# Patient Record
Sex: Male | Born: 1950
Health system: Southern US, Community
[De-identification: ages and names within clinical notes are randomized; demographics above are authoritative.]

## PROBLEM LIST (undated history)

## (undated) DIAGNOSIS — Z8041 Family history of malignant neoplasm of ovary: Secondary | ICD-10-CM

## (undated) DIAGNOSIS — R17 Unspecified jaundice: Secondary | ICD-10-CM

## (undated) DIAGNOSIS — M25569 Pain in unspecified knee: Secondary | ICD-10-CM

## (undated) DIAGNOSIS — G4733 Obstructive sleep apnea (adult) (pediatric): Secondary | ICD-10-CM

## (undated) DIAGNOSIS — T7840XA Allergy, unspecified, initial encounter: Secondary | ICD-10-CM

## (undated) DIAGNOSIS — E119 Type 2 diabetes mellitus without complications: Secondary | ICD-10-CM

## (undated) DIAGNOSIS — I10 Essential (primary) hypertension: Secondary | ICD-10-CM

## (undated) DIAGNOSIS — M179 Osteoarthritis of knee, unspecified: Secondary | ICD-10-CM

## (undated) DIAGNOSIS — H269 Unspecified cataract: Secondary | ICD-10-CM

## (undated) DIAGNOSIS — I2510 Atherosclerotic heart disease of native coronary artery without angina pectoris: Secondary | ICD-10-CM

## (undated) DIAGNOSIS — R739 Hyperglycemia, unspecified: Secondary | ICD-10-CM

## (undated) DIAGNOSIS — I2584 Coronary atherosclerosis due to calcified coronary lesion: Principal | ICD-10-CM

## (undated) DIAGNOSIS — G4730 Sleep apnea, unspecified: Secondary | ICD-10-CM

## (undated) DIAGNOSIS — D120 Benign neoplasm of cecum: Secondary | ICD-10-CM

## (undated) DIAGNOSIS — B199 Unspecified viral hepatitis without hepatic coma: Secondary | ICD-10-CM

## (undated) DIAGNOSIS — M1710 Unilateral primary osteoarthritis, unspecified knee: Secondary | ICD-10-CM

## (undated) DIAGNOSIS — E785 Hyperlipidemia, unspecified: Secondary | ICD-10-CM

## (undated) DIAGNOSIS — Q438 Other specified congenital malformations of intestine: Secondary | ICD-10-CM

## (undated) HISTORY — DX: Allergy, unspecified, initial encounter: T78.40XA

## (undated) HISTORY — PX: EYE SURGERY: SHX253

## (undated) HISTORY — DX: Hyperlipidemia, unspecified: E78.5

## (undated) HISTORY — DX: Essential (primary) hypertension: I10

## (undated) HISTORY — DX: Pain in unspecified knee: M25.569

## (undated) HISTORY — DX: Unilateral primary osteoarthritis, unspecified knee: M17.10

## (undated) HISTORY — DX: Hyperglycemia, unspecified: R73.9

## (undated) HISTORY — DX: Unspecified cataract: H26.9

## (undated) HISTORY — DX: Atherosclerotic heart disease of native coronary artery without angina pectoris: I25.10

## (undated) HISTORY — DX: Osteoarthritis of knee, unspecified: M17.9

## (undated) HISTORY — DX: Family history of malignant neoplasm of ovary: Z80.41

## (undated) HISTORY — DX: Morbid (severe) obesity due to excess calories: E66.01

## (undated) HISTORY — DX: Benign neoplasm of cecum: D12.0

## (undated) HISTORY — DX: Type 2 diabetes mellitus without complications: E11.9

## (undated) HISTORY — DX: Coronary atherosclerosis due to calcified coronary lesion: I25.84

## (undated) HISTORY — DX: Obstructive sleep apnea (adult) (pediatric): G47.33

## (undated) HISTORY — PX: TONSILLECTOMY: SUR1361

## (undated) HISTORY — DX: Unspecified viral hepatitis without hepatic coma: B19.9

## (undated) HISTORY — DX: Other specified congenital malformations of intestine: Q43.8

## (undated) HISTORY — PX: JOINT REPLACEMENT: SHX530

---

## 1996-02-23 HISTORY — PX: CHOLECYSTECTOMY: SHX55

## 1999-11-20 ENCOUNTER — Encounter: Payer: Self-pay | Admitting: Family Medicine

## 2000-11-04 ENCOUNTER — Encounter: Payer: Self-pay | Admitting: Family Medicine

## 2002-03-16 ENCOUNTER — Encounter: Payer: Self-pay | Admitting: Family Medicine

## 2002-03-16 LAB — CONVERTED CEMR LAB
Blood Glucose, Fasting: 109 mg/dL
PSA: 0.7 ng/mL
RBC count: 5.4 10*6/uL
TSH: 1.48 microintl units/mL

## 2002-04-02 HISTORY — PX: OTHER SURGICAL HISTORY: SHX169

## 2005-01-13 ENCOUNTER — Ambulatory Visit: Payer: Self-pay | Admitting: Family Medicine

## 2005-02-12 ENCOUNTER — Ambulatory Visit: Payer: Self-pay | Admitting: Family Medicine

## 2005-02-12 LAB — CONVERTED CEMR LAB: PSA: 0.84 ng/mL

## 2005-02-17 ENCOUNTER — Ambulatory Visit: Payer: Self-pay | Admitting: Family Medicine

## 2006-01-12 ENCOUNTER — Ambulatory Visit: Payer: Self-pay | Admitting: Family Medicine

## 2006-04-08 ENCOUNTER — Ambulatory Visit: Payer: Self-pay | Admitting: Family Medicine

## 2007-05-23 ENCOUNTER — Ambulatory Visit: Payer: Self-pay | Admitting: Family Medicine

## 2007-05-23 DIAGNOSIS — D239 Other benign neoplasm of skin, unspecified: Secondary | ICD-10-CM | POA: Insufficient documentation

## 2007-05-25 ENCOUNTER — Encounter: Payer: Self-pay | Admitting: Family Medicine

## 2007-06-21 ENCOUNTER — Ambulatory Visit: Payer: Self-pay | Admitting: Family Medicine

## 2007-06-21 DIAGNOSIS — T148XXA Other injury of unspecified body region, initial encounter: Secondary | ICD-10-CM | POA: Insufficient documentation

## 2007-06-30 ENCOUNTER — Ambulatory Visit: Payer: Self-pay | Admitting: Family Medicine

## 2008-02-09 ENCOUNTER — Ambulatory Visit: Payer: Self-pay | Admitting: Family Medicine

## 2008-02-09 LAB — CONVERTED CEMR LAB
ALT: 29 units/L (ref 0–53)
Bilirubin, Direct: 0.1 mg/dL (ref 0.0–0.3)
CO2: 30 meq/L (ref 19–32)
Calcium: 9.2 mg/dL (ref 8.4–10.5)
Chloride: 103 meq/L (ref 96–112)
GFR calc non Af Amer: 92 mL/min
LDL Cholesterol: 85 mg/dL (ref 0–99)
Sodium: 139 meq/L (ref 135–145)
Total Bilirubin: 0.8 mg/dL (ref 0.3–1.2)
Total CHOL/HDL Ratio: 3.2

## 2008-02-15 ENCOUNTER — Ambulatory Visit: Payer: Self-pay | Admitting: Family Medicine

## 2008-02-15 HISTORY — DX: Morbid (severe) obesity due to excess calories: E66.01

## 2008-08-02 ENCOUNTER — Ambulatory Visit: Payer: Self-pay | Admitting: Family Medicine

## 2008-08-02 DIAGNOSIS — M7520 Bicipital tendinitis, unspecified shoulder: Secondary | ICD-10-CM | POA: Insufficient documentation

## 2008-08-02 DIAGNOSIS — M719 Bursopathy, unspecified: Secondary | ICD-10-CM

## 2008-08-02 DIAGNOSIS — M67919 Unspecified disorder of synovium and tendon, unspecified shoulder: Secondary | ICD-10-CM | POA: Insufficient documentation

## 2008-08-02 DIAGNOSIS — M25519 Pain in unspecified shoulder: Secondary | ICD-10-CM | POA: Insufficient documentation

## 2008-08-12 ENCOUNTER — Encounter: Payer: Self-pay | Admitting: Family Medicine

## 2008-09-06 ENCOUNTER — Ambulatory Visit: Payer: Self-pay | Admitting: Family Medicine

## 2009-09-30 ENCOUNTER — Encounter (INDEPENDENT_AMBULATORY_CARE_PROVIDER_SITE_OTHER): Payer: Self-pay | Admitting: *Deleted

## 2010-03-24 NOTE — Letter (Signed)
Summary: Dennis Ayers letter  [place] [facility]  [address] [place], [place] [address]   Phone: [phone]  Fax: [phone]       09/30/2009 MRN: [id number]  Dennis Ayers [address] [place], [place]  [address]  Dear [name] [facility] [place], and [place] announce the retirement of Dennis Ayers, M.D., from full-time practice at the [facility] office effective August 21, 2009 and his plans of returning part-time.  It is important to Dennis Ayers and to our practice that you understand that [facility] has seven physicians in our office for your health care needs.  We will continue to offer the same exceptional care that you have today.    Dennis Ayers has spoken to many of you about his plans for retirement and returning part-time in the fall.   We will continue to work with you through the transition to schedule appointments for you in the office and meet the high standards that [facility] is committed to.   Again, it is with great pleasure that we share the news that Dennis Ayers will return to [facility] in October of 2011 with a reduced schedule.    If you have any questions, or would like to request an appointment with one of our physicians, please call us at [phone] and press the option for Scheduling an appointment.  We take pleasure in providing you with excellent patient care and look forward to seeing you at your next office visit.  Our [facility] are:  Dennis Ayers, M.D. Dennis Ayers, M.D. Dennis Ayers, M.D. Dennis Ayers, M.D. Dennis Ayers, M.D. Dennis Ayers, M.D. We proudly welcomed Dennis Ayers, M.D. and Dennis Ayers, M.D. to the practice in July/August 2011.  Sincerely,  [place] [facility]

## 2011-07-23 ENCOUNTER — Ambulatory Visit (INDEPENDENT_AMBULATORY_CARE_PROVIDER_SITE_OTHER): Payer: 59 | Admitting: Family Medicine

## 2011-07-23 ENCOUNTER — Encounter: Payer: Self-pay | Admitting: Family Medicine

## 2011-07-23 VITALS — BP 130/74 | HR 72 | Temp 97.9°F | Ht 75.0 in | Wt 353.0 lb

## 2011-07-23 DIAGNOSIS — Z0000 Encounter for general adult medical examination without abnormal findings: Secondary | ICD-10-CM

## 2011-07-23 DIAGNOSIS — R2232 Localized swelling, mass and lump, left upper limb: Secondary | ICD-10-CM

## 2011-07-23 DIAGNOSIS — M25569 Pain in unspecified knee: Secondary | ICD-10-CM

## 2011-07-23 DIAGNOSIS — M179 Osteoarthritis of knee, unspecified: Secondary | ICD-10-CM

## 2011-07-23 DIAGNOSIS — Z125 Encounter for screening for malignant neoplasm of prostate: Secondary | ICD-10-CM

## 2011-07-23 DIAGNOSIS — Z1211 Encounter for screening for malignant neoplasm of colon: Secondary | ICD-10-CM

## 2011-07-23 DIAGNOSIS — M1710 Unilateral primary osteoarthritis, unspecified knee: Secondary | ICD-10-CM | POA: Insufficient documentation

## 2011-07-23 DIAGNOSIS — M25562 Pain in left knee: Secondary | ICD-10-CM

## 2011-07-23 DIAGNOSIS — Z2911 Encounter for prophylactic immunotherapy for respiratory syncytial virus (RSV): Secondary | ICD-10-CM

## 2011-07-23 DIAGNOSIS — M25561 Pain in right knee: Secondary | ICD-10-CM

## 2011-07-23 HISTORY — DX: Osteoarthritis of knee, unspecified: M17.9

## 2011-07-23 LAB — COMPREHENSIVE METABOLIC PANEL
Albumin: 4.2 g/dL (ref 3.5–5.2)
Alkaline Phosphatase: 56 U/L (ref 39–117)
BUN: 17 mg/dL (ref 6–23)
GFR: 87.58 mL/min (ref >60.00)
Glucose, Bld: 106 mg/dL — ABNORMAL HIGH (ref 70–99)
Potassium: 4.9 mEq/L (ref 3.5–5.1)

## 2011-07-23 LAB — POC HEMOCCULT BLD/STL (OFFICE/1-CARD/DIAGNOSTIC): Fecal Occult Blood, POC: NEGATIVE

## 2011-07-23 LAB — LIPID PANEL
Cholesterol: 164 mg/dL (ref 0–200)
HDL: 45.4 mg/dL (ref >39.00)
VLDL: 13.6 mg/dL (ref 0.0–40.0)

## 2011-07-23 LAB — PSA: PSA: 1.11 ng/mL (ref 0.10–4.00)

## 2011-07-23 MED ORDER — NAPROXEN 500 MG PO TABS: 500.0000 mg | ORAL_TABLET | Freq: Two times a day (BID) | ORAL | Status: DC | PRN

## 2011-07-23 NOTE — Assessment & Plan Note (Signed)
Anticipate arthritis. Limiting ability to exercise Treat with NSAID.  No h/o kidney disease, no h/o GI intolerance to NSAIDs. If not better return for xray and possible steroid injection.

## 2011-07-23 NOTE — Assessment & Plan Note (Signed)
Reviewed preventative protocols and updated unless pt declined. Shingles shot today. Schedule colonscopy. Prostate exam normal today. Discussed healthy diet and lifestyle.  Encouraged weight loss.

## 2011-07-23 NOTE — Progress Notes (Signed)
Addended by: Josph Macho A on: 07/23/2011 12:27 PM   Modules accepted: Orders

## 2011-07-23 NOTE — Assessment & Plan Note (Signed)
Not bothersome. Discussed likely trigger finger nodule but asxs. Pt would like to monitor for now, will update me if sxs worsening.

## 2011-07-23 NOTE — Progress Notes (Signed)
Subjective:    Patient ID: Dennis Ayers, male    DOB: [birth year], 61 y.o.   MRN: [id number]  HPI CC: CPE  Last seen 2010.  Has knot on left hand.  Not bothering him.  No locking of fingers.  Continued knee pain - takes osteo bi-flex.  Also uses knee salve.  Used to take ibuprofen but not recently.  No instability or locking.  + popping.  L>R pain.  L knee injury 20+ yrs ago water skiing, twisted knee.  Trouble exercising 2/2 knee pain.  No h/o knee surgeries.  Wt Readings from Last 3 Encounters:  07/23/11 353 lb (160.12 kg)  09/06/08 322 lb 9.6 oz (146.33 kg)  08/02/08 333 lb (151.048 kg)   Preventative: No recent CPE Colon screening - would like to schedule colonoscopy Prostate screening - would like today. Fasting today. Tetanus 2004.  Wants shingles shot today.  States has discussed with insurance re shot. Seatbelt and sunscreen use discussed.  Caffeine: 2-3 diet sodas/day Lives with wife 1 grown son. Occupation: Self-employed; Administrator Activity: some golf, limited by knee pain Diet: good water, daily vegetables  Medications and allergies reviewed and updated in chart.  Past histories reviewed and updated if relevant as below. Patient Active Problem List  Diagnoses  . NEVI, MULTIPLE  . OBESITY, UNSPECIFIED  . SHOULDER PAIN, RIGHT  . ROTATOR CUFF SYNDROME, RIGHT  . BICEPS TENDINITIS, RIGHT  . HYPERGLYCEMIA  . LACERATION   Past Medical History  Diagnosis Date  . Bicipital tenosynovitis   . Other abnormal glucose   . Benign neoplasm of skin, site unspecified   . Obesity, unspecified   . Disorders of bursae and tendons in shoulder region, unspecified    Past Surgical History  Procedure Date  . 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 Dorene Grebe  . Cholecystectomy 1998   History  Substance Use Topics  . Smoking status: Never Smoker   . Smokeless tobacco: Never Used  . Alcohol Use: No   Family History  Problem Relation Age of  Onset  . Coronary artery disease Father     CABG X4  . Hypertension Father   . Ovarian cancer Mother   . Ulcers Mother     stomach  . Diabetes Neg Hx   . Prostate cancer Neg Hx   . Breast cancer Neg Hx   . Colon cancer Neg Hx   . Depression Neg Hx   . Alcohol abuse Neg Hx   . Stroke Father    No Known Allergies No current outpatient prescriptions on file prior to visit.    Review of Systems  Constitutional: Negative for fever, chills, activity change, appetite change, fatigue and unexpected weight change.  HENT: Negative for hearing loss and neck pain.   Eyes: Negative for visual disturbance.  Respiratory: Negative for cough, chest tightness, shortness of breath and wheezing.   Cardiovascular: Negative for chest pain, palpitations and leg swelling.  Gastrointestinal: Negative for nausea, vomiting, abdominal pain, diarrhea, constipation, blood in stool and abdominal distention.  Genitourinary: Negative for hematuria and difficulty urinating.  Musculoskeletal: Negative for myalgias and arthralgias.  Skin: Negative for rash.  Neurological: Negative for dizziness, seizures, syncope and headaches.  Hematological: Does not bruise/bleed easily.  Psychiatric/Behavioral: Negative for dysphoric mood. The patient is not nervous/anxious.        Objective:   Physical Exam  Nursing note and vitals reviewed. Constitutional: He is oriented to person, place, and time. He appears well-developed and well-nourished.  No distress.       obese  HENT:  Head: Normocephalic and atraumatic.  Right Ear: Hearing, tympanic membrane, external ear and ear canal normal.  Left Ear: Hearing, tympanic membrane, external ear and ear canal normal.  Nose: Nose normal. No mucosal edema or rhinorrhea.  Mouth/Throat: Uvula is midline, oropharynx is clear and moist and mucous membranes are normal. No oropharyngeal exudate, posterior oropharyngeal edema, posterior oropharyngeal erythema or tonsillar abscesses.    Eyes: Conjunctivae and EOM are normal. Pupils are equal, round, and reactive to light. No scleral icterus.  Neck: Normal range of motion. Neck supple. No thyromegaly present.  Cardiovascular: Normal rate, regular rhythm, normal heart sounds and intact distal pulses.   No murmur heard. Pulses:      Radial pulses are 2+ on the right side, and 2+ on the left side.  Pulmonary/Chest: Effort normal and breath sounds normal. No respiratory distress. He has no wheezes. He has no rales.  Abdominal: Soft. Bowel sounds are normal. He exhibits no distension and no mass. There is no tenderness. There is no rebound and no guarding.  Musculoskeletal: Normal range of motion. He exhibits no edema.       L palmar hand with nontender nodule at 3rd flexor tendon  Bilateral knees FROM, no significant crepitus, no effusion, no pain to palpation at ligaments or joint line. Neg mcmurray.  Lymphadenopathy:    He has no cervical adenopathy.  Neurological: He is alert and oriented to person, place, and time.       CN grossly intact, station and gait intact  Skin: Skin is warm and dry. No rash noted.  Psychiatric: He has a normal mood and affect. His behavior is normal. Judgment and thought content normal.      Assessment & Plan:

## 2011-07-23 NOTE — Patient Instructions (Signed)
Shingles shot today. Pass by [facility]'s office to schedule colonosocpy. You likely have arthritis in knees. Take naprosyn as needed for pain - take with food. Blood work today.

## 2011-08-06 ENCOUNTER — Ambulatory Visit (AMBULATORY_SURGERY_CENTER): Payer: 59

## 2011-08-06 VITALS — Ht 75.0 in | Wt 357.8 lb

## 2011-08-06 DIAGNOSIS — Z1211 Encounter for screening for malignant neoplasm of colon: Secondary | ICD-10-CM

## 2011-08-06 MED ORDER — MOVIPREP 100 G PO SOLR: 1.0000 | Freq: Once | ORAL | Status: DC

## 2011-08-09 ENCOUNTER — Encounter: Payer: Self-pay | Admitting: Internal Medicine

## 2011-08-19 ENCOUNTER — Other Ambulatory Visit: Payer: Self-pay

## 2011-08-20 ENCOUNTER — Encounter (HOSPITAL_COMMUNITY): Payer: Self-pay | Admitting: *Deleted

## 2011-08-20 ENCOUNTER — Ambulatory Visit (HOSPITAL_COMMUNITY)
Admission: RE | Admit: 2011-08-20 | Discharge: 2011-08-20 | Disposition: A | Discharge status: Home / Self Care | Payer: 59 | Source: Ambulatory Visit | Attending: Internal Medicine | Admitting: Internal Medicine

## 2011-08-20 ENCOUNTER — Encounter: Payer: 59 | Admitting: Internal Medicine

## 2011-08-20 ENCOUNTER — Encounter (HOSPITAL_COMMUNITY)
Admission: RE | Disposition: A | Discharge status: Home / Self Care | Payer: Self-pay | Source: Ambulatory Visit | Attending: Internal Medicine

## 2011-08-20 DIAGNOSIS — G4730 Sleep apnea, unspecified: Secondary | ICD-10-CM | POA: Insufficient documentation

## 2011-08-20 DIAGNOSIS — Z860100 Personal history of colon polyps, unspecified: Secondary | ICD-10-CM | POA: Diagnosis present

## 2011-08-20 DIAGNOSIS — Z1211 Encounter for screening for malignant neoplasm of colon: Secondary | ICD-10-CM

## 2011-08-20 DIAGNOSIS — D126 Benign neoplasm of colon, unspecified: Secondary | ICD-10-CM | POA: Diagnosis present

## 2011-08-20 HISTORY — DX: Sleep apnea, unspecified: G47.30

## 2011-08-20 HISTORY — DX: Unspecified jaundice: R17

## 2011-08-20 HISTORY — PX: COLONOSCOPY: SHX5424

## 2011-08-20 SURGERY — COLONOSCOPY
Anesthesia: Moderate Sedation

## 2011-08-20 MED ORDER — MIDAZOLAM HCL 10 MG/2ML IJ SOLN
INTRAMUSCULAR | Status: AC
  Filled 2011-08-20: qty 4

## 2011-08-20 MED ORDER — FENTANYL CITRATE 0.05 MG/ML IJ SOLN
INTRAMUSCULAR | Status: DC | PRN
  Administered 2011-08-20 (×2): 25 ug via INTRAVENOUS

## 2011-08-20 MED ORDER — MIDAZOLAM HCL 10 MG/2ML IJ SOLN
INTRAMUSCULAR | Status: DC | PRN
  Administered 2011-08-20 (×2): 2 mg via INTRAVENOUS

## 2011-08-20 MED ORDER — DIPHENHYDRAMINE HCL 50 MG/ML IJ SOLN
INTRAMUSCULAR | Status: AC
  Filled 2011-08-20: qty 1

## 2011-08-20 MED ORDER — SODIUM CHLORIDE 0.9 % IV SOLN
INTRAVENOUS | Status: DC
  Administered 2011-08-20: 500 mL via INTRAVENOUS

## 2011-08-20 MED ORDER — FENTANYL CITRATE 0.05 MG/ML IJ SOLN
INTRAMUSCULAR | Status: AC
  Filled 2011-08-20: qty 4

## 2011-08-20 NOTE — Op Note (Signed)
[facility] [address] [place], [place]  [address]  COLONOSCOPY PROCEDURE REPORT  PATIENT:  Dennis Ayers, Dennis Ayers  MR#:  [id number] BIRTHDATE:  11/04/1950, 60 yrs. old  GENDER:  male ENDOSCOPIST:  Iva Boop, MD, [facility] REF. BY:  Eustaquio Boyden, M.D. PROCEDURE DATE:  08/20/2011 PROCEDURE:  Colonoscopy with snare polypectomy ASA CLASS:  Class III INDICATIONS:  Routine Risk Screening, Screening MEDICATIONS:   Fentanyl 50 mcg IV, Versed 4 mg IV  DESCRIPTION OF PROCEDURE:   After the risks benefits and alternatives of the procedure were thoroughly explained, informed consent was obtained.  Digital rectal exam was performed and revealed no abnormalities and normal prostate.   The Pentax Colonoscope [id number] endoscope was introduced through the anus and advanced to the cecum, which was identified by both the appendix and ileocecal valve, without limitations.  The quality of the prep was good, using MoviPrep.  The instrument was then slowly withdrawn as the colon was fully examined. <<PROCEDUREIMAGES>>  FINDINGS:  Four polyps were found in the transverse colon. All diminutive,, 3-5 mm and removed with cold snare, sent to pathology.  This was otherwise a normal examination of the colon. Retroflexed views in the rectum revealed no abnormalities.    The time to cecum = 17 (required position change to prone after supine and multiple pressure applications failed) minutes. The scope was then withdrawn in 11 minutes from the cecum and the procedure completed. COMPLICATIONS:  None ENDOSCOPIC IMPRESSION: 1) Four diminutive polyps removed 2) Otherwise normal exam, good prep  REPEAT EXAM:  In for Colonoscopy, pending biopsy results.  Iva Boop, MD, [facility]  CC:  Eustaquio Boyden MD and The Patient  n. eSIGNED:   Iva Boop at 08/20/2011 01:23 PM  Sudie Bailey, [phone]

## 2011-08-20 NOTE — Discharge Instructions (Addendum)
There were four small and benign-appearing polyps removed. I will send you a letter about what type of polyps they were and what that means as far as next routine screening test (timing). Thanks for choosing [facility].  Iva Boop, MD, [facility]   Colonoscopy Care After Read the instructions outlined below and refer to this sheet in the next few weeks. These discharge instructions provide you with general information on caring for yourself after you leave the hospital. Your doctor may also give you specific instructions. While your treatment has been planned according to the most current medical practices available, unavoidable complications occasionally occur. If you have any problems or questions after discharge, call your doctor. HOME CARE INSTRUCTIONS ACTIVITY:  You may resume your regular activity, but move at a slower pace for the next 24 hours.   Take frequent rest periods for the next 24 hours.   Walking will help get rid of the air and reduce the bloated feeling in your belly (abdomen).   No driving for 24 hours (because of the medicine (anesthesia) used during the test).   You may shower.   Do not sign any important legal documents or operate any machinery for 24 hours (because of the anesthesia used during the test).  NUTRITION:  Drink plenty of fluids.   You may resume your normal diet as instructed by your doctor.   Begin with a light meal and progress to your normal diet. Heavy or fried foods are harder to digest and may make you feel sick to your stomach (nauseated).   Avoid alcoholic beverages for 24 hours or as instructed.  MEDICATIONS:  You may resume your normal medications unless your doctor tells you otherwise.  WHAT TO EXPECT TODAY:  Some feelings of bloating in the abdomen.   Passage of more gas than usual.   Spotting of blood in your stool or on the toilet paper.  IF YOU HAD POLYPS REMOVED DURING THE COLONOSCOPY:  No aspirin products  for 7 days or as instructed.   No alcohol for 7 days or as instructed.   Eat a soft diet for the next 24 hours.  FINDING OUT THE RESULTS OF YOUR TEST Not all test results are available during your visit. If your test results are not back during the visit, make an appointment with your caregiver to find out the results. Do not assume everything is normal if you have not heard from your caregiver or the medical facility. It is important for you to follow up on all of your test results.  SEEK IMMEDIATE MEDICAL CARE IF:  You have more than a spotting of blood in your stool.   Your belly is swollen (abdominal distention).   You are nauseated or vomiting.   You have a fever.   You have abdominal pain or discomfort that is severe or gets worse throughout the day.  Document Released: 09/23/2003 Document Revised: 01/28/2011 Document Reviewed: 09/21/2007 [facility] Patient Information 2012 [place], [place].

## 2011-08-20 NOTE — H&P (Signed)
  Cc:  Screening colonoscopy  HPI:  No active GI complaints  No Known Allergies @ENCMEDSTART @ Past Medical History  Diagnosis Date  . Hyperglycemia   . Benign neoplasm of skin, site unspecified   . Morbid obesity   . Knee pain   . Jaundice     when very young, treated and resolved  . Hepatitis     has had jaundice in past, unsure of cause  . Sleep apnea     no CiPAP   Past Surgical History  Procedure Date  . 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 Dorene Grebe  . Cholecystectomy 1998  . Tonsillectomy    History   Social History  . Marital Status: Married    Spouse Name: N/A    Number of Children: 1  . Years of Education: N/A   Occupational History  . Self-employed    Social History Main Topics  . Smoking status: Never Smoker   . Smokeless tobacco: Never Used  . Alcohol Use: No     occasionally in past, not anymore for 20 years  . Drug Use: No  . Sexually Active: None   Other Topics Concern  . None   Social History Narrative   Caffeine: 2-3 diet sodas/dayLives with wife1 grown son.Occupation: Self-employed; Financial risk analyst: some golf, limited by knee painDiet: good water, daily vegetables   Family History  Problem Relation Age of Onset  . Coronary artery disease Father     CABG X4  . Hypertension Father   . Ovarian cancer Mother   . Ulcers Mother     stomach  . Diabetes Neg Hx   . Prostate cancer Neg Hx   . Breast cancer Neg Hx   . Colon cancer Neg Hx   . Depression Neg Hx   . Alcohol abuse Neg Hx   . Stroke Father       PE:  WDWN, obese abdomen Lungs are clear Heart normal S1S2 no murmur Abd is soft obese and nontender Ext no edema  A and O x 3  Ass/plan:  Routine risk screening colonoscopy - for colorectal cancer  The risks, benefits, and alternatives to endoscopy with possible biopsy and possible dilation were discussed with the patient and they consent to proceed.

## 2011-08-24 ENCOUNTER — Encounter: Payer: Self-pay | Admitting: Family Medicine

## 2011-08-25 ENCOUNTER — Encounter: Payer: Self-pay | Admitting: Internal Medicine

## 2011-09-21 ENCOUNTER — Other Ambulatory Visit: Payer: Self-pay | Admitting: Family Medicine

## 2011-11-19 ENCOUNTER — Ambulatory Visit (INDEPENDENT_AMBULATORY_CARE_PROVIDER_SITE_OTHER)
Admission: RE | Admit: 2011-11-19 | Discharge: 2011-11-19 | Disposition: A | Discharge status: Home / Self Care | Payer: 59 | Source: Ambulatory Visit | Attending: Family Medicine | Admitting: Family Medicine

## 2011-11-19 ENCOUNTER — Ambulatory Visit (INDEPENDENT_AMBULATORY_CARE_PROVIDER_SITE_OTHER): Payer: 59 | Admitting: Family Medicine

## 2011-11-19 ENCOUNTER — Encounter: Payer: Self-pay | Admitting: Family Medicine

## 2011-11-19 VITALS — BP 136/76 | HR 85 | Temp 97.6°F | Wt 357.0 lb

## 2011-11-19 DIAGNOSIS — M25529 Pain in unspecified elbow: Secondary | ICD-10-CM

## 2011-11-19 DIAGNOSIS — M25569 Pain in unspecified knee: Secondary | ICD-10-CM

## 2011-11-19 DIAGNOSIS — M1710 Unilateral primary osteoarthritis, unspecified knee: Secondary | ICD-10-CM

## 2011-11-19 DIAGNOSIS — M25522 Pain in left elbow: Secondary | ICD-10-CM

## 2011-11-19 DIAGNOSIS — M25562 Pain in left knee: Secondary | ICD-10-CM

## 2011-11-19 MED ORDER — NAPROXEN 500 MG PO TABS: 500.0000 mg | ORAL_TABLET | Freq: Two times a day (BID) | ORAL | Status: DC

## 2011-11-19 NOTE — Assessment & Plan Note (Addendum)
Anticipate OA - xrays today given longstanding knee pain to eval arthritic burden  --> medial compartment degeneration evident on my read. Discussed options - pt agrees to steroid injection Handicap placard (temporary) filled out 2/2 inability to walk more than 200 ft without stopping to rest.  IC obtained and in chart.  Area cleaned with alcohol pad and ethyl chloride used to anesthetize area.  Using lateral approach with knee flexed, 4cc of 1:4 depo medrol/lidocaine 1% injected into knee using 1 1/2 inch 22g needle.  Pt tolerated procedure well.  Discussed red flags to seek care over weekend after steroid injection.

## 2011-11-19 NOTE — Progress Notes (Signed)
  Subjective:    Patient ID: Dennis Ayers, male    DOB: 04-02-1950, 62 y.o.   MRN: [id number]  HPI CC: knee pain, elbow pain  Longstanding h/o knee pain - recently worsening.  takes osteo bi-flex. Also uses knee salve. Used to take ibuprofen but not recently. No instability or locking. + popping. L>R pain. L knee injury 20+ yrs ago water skiing, twisted knee. Trouble exercising 2/2 knee pain.  Mild swelling.  Pain anterior upper knee.  No h/o knee surgeries. No fevers/chills, redness or warmth of joint. Naprosyn started, some improvement but still pain present. Osteo biflex hasn't really helped.  L elbow pain - at tip of elbow.  Very sore to touch.  Naprosyn did help some.  No significant burning or numbness of arm.  Past Medical History  Diagnosis Date  . Hyperglycemia   . Benign neoplasm of skin, site unspecified   . Morbid obesity   . Knee pain   . Jaundice     when very young, treated and resolved  . Hepatitis     has had jaundice in past, unsure of cause  . Sleep apnea     no CiPAP   There is no height on file to calculate BMI.  Wt Readings from Last 3 Encounters:  11/19/11 357 lb (161.934 kg)  08/20/11 350 lb (158.759 kg)  08/20/11 350 lb (158.759 kg)    Review of Systems Per HPI    Objective:   Physical Exam  Nursing note and vitals reviewed. Constitutional: He appears well-developed and well-nourished. No distress.  Musculoskeletal: He exhibits no edema.       L elbow - tender to palpation at lat epicondyle.  Tender with resisted extension against resistance at wrist.  Tender with forced pronation at wrist. L knee - tender to palpation medial joint line of knee.  Neg drawer test, no pain with Mcmurray's, no PFgrind or abnormal patellar mobility.  + crepitus with ext/flexion.  FROM at knee. No popliteal fullness. R knee - mildly tender to palpation medial joint line but not as significant as left knee.  Skin: Skin is warm and dry. No rash noted.         Assessment & Plan:

## 2011-11-19 NOTE — Patient Instructions (Addendum)
Knee - steroid injection provided today.  I do think you have arthritis , worse on the inside of left knee.  Watch for redness or warmth or worsening pain after steroid shot - if that happens, seek urgent care. Tennis elbow - recommend stretching exercises as provided.  No heavy lifting for next several weeks - rest elbow.  May use naprosyn as prescribed for inflammation.  Buy elbow strap.  Osteoarthritis Osteoarthritis is the most common form of arthritis. It is redness, soreness, and swelling (inflammation) affecting the cartilage. Cartilage acts as a cushion, covering the ends of bones where they meet to form a joint. CAUSES  Over time, the cartilage begins to wear away. This causes bone to rub on bone. This produces pain and stiffness in the affected joints. Factors that contribute to this problem are:  Excessive body weight.   Age.   Overuse of joints.  SYMPTOMS   People with osteoarthritis usually experience joint pain, swelling, or stiffness.   Over time, the joint may lose its normal shape.   Small deposits of bone (osteophytes) may grow on the edges of the joint.   Bits of bone or cartilage can break off and float inside the joint space. This may cause more pain and damage.   Osteoarthritis can lead to depression, anxiety, feelings of helplessness, and limitations on daily activities.  The most commonly affected joints are in the:  Ends of the fingers.   Thumbs.   Neck.   Lower back.   Knees.   Hips.  DIAGNOSIS  Diagnosis is mostly based on your symptoms and exam. Tests may be helpful, including:  X-rays of the affected joint.   A computerized magnetic scan (MRI).   Blood tests to rule out other types of arthritis.   Joint fluid tests. This involves using a needle to draw fluid from the joint and examining the fluid under a microscope.  TREATMENT  Goals of treatment are to control pain, improve joint function, maintain a normal body weight, and maintain a  healthy lifestyle. Treatment approaches may include:  A prescribed exercise program with rest and joint relief.   Weight control with nutritional education.   Pain relief techniques such as:   Properly applied heat and cold.   Electric pulses delivered to nerve endings under the skin (transcutaneous electrical nerve stimulation, TENS).   Massage.   Certain supplements. Ask your caregiver before using any supplements, especially in combination with prescribed drugs.   Medicines to control pain, such as:   Acetaminophen.   Nonsteroidal anti-inflammatory drugs (NSAIDs), such as naproxen.   Narcotic or central-acting agents, such as tramadol. This drug carries a risk of addiction and is generally prescribed for short-term use.   Corticosteroids. These can be given orally or as injection. This is a short-term treatment, not recommended for routine use.   Surgery to reposition the bones and relieve pain (osteotomy) or to remove loose pieces of bone and cartilage. Joint replacement may be needed in advanced states of osteoarthritis.  HOME CARE INSTRUCTIONS  Your caregiver can recommend specific types of exercise. These may include:  Strengthening exercises. These are done to strengthen the muscles that support joints affected by arthritis. They can be performed with weights or with exercise bands to add resistance.   Aerobic activities. These are exercises, such as brisk walking or low-impact aerobics, that get your heart pumping. They can help keep your lungs and circulatory system in shape.   Range-of-motion activities. These keep your joints limber.  Balance and agility exercises. These help you maintain daily living skills.  Learning about your condition and being actively involved in your care will help improve the course of your osteoarthritis. SEEK MEDICAL CARE IF:   You feel hot or your skin turns red.   You develop a rash in addition to your joint pain.   You have an  oral temperature above 102 F (38.9 C).  FOR MORE INFORMATION  National Institute of Arthritis and Musculoskeletal and Skin Diseases: www.niams.http://www.myers.net/ [employer] on Aging: https://walker.com/ American College of Rheumatology: www.rheumatology.org Document Released: 02/08/2005 Document Revised: 01/28/2011 Document Reviewed: 05/22/2009 [facility] Patient Information 2012 [place], [place].

## 2011-11-19 NOTE — Assessment & Plan Note (Addendum)
Anticipate lateral epicondylitis. Treat with NSAID, discussed buying elbow strap, and provided with stretching exercises from [facility] pt advisor on lateral epicondylitis. Discussed avoidance of elbow/wrist/forearm repetitive motions.

## 2012-01-12 ENCOUNTER — Other Ambulatory Visit: Payer: Self-pay | Admitting: Family Medicine

## 2012-01-13 ENCOUNTER — Encounter: Payer: Self-pay | Admitting: Family Medicine

## 2012-01-13 ENCOUNTER — Ambulatory Visit (INDEPENDENT_AMBULATORY_CARE_PROVIDER_SITE_OTHER): Payer: 59 | Admitting: Family Medicine

## 2012-01-13 VITALS — BP 146/80 | HR 96 | Temp 98.0°F | Wt 364.0 lb

## 2012-01-13 DIAGNOSIS — M1710 Unilateral primary osteoarthritis, unspecified knee: Secondary | ICD-10-CM

## 2012-01-13 MED ORDER — TRAMADOL HCL 50 MG PO TABS: 50.0000 mg | ORAL_TABLET | Freq: Two times a day (BID) | ORAL | Status: DC | PRN

## 2012-01-13 NOTE — Patient Instructions (Signed)
Good to see you today Pass by [facility]'s office to schedule ortho referral. May use naprosyn only if helping.  May try tramadol for pain.

## 2012-01-13 NOTE — Progress Notes (Signed)
  Subjective:    Patient ID: Dennis Ayers, male    DOB: [birth year], 61 y.o.   MRN: [id number]  HPI CC: f/u knee  See prior note for details.  Seen here 11/19/2011 with L DJD.  Treated with steroid injection of depo medrol/lidocaine 1%.  Significant pain relief but only for 2 weeks.  Also prescribed naprosyn 500mg  bid.  Has been taking regularly, has not noticed significant different.  Planning on significant traveling upcoming for work, wants to have improvement.  L knee xray -  IMPRESSION:  Degenerative osteoarthritic changes are present. Severe narrowing of medial joint space with sclerosis of cortical and subcortical bone in the adjacent medial tibial plateau area. Degenerative marginal osteophytes are seen. No erosive changes are evident.  Wt Readings from Last 3 Encounters:  01/13/12 364 lb (165.109 kg)  11/19/11 357 lb (161.934 kg)  08/20/11 350 lb (158.759 kg)    Past Medical History  Diagnosis Date  . Hyperglycemia   . Benign neoplasm of skin, site unspecified   . Morbid obesity   . Knee pain   . Jaundice     when very young, treated and resolved  . Hepatitis     has had jaundice in past, unsure of cause  . Sleep apnea     no CiPAP  . DJD (degenerative joint disease) of knee     Past Surgical History  Procedure Date  . 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 Dorene Grebe  . Cholecystectomy 1998  . Tonsillectomy   . Colonoscopy 08/20/2011    Procedure: COLONOSCOPY;  Surgeon: Iva Boop, MD;  Location: [facility];  Service: Endoscopy - adenomatous polyps found   History  Substance Use Topics  . Smoking status: Never Smoker   . Smokeless tobacco: Never Used  . Alcohol Use: No     Comment: occasionally in past, not anymore for 20 years    Review of Systems Per HPI    Objective:   Physical Exam  Nursing note and vitals reviewed. Constitutional: He appears well-developed and well-nourished. No distress.       obese  Musculoskeletal: He exhibits no edema.      L knee - significant tender to palpation medial joint line of knee.  + crepitus with ext/flexion.  effusion present.  FROM at knee. R knee - mildly tender to palpation medial joint line but not as significant as left knee.  Skin: Skin is warm and dry. No rash noted.       Assessment & Plan:

## 2012-01-13 NOTE — Assessment & Plan Note (Signed)
Significant medial compartment DJD of L knee.  Only 2 wk relief after steroid injection. Will refer to ortho for further eval (hyaluronic acid vs surgery discussion). Unsure if naprosyn helping.  Will add tramadol for pain.  Discussed precautions with tramadol. Pt agrees with plan.

## 2012-06-26 ENCOUNTER — Encounter: Payer: Self-pay | Admitting: Family Medicine

## 2012-07-05 ENCOUNTER — Ambulatory Visit (INDEPENDENT_AMBULATORY_CARE_PROVIDER_SITE_OTHER): Payer: 59 | Admitting: Family Medicine

## 2012-07-05 ENCOUNTER — Ambulatory Visit (INDEPENDENT_AMBULATORY_CARE_PROVIDER_SITE_OTHER): Payer: 59 | Admitting: Pulmonary Disease

## 2012-07-05 ENCOUNTER — Encounter: Payer: Self-pay | Admitting: Family Medicine

## 2012-07-05 ENCOUNTER — Encounter: Payer: Self-pay | Admitting: Pulmonary Disease

## 2012-07-05 ENCOUNTER — Ambulatory Visit (INDEPENDENT_AMBULATORY_CARE_PROVIDER_SITE_OTHER)
Admission: RE | Admit: 2012-07-05 | Discharge: 2012-07-05 | Disposition: A | Discharge status: Home / Self Care | Payer: 59 | Source: Ambulatory Visit | Attending: Family Medicine | Admitting: Family Medicine

## 2012-07-05 VITALS — BP 142/88 | HR 64 | Temp 98.1°F | Wt 352.5 lb

## 2012-07-05 VITALS — BP 144/84 | HR 95 | Ht 75.5 in | Wt 357.4 lb

## 2012-07-05 DIAGNOSIS — G4730 Sleep apnea, unspecified: Secondary | ICD-10-CM

## 2012-07-05 DIAGNOSIS — G4733 Obstructive sleep apnea (adult) (pediatric): Secondary | ICD-10-CM | POA: Insufficient documentation

## 2012-07-05 DIAGNOSIS — Z01818 Encounter for other preprocedural examination: Secondary | ICD-10-CM

## 2012-07-05 DIAGNOSIS — I517 Cardiomegaly: Secondary | ICD-10-CM

## 2012-07-05 DIAGNOSIS — D751 Secondary polycythemia: Secondary | ICD-10-CM | POA: Insufficient documentation

## 2012-07-05 LAB — CBC WITH DIFFERENTIAL/PLATELET
Basophils Absolute: 0 10*3/uL (ref 0.0–0.1)
Eosinophils Absolute: 0.2 10*3/uL (ref 0.0–0.7)
Hemoglobin: 17.4 g/dL — ABNORMAL HIGH (ref 13.0–17.0)
Lymphocytes Relative: 36.7 % (ref 12.0–46.0)
Monocytes Relative: 9.5 % (ref 3.0–12.0)
Neutro Abs: 3.3 10*3/uL (ref 1.4–7.7)
Neutrophils Relative %: 50.1 % (ref 43.0–77.0)
RBC: 5.4 Mil/uL (ref 4.22–5.81)
RDW: 13.2 % (ref 11.5–14.6)

## 2012-07-05 LAB — PROTIME-INR
INR: 1.1 ratio — ABNORMAL HIGH (ref 0.8–1.0)
Prothrombin Time: 11.3 s (ref 10.2–12.4)

## 2012-07-05 LAB — BASIC METABOLIC PANEL
Calcium: 9.6 mg/dL (ref 8.4–10.5)
Creatinine, Ser: 0.9 mg/dL (ref 0.4–1.5)
GFR: 93.06 mL/min (ref >60.00)
Glucose, Bld: 104 mg/dL — ABNORMAL HIGH (ref 70–99)
Sodium: 138 mEq/L (ref 135–145)

## 2012-07-05 NOTE — Assessment & Plan Note (Signed)
The patient is at significant risk for obstructive sleep apnea given his upper airway anatomy, large neck size, and body habitus.  He denies significant symptomatology during his waking hours, but admits that he is very busy and driven by work.  He is scheduled for upcoming total knee replacement, and I have discussed with him the significance of sleep apnea and postop orthopedic patients.  I have also reviewed the risk of pain medication leading to worsening sleep apnea and respiratory issues.  It is unclear whether the patient truly has sleep apnea or not, but given his risk factors and upcoming surgery, I do not think it would be unreasonable to do a home sleep study to risk stratify.  The patient is agreeable with this approach.

## 2012-07-05 NOTE — Progress Notes (Signed)
Subjective:    Patient ID: ASER NYLUND, male    DOB: 1950/07/03, 62 y.o.   MRN: [id number]  HPI CC: preop eval for L TKR  Mr. Durrell is a pleasant 62 yo with h/o morbid obesity, HTN, DJD, and sleep apnea untreated who presents today for preop evaluation for upcoming L TKR by Dr. Dion Saucier at [facility] scheduled on 07/25/2012.  Dx sleep apnea in past, but doesn't remember how he was diagnosed with this.  He denies ever having sleep study.  Never on CPAP.  Easily falls asleep in recliner.  Snores, but less than previously.  No endorsed apneic episodes, or PNDyspnea.  No falling asleep while driving.  Wakes up feeling rested.  Unable to exercise 2/2 L knee pain from end stage arthritis.  Prior to 3 months ago, was walking some, never chest discomfort or dyspnea with walking.  Since he's been more sedentary, notes increased dyspnea with exertion attributed to deconditioning. Lab Results  Component Value Date   CHOL 164 07/23/2011   HDL 45.40 07/23/2011   LDLCALC 105* 07/23/2011   TRIG 68.0 07/23/2011   CHOLHDL 4 07/23/2011   No h/o HTN, no h/o DM. BP Readings from Last 3 Encounters:  07/05/12 142/88  01/13/12 146/80  11/19/11 136/76  doesn't check bp at home.  No known drug allergies.  Clinical Data: Left knee pain. Evaluate osteoarthritis.  LEFT KNEE - COMPLETE 4+ VIEW  (11/19/2011) Comparison: None.  Findings: There is severe narrowing of the medial joint space. There is sclerosis of the cortical and subcortical bone of the adjacent medial tibial plateau. Posterior patellar osteophyte formation is seen. There is osteophyte formation involving the distal posterior aspect of the femur and the distal lateral aspect of the lateral femoral condyle. No fracture or bony destruction is evident. No chondrocalcinosis is evident. No opaque loose body is evident.  IMPRESSION:  Degenerative osteoarthritic changes are present. Severe narrowing of medial joint space with sclerosis of cortical and  subcortical bone in the adjacent medial tibial plateau area. Degenerative marginal osteophytes are seen. No erosive changes are evident.  Original Report Authenticated By: Crawford Givens, M.D.  Medications and allergies reviewed and updated in chart.  Past histories reviewed and updated if relevant as below. Patient Active Problem List   Diagnosis Date Noted  . Preoperative clearance 07/05/2012  . Sleep apnea   . Left elbow pain 11/19/2011  . Personal history of colonic polyps-serrated adenomas and tubular adenoma 08/20/2011  . Healthcare maintenance 07/23/2011  . Knee osteoarthritis 07/23/2011  . Nodule of finger, left 07/23/2011  . ROTATOR CUFF SYNDROME, RIGHT 08/02/2008  . OBESITY, UNSPECIFIED 02/15/2008  . HYPERGLYCEMIA 02/15/2008  . NEVI, MULTIPLE 05/23/2007   Past Medical History  Diagnosis Date  . Hyperglycemia   . Benign neoplasm of skin, site unspecified   . Morbid obesity   . Knee pain   . Jaundice     when very young, treated and resolved  . Hepatitis     has had jaundice in past, unsure of cause  . Sleep apnea     no CPAP  . DJD (degenerative joint disease) of knee    Past Surgical History  Procedure Laterality Date  . 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 Dorene Grebe  . Cholecystectomy  1998  . Tonsillectomy    . Colonoscopy  08/20/2011    Procedure: COLONOSCOPY;  Surgeon: Iva Boop, MD;  Location: [facility];  Service: Endoscopy - adenomatous polyps found   History  Substance Use Topics  . Smoking status: Never Smoker   . Smokeless tobacco: Never Used  . Alcohol Use: No     Comment: occasionally in past, not anymore for 20 years   Family History  Problem Relation Age of Onset  . Coronary artery disease Father 64    CABG X4  . Hypertension Father   . Ovarian cancer Mother   . Ulcers Mother     stomach  . Diabetes Neg Hx   . Prostate cancer Neg Hx   . Breast cancer Neg Hx   . Colon cancer Neg Hx   . Depression Neg Hx   . Alcohol abuse Neg Hx   .  Stroke Father    No Known Allergies Current Outpatient Prescriptions on File Prior to Visit  Medication Sig Dispense Refill  . Misc Natural Products (OSTEO BI-FLEX JOINT SHIELD PO) Take 2 capsules by mouth daily.      . Multiple Vitamin (MULTIVITAMIN) tablet Take 1 tablet by mouth daily.       No current facility-administered medications on file prior to visit.   Review of Systems  no recent fevers/chills, HA, dizziness, chest pain/tightness, presyncope. Some trouble with sleep maintenance.  Has not had sleep apnea testing done recently. Some bilateral pedal edema.     Objective:   Physical Exam  Nursing note and vitals reviewed. Constitutional: He appears well-developed and well-nourished. No distress.  Morbidly obese  HENT:  Head: Normocephalic and atraumatic.  Mouth/Throat: Oropharynx is clear and moist. No oropharyngeal exudate.  Eyes: Conjunctivae and EOM are normal. Pupils are equal, round, and reactive to light. No scleral icterus.  Neck: Normal range of motion. Neck supple. Carotid bruit is not present. No thyromegaly present.  Cardiovascular: Normal rate, regular rhythm, normal heart sounds and intact distal pulses.   No murmur heard. Pulmonary/Chest: Effort normal and breath sounds normal. No respiratory distress. He has no wheezes. He has no rales.  Musculoskeletal: He exhibits edema (1+ pitting edema bilaterally).  Lymphadenopathy:    He has no cervical adenopathy.  Skin: Skin is warm and dry. No rash noted.  Psychiatric: He has a normal mood and affect.       Assessment & Plan:

## 2012-07-05 NOTE — Assessment & Plan Note (Addendum)
morbid obesity along with ?OSA - see below. Intermediate risk procedure. Has tolerate GETA in past. Will obtain- CBC, BMP today. EKG today - NSR rate 80s, mild LAD, normal intervals, no acute ST/T changes or hypertrophy CXR today - some cardiomegaly noted on my read.  Will recommend echocardiogram to further assess for heart failure vs pulm HTN.  Discussed may need to start CPAP. Discussed risks of surgery. Will refer to pulm for sleep evaluation in likely h/o OSA and upcoming surgery.  ADDENDUM ==> pulm eval revealing mild-moderate OSA - planned weight loss after surgery to treat this, may need CPAP perioperatively in hospital.  See pulm note.  2D echo showing normal LV fxn with EF 55-60%, normal wall motion, mild diastolic dysfunction.  Should be adequately low risk after evaluation to undergo planned orthopedic procedure.

## 2012-07-05 NOTE — Patient Instructions (Signed)
Blood work today.  EKG today.  Chest xray today. Good to see you , call us with questions. I want you to see lung doctor for sleep study prior to surgery.  Pass by Marion's office to schedule this.

## 2012-07-05 NOTE — Assessment & Plan Note (Signed)
Has not had evaluation of this in the past.  Denies ever using CPAP in past. Certainly has body habitus for OSA as well as some sxs, however ESS today only 3. Given upcoming surgery, I do recommend pulm referral for formal testing for OSA and treatment if needed. Pt agrees.

## 2012-07-05 NOTE — Patient Instructions (Addendum)
Will schedule for home sleep testing, and will call once results available. Work on weight reduction.

## 2012-07-05 NOTE — Progress Notes (Signed)
Subjective:    Patient ID: Dennis Ayers, male    DOB: 08-11-1950, 62 y.o.   MRN: [id number]  HPI The patient is a 62 year old male who I've been asked to see for possible obstructive sleep apnea.  He is scheduled for upcoming total knee replacement, and the question has been raised whether he may have sleep apnea or not.  The patient has never been evaluated for sleep apnea, and has never had a sleep study.  He used to have very loud snoring, but his wife most recently has not been complaining of this.  She has never mentioned an abnormal breathing pattern during sleep.  The patient feels fairly rested in the mornings upon arising, and denies having significant sleep pressure during the day with inactivity.  However, he admits that he is extremely busy, and really does not pay attention to things other than his work.  He also travels quite a bit over different time zones, and this can sometimes lead to sleep issues.  He can get sleepy on rare occasions during the day on weekends, and denies any significant sleepiness in the evenings.  He denies any sleepiness with driving.  The patient's weight is down about 15 pounds over the last 2 years, and his Epworth score today is 3.   Sleep Questionnaire What time do you typically go to bed?( Between what hours) 10-11 pm  10-11 pm  at 1523 on 07/05/12 by Alecia Lemming, CMA How long does it take you to fall asleep? 10-15 mins 10-15 mins at 1523 on 07/05/12 by Alecia Lemming, CMA How many times during the night do you wake up? 2 2 at 1523 on 07/05/12 by Alecia Lemming, CMA What time do you get out of bed to start your day? 0530 0530 at 1523 on 07/05/12 by Alecia Lemming, CMA Do you drive or operate heavy machinery in your occupation? No No at 1523 on 07/05/12 by Alecia Lemming, CMA How much has your weight changed (up or down) over the past two years? (In pounds) 20 lb (9.072 kg) 20 lb (9.072 kg) at 1523 on 07/05/12 by Alecia Lemming, CMA Have you ever had a sleep study before? No No at 1523 on 07/05/12 by Alecia Lemming, CMA Do you currently use CPAP? No No at 1523 on 07/05/12 by Alecia Lemming, CMA Do you wear oxygen at any time? No No at 1523 on 07/05/12 by Alecia Lemming, CMA   Review of Systems  Constitutional: Negative for fever and unexpected weight change.  HENT: Positive for sneezing. Negative for ear pain, nosebleeds, congestion, sore throat, rhinorrhea, trouble swallowing, dental problem, postnasal drip and sinus pressure.   Eyes: Negative for redness and itching.  Respiratory: Negative for cough, chest tightness, shortness of breath and wheezing.   Cardiovascular: Positive for leg swelling. Negative for palpitations.  Gastrointestinal: Negative for nausea and vomiting.  Genitourinary: Negative for dysuria.  Musculoskeletal: Negative for joint swelling.  Skin: Negative for rash.  Neurological: Negative for headaches.  Hematological: Does not bruise/bleed easily.  Psychiatric/Behavioral: Negative for dysphoric mood. The patient is nervous/anxious.        Objective:   Physical Exam Constitutional:  Obese male, no acute distress  HENT:  Nares patent without discharge, but narrowed bilat.  Oropharynx without exudate, palate and uvula are thick and elongated, large tongue, side wall narrowing.   Eyes:  Perrla, eomi, no scleral icterus  Neck:  No JVD, no TMG  Cardiovascular:  Normal rate, regular rhythm, no rubs or gallops.  No murmurs        Intact distal pulses  Pulmonary :  Normal breath sounds, no stridor or respiratory distress   No rales, rhonchi, or wheezing  Abdominal:  Soft, nondistended, bowel sounds present.  No tenderness noted.   Musculoskeletal:  1+ lower extremity edema noted.  Lymph Nodes:  No cervical lymphadenopathy noted  Skin:  No cyanosis noted  Neurologic:  Alert, appropriate, moves all 4 extremities without obvious deficit.         Assessment  & Plan:

## 2012-07-07 ENCOUNTER — Telehealth: Payer: Self-pay | Admitting: Pulmonary Disease

## 2012-07-07 ENCOUNTER — Other Ambulatory Visit: Payer: Self-pay | Admitting: Orthopedic Surgery

## 2012-07-07 NOTE — Telephone Encounter (Signed)
Dennis Ayers, this pt needs ov to review his recent sleep study. Thanks.  

## 2012-07-07 NOTE — Telephone Encounter (Signed)
Patient scheduled for appt to review sleep study 07/14/12 at 430

## 2012-07-14 ENCOUNTER — Ambulatory Visit (INDEPENDENT_AMBULATORY_CARE_PROVIDER_SITE_OTHER): Payer: 59 | Admitting: Pulmonary Disease

## 2012-07-14 ENCOUNTER — Encounter: Payer: Self-pay | Admitting: Pulmonary Disease

## 2012-07-14 VITALS — BP 122/78 | HR 94 | Temp 97.7°F | Ht 75.0 in | Wt 357.0 lb

## 2012-07-14 DIAGNOSIS — G4733 Obstructive sleep apnea (adult) (pediatric): Secondary | ICD-10-CM

## 2012-07-14 NOTE — Patient Instructions (Addendum)
Work on weight loss, but call if you would like to try cpap. followup with me as needed.

## 2012-07-14 NOTE — Progress Notes (Signed)
  Subjective:    Patient ID: Dennis Ayers, male    DOB: 1950/08/31, 62 y.o.   MRN: [id number]  HPI Patient comes in today for followup of his recent sleep testing.  He was found to have mild to moderate obstructive sleep apnea, with an AHI of 18 events per hour.  I have reviewed this study with him in detail, and answered all of his questions.   Review of Systems  Constitutional: Negative for fever and unexpected weight change.  HENT: Negative for ear pain, nosebleeds, congestion, sore throat, rhinorrhea, sneezing, trouble swallowing, dental problem, postnasal drip and sinus pressure.   Eyes: Negative for redness and itching.  Respiratory: Negative for cough, chest tightness, shortness of breath and wheezing.   Cardiovascular: Negative for palpitations and leg swelling.  Gastrointestinal: Negative for nausea and vomiting.  Genitourinary: Negative for dysuria.  Musculoskeletal: Negative for joint swelling.  Skin: Negative for rash.  Neurological: Negative for headaches.  Hematological: Does not bruise/bleed easily.  Psychiatric/Behavioral: Negative for dysphoric mood. The patient is not nervous/anxious.        Objective:   Physical Exam Obese male in no acute distress Nose without purulence or discharge noted Neck without lymphadenopathy or thyromegaly Lower extremities with mild edema, no cyanosis  alert, does not appear to be sleepy, moves all 4 extremities.       Assessment & Plan:

## 2012-07-14 NOTE — Assessment & Plan Note (Signed)
The patient has mild to moderate obstructive sleep apnea by his home sleep testing, however he is basically asymptomatic based on his history.  He also does not have significant cardiovascular disease that can be greatly influenced by this degree of sleep apnea.  I have outlined a treatment regimen of a trial of weight loss alone, versus trying CPAP.  The patient would like to work on the former after his knee surgery, and see how he does.  He is willing to try CPAP if he is found to have significant cardiovascular disease that can be impacted.  He understands that he may need CPAP in the postoperative period after his surgery, and this can be provided for him by respiratory therapy at the hospital.

## 2012-07-18 ENCOUNTER — Ambulatory Visit (INDEPENDENT_AMBULATORY_CARE_PROVIDER_SITE_OTHER): Payer: 59 | Admitting: Cardiology

## 2012-07-18 ENCOUNTER — Encounter (HOSPITAL_COMMUNITY): Payer: Self-pay | Admitting: Pharmacy Technician

## 2012-07-18 DIAGNOSIS — I517 Cardiomegaly: Secondary | ICD-10-CM

## 2012-07-18 DIAGNOSIS — R0602 Shortness of breath: Secondary | ICD-10-CM

## 2012-07-18 NOTE — Pre-Procedure Instructions (Signed)
VLADIMIR LENHOFF  07/18/2012  Your procedure is scheduled on:  07-25-2012   Tuesday   Report to [facility] at 5:30 AM.  Call this number if you have problems the morning of surgery: [phone]   Remember:   Do not eat food or drink liquids after midnight.    Take these medicines the morning of surgery with A SIP OF WATER: none   Do not wear jewelry,  Do not wear lotions, powders, or perfumes.   Do not shave 48 hours prior to surgery. Men may shave face and neck.  Do not bring valuables to the hospital.  Contacts, dentures or bridgework may not be worn into surgery.  Leave suitcase in the car. After surgery it may be brought to your room.   For patients admitted to the hospital, checkout time is 11:00 AM the day of discharge.      Special Instructions: Shower using CHG 2 nights before surgery and the night before surgery.  If you shower the day of surgery use CHG.  Use special wash - you have one bottle of CHG for all showers.  You should use approximately 1/3 of the bottle for each shower.   Please read over the following fact sheets that you were given: Pain Booklet, Coughing and Deep Breathing, Blood Transfusion Information and Surgical Site Infection Prevention

## 2012-07-19 ENCOUNTER — Telehealth: Payer: Self-pay

## 2012-07-19 ENCOUNTER — Encounter (HOSPITAL_COMMUNITY): Payer: Self-pay

## 2012-07-19 ENCOUNTER — Encounter (HOSPITAL_COMMUNITY)
Admission: RE | Admit: 2012-07-19 | Discharge: 2012-07-19 | Disposition: A | Discharge status: Home / Self Care | Payer: 59 | Source: Ambulatory Visit | Attending: Orthopedic Surgery | Admitting: Orthopedic Surgery

## 2012-07-19 ENCOUNTER — Encounter: Payer: Self-pay | Admitting: Family Medicine

## 2012-07-19 DIAGNOSIS — Z01812 Encounter for preprocedural laboratory examination: Secondary | ICD-10-CM | POA: Insufficient documentation

## 2012-07-19 DIAGNOSIS — Z01818 Encounter for other preprocedural examination: Secondary | ICD-10-CM | POA: Insufficient documentation

## 2012-07-19 LAB — COMPREHENSIVE METABOLIC PANEL
ALT: 46 U/L (ref 0–53)
AST: 32 U/L (ref 0–37)
Albumin: 3.8 g/dL (ref 3.5–5.2)
Alkaline Phosphatase: 63 U/L (ref 39–117)
BUN: 20 mg/dL (ref 6–23)
Chloride: 100 mEq/L (ref 96–112)
Potassium: 4.1 mEq/L (ref 3.5–5.1)
Total Bilirubin: 0.5 mg/dL (ref 0.3–1.2)

## 2012-07-19 LAB — PROTIME-INR: Prothrombin Time: 13.5 seconds (ref 11.6–15.2)

## 2012-07-19 LAB — CBC
Hemoglobin: 16.7 g/dL (ref 13.0–17.0)
MCH: 32.1 pg (ref 26.0–34.0)
MCHC: 35.1 g/dL (ref 30.0–36.0)
Platelets: 211 10*3/uL (ref 150–400)

## 2012-07-19 LAB — TYPE AND SCREEN: Antibody Screen: NEGATIVE

## 2012-07-19 LAB — SURGICAL PCR SCREEN: MRSA, PCR: NEGATIVE

## 2012-07-19 LAB — ABO/RH: ABO/RH(D): O POS

## 2012-07-19 NOTE — Progress Notes (Addendum)
Anesthesia Chart Review:  Patient is a 62 year old male scheduled for left TKA on 07/25/12 by Dr. Dion Saucier.  History includes morbid obesity, non-smoker, mild to moderate OSA (Dr. Shelle Iron), jaundice as a child (no specified etiology), hyperglycemia without formal diagnosis of DM, arthritis, tonsillectomy, cholecystectomy. PCP is Dr. Sharen Hones, who saw patient for a pre-operative evaluation on 07/05/12.  He ordered a pulmonary evaluation due to OSA and a 2D echo preoperatively. Following the results and recommendations, Dr. Sharen Hones, wrote, "pulm eval revealing mild-moderate OSA - planned weight loss after surgery to treat this, may need CPAP perioperatively in hospital. See pulm note. 2D echo showing normal LV fxn with EF 55-60%, normal wall motion, mild diastolic dysfunction. Should be adequately low risk after evaluation to undergo planned orthopedic procedure."  Patient is not yet on CPAP, but understands that it may be required in the post-operative period.  EKG on 07/05/12 showed NSR, LAD.  Echo on 07/18/12 showed: - Procedure narrative: Transthoracic echocardiography. Image quality was poor. The study was technically difficult, as a result of poor acoustic windows, restricted patient mobility, and body habitus. - Left ventricle: The cavity size was normal. There was mild concentric hypertrophy. Systolic function was normal. The estimated ejection fraction was in the range of 55% to 60%. Wall motion was normal; there were no regional wall motion abnormalities. Doppler parameters are consistent with abnormal left ventricular relaxation (grade 1 diastolic dysfunction).  CXR on 07/05/12 showed mild cardiac enlargement.  No consolidation, pleural effusion, or edema noted.  Preoperative labs noted.  Non-fasting glucose was elevated at 230.  His previous glucose at his PCP office two weeks ago (07/05/12) was only 104.  It appears that no UA was ordered.  I notified Sherrie at Dr. Shelba Flake office of elevated glucose  and that no UA was ordered.  I also routed CMET results to Dr. Sharen Hones for additional recommendations, if any.  He will need a fasting CBG on arrival to ensure his glucose reading is acceptable prior to proceeding.  Velna Ochs [facility]/Anesthesiology Phone [phone] 07/19/2012 4:00PM  Addendum: 07/24/12 1210 This message from 07/21/12 by Dr. Esmeralda Arthur noted that stated, "Received notice that recent blood work showed random glu 230. I have asked to add A1c to blood in lab, pending results. I will also ask for perioperative cbg's as needed. Please fax this phone note attn Shonna Chock (fax [phone])."  It appears A1c could not be added to the labs already drawn on 07/19/12.  As above, plan for CBG on arrival.

## 2012-07-19 NOTE — Telephone Encounter (Signed)
Sherry with Dr Shelba Flake office left v/m requesting status of surgical clearance; pt to have lt total knee replacement on 07/25/12.Please advise.

## 2012-07-19 NOTE — Telephone Encounter (Signed)
Form is in Kim's box to send off.

## 2012-07-19 NOTE — Progress Notes (Signed)
Left message with Sherrie at Dr.Landau's office theat pt. Dennis Ayers circumference is 20.5 inches and we do not have TED's that will fit.

## 2012-07-20 ENCOUNTER — Encounter: Payer: Self-pay | Admitting: *Deleted

## 2012-07-20 NOTE — Telephone Encounter (Signed)
Clearance form faxed to Dr. Shelba Flake office.

## 2012-07-21 ENCOUNTER — Telehealth: Payer: Self-pay | Admitting: Family Medicine

## 2012-07-21 NOTE — Telephone Encounter (Signed)
Received notice that recent blood work showed random glu 230. I have asked to add A1c to blood in lab, pending results. I will also ask for perioperative cbg's as needed. Please fax this phone note attn Shonna Chock (fax [phone])

## 2012-07-21 NOTE — Telephone Encounter (Signed)
Note faxed as directed

## 2012-07-24 MED ORDER — DEXTROSE 5 % IV SOLN
3.0000 g | INTRAVENOUS | Status: AC
  Administered 2012-07-25: 3 g via INTRAVENOUS
  Filled 2012-07-24: qty 3000

## 2012-07-25 ENCOUNTER — Encounter (HOSPITAL_COMMUNITY): Payer: Self-pay | Admitting: Vascular Surgery

## 2012-07-25 ENCOUNTER — Ambulatory Visit (HOSPITAL_COMMUNITY): Payer: 59 | Admitting: Anesthesiology

## 2012-07-25 ENCOUNTER — Encounter (HOSPITAL_COMMUNITY)
Admission: RE | Disposition: A | Discharge status: Home / Self Care | Payer: Self-pay | Source: Ambulatory Visit | Attending: Orthopedic Surgery

## 2012-07-25 ENCOUNTER — Inpatient Hospital Stay (HOSPITAL_COMMUNITY)
Admission: RE | Admit: 2012-07-25 | Discharge: 2012-07-28 | DRG: 470 | Disposition: A | Discharge status: Home / Self Care | Payer: 59 | Source: Ambulatory Visit | Attending: Orthopedic Surgery | Admitting: Orthopedic Surgery

## 2012-07-25 ENCOUNTER — Encounter (HOSPITAL_COMMUNITY): Payer: Self-pay | Admitting: *Deleted

## 2012-07-25 ENCOUNTER — Inpatient Hospital Stay (HOSPITAL_COMMUNITY): Payer: 59

## 2012-07-25 DIAGNOSIS — G4733 Obstructive sleep apnea (adult) (pediatric): Secondary | ICD-10-CM | POA: Diagnosis present

## 2012-07-25 DIAGNOSIS — M1712 Unilateral primary osteoarthritis, left knee: Secondary | ICD-10-CM

## 2012-07-25 DIAGNOSIS — M1710 Unilateral primary osteoarthritis, unspecified knee: Principal | ICD-10-CM | POA: Diagnosis present

## 2012-07-25 DIAGNOSIS — R7309 Other abnormal glucose: Secondary | ICD-10-CM | POA: Diagnosis present

## 2012-07-25 DIAGNOSIS — Z6841 Body Mass Index (BMI) 40.0 and over, adult: Secondary | ICD-10-CM

## 2012-07-25 DIAGNOSIS — M179 Osteoarthritis of knee, unspecified: Secondary | ICD-10-CM | POA: Diagnosis present

## 2012-07-25 HISTORY — PX: TOTAL KNEE ARTHROPLASTY: SHX125

## 2012-07-25 HISTORY — DX: Morbid (severe) obesity due to excess calories: E66.01

## 2012-07-25 LAB — GLUCOSE, CAPILLARY
Glucose-Capillary: 160 mg/dL — ABNORMAL HIGH (ref 70–99)
Glucose-Capillary: 198 mg/dL — ABNORMAL HIGH (ref 70–99)
Glucose-Capillary: 173 mg/dL — ABNORMAL HIGH (ref 70–99)

## 2012-07-25 LAB — CREATININE, SERUM
Creatinine, Ser: 0.84 mg/dL (ref 0.50–1.35)
GFR calc Af Amer: >90 mL/min (ref >90)
GFR calc non Af Amer: >90 mL/min (ref >90)

## 2012-07-25 LAB — CBC
Hemoglobin: 16.1 g/dL (ref 13.0–17.0)
Platelets: 236 10*3/uL (ref 150–400)
RBC: 4.92 MIL/uL (ref 4.22–5.81)
WBC: 10.7 10*3/uL — ABNORMAL HIGH (ref 4.0–10.5)

## 2012-07-25 SURGERY — ARTHROPLASTY, KNEE, TOTAL
Anesthesia: General | Site: Knee | Laterality: Left | Wound class: Clean

## 2012-07-25 MED ORDER — ACETAMINOPHEN 650 MG RE SUPP: 650.0000 mg | Freq: Four times a day (QID) | RECTAL | Status: DC | PRN

## 2012-07-25 MED ORDER — FENTANYL CITRATE 0.05 MG/ML IJ SOLN
INTRAMUSCULAR | Status: DC | PRN
  Administered 2012-07-25: 50 ug via INTRAVENOUS
  Administered 2012-07-25: 100 ug via INTRAVENOUS
  Administered 2012-07-25: 150 ug via INTRAVENOUS
  Administered 2012-07-25: 100 ug via INTRAVENOUS

## 2012-07-25 MED ORDER — NEOSTIGMINE METHYLSULFATE 1 MG/ML IJ SOLN
INTRAMUSCULAR | Status: DC | PRN
  Administered 2012-07-25: 4 mg via INTRAVENOUS

## 2012-07-25 MED ORDER — ONDANSETRON HCL 4 MG PO TABS: 4.0000 mg | ORAL_TABLET | Freq: Four times a day (QID) | ORAL | Status: DC | PRN

## 2012-07-25 MED ORDER — DEXTROSE 5 % IV SOLN
3.0000 g | Freq: Four times a day (QID) | INTRAVENOUS | Status: AC
  Administered 2012-07-25 (×2): 3 g via INTRAVENOUS
  Filled 2012-07-25 (×2): qty 3000

## 2012-07-25 MED ORDER — PHENYLEPHRINE HCL 10 MG/ML IJ SOLN
INTRAMUSCULAR | Status: DC | PRN
  Administered 2012-07-25: 80 ug via INTRAVENOUS

## 2012-07-25 MED ORDER — DOCUSATE SODIUM 100 MG PO CAPS
100.0000 mg | ORAL_CAPSULE | Freq: Two times a day (BID) | ORAL | Status: DC
  Administered 2012-07-25 – 2012-07-28 (×6): 100 mg via ORAL
  Filled 2012-07-25 (×6): qty 1

## 2012-07-25 MED ORDER — PHENOL 1.4 % MT LIQD: 1.0000 | OROMUCOSAL | Status: DC | PRN

## 2012-07-25 MED ORDER — SODIUM CHLORIDE 0.9 % IR SOLN
Status: DC | PRN
  Administered 2012-07-25: 3000 mL

## 2012-07-25 MED ORDER — OXYCODONE HCL 5 MG PO TABS
5.0000 mg | ORAL_TABLET | ORAL | Status: DC | PRN
  Administered 2012-07-25: 5 mg via ORAL
  Administered 2012-07-26 – 2012-07-28 (×10): 10 mg via ORAL
  Filled 2012-07-25 (×6): qty 2
  Filled 2012-07-25: qty 1
  Filled 2012-07-25 (×4): qty 2

## 2012-07-25 MED ORDER — MENTHOL 3 MG MT LOZG
1.0000 | LOZENGE | OROMUCOSAL | Status: DC | PRN
  Administered 2012-07-25: 3 mg via ORAL
  Filled 2012-07-25: qty 9

## 2012-07-25 MED ORDER — ALUM & MAG HYDROXIDE-SIMETH 200-200-20 MG/5ML PO SUSP: 30.0000 mL | ORAL | Status: DC | PRN

## 2012-07-25 MED ORDER — METHOCARBAMOL 500 MG PO TABS: 500.0000 mg | ORAL_TABLET | Freq: Four times a day (QID) | ORAL | Status: DC

## 2012-07-25 MED ORDER — ONDANSETRON HCL 4 MG/2ML IJ SOLN
INTRAMUSCULAR | Status: DC | PRN
  Administered 2012-07-25: 4 mg via INTRAVENOUS

## 2012-07-25 MED ORDER — KETOROLAC TROMETHAMINE 30 MG/ML IJ SOLN
INTRAMUSCULAR | Status: AC
  Filled 2012-07-25: qty 1

## 2012-07-25 MED ORDER — POLYETHYLENE GLYCOL 3350 17 G PO PACK: 17.0000 g | PACK | Freq: Every day | ORAL | Status: DC | PRN

## 2012-07-25 MED ORDER — METOCLOPRAMIDE HCL 5 MG/ML IJ SOLN: 5.0000 mg | Freq: Three times a day (TID) | INTRAMUSCULAR | Status: DC | PRN

## 2012-07-25 MED ORDER — ZOLPIDEM TARTRATE 5 MG PO TABS: 5.0000 mg | ORAL_TABLET | Freq: Every evening | ORAL | Status: DC | PRN

## 2012-07-25 MED ORDER — HYDROMORPHONE HCL PF 1 MG/ML IJ SOLN
INTRAMUSCULAR | Status: AC
  Administered 2012-07-25: 0.5 mg via INTRAVENOUS
  Filled 2012-07-25: qty 2

## 2012-07-25 MED ORDER — ROCURONIUM BROMIDE 100 MG/10ML IV SOLN
INTRAVENOUS | Status: DC | PRN
  Administered 2012-07-25: 50 mg via INTRAVENOUS

## 2012-07-25 MED ORDER — LACTATED RINGERS IV SOLN
INTRAVENOUS | Status: DC | PRN
  Administered 2012-07-25 (×2): via INTRAVENOUS

## 2012-07-25 MED ORDER — MAGNESIUM CITRATE PO SOLN
1.0000 | Freq: Once | ORAL | Status: AC | PRN
  Filled 2012-07-25: qty 296

## 2012-07-25 MED ORDER — HYDROMORPHONE HCL PF 1 MG/ML IJ SOLN
1.0000 mg | INTRAMUSCULAR | Status: DC | PRN
  Administered 2012-07-25: 1 mg via INTRAVENOUS
  Filled 2012-07-25: qty 1

## 2012-07-25 MED ORDER — DIPHENHYDRAMINE HCL 12.5 MG/5ML PO ELIX: 12.5000 mg | ORAL_SOLUTION | ORAL | Status: DC | PRN

## 2012-07-25 MED ORDER — SENNA 8.6 MG PO TABS
1.0000 | ORAL_TABLET | Freq: Two times a day (BID) | ORAL | Status: DC
  Administered 2012-07-25 – 2012-07-28 (×6): 8.6 mg via ORAL
  Filled 2012-07-25 (×7): qty 1

## 2012-07-25 MED ORDER — HYDROMORPHONE HCL PF 1 MG/ML IJ SOLN
INTRAMUSCULAR | Status: AC
  Filled 2012-07-25: qty 1

## 2012-07-25 MED ORDER — DEXAMETHASONE SODIUM PHOSPHATE 4 MG/ML IJ SOLN
INTRAMUSCULAR | Status: DC | PRN
  Administered 2012-07-25: 4 mg via INTRAVENOUS

## 2012-07-25 MED ORDER — METOCLOPRAMIDE HCL 10 MG PO TABS: 5.0000 mg | ORAL_TABLET | Freq: Three times a day (TID) | ORAL | Status: DC | PRN

## 2012-07-25 MED ORDER — BISACODYL 10 MG RE SUPP: 10.0000 mg | Freq: Every day | RECTAL | Status: DC | PRN

## 2012-07-25 MED ORDER — METHOCARBAMOL 500 MG PO TABS
500.0000 mg | ORAL_TABLET | Freq: Four times a day (QID) | ORAL | Status: DC | PRN
  Administered 2012-07-26 – 2012-07-28 (×3): 500 mg via ORAL
  Filled 2012-07-25 (×4): qty 1

## 2012-07-25 MED ORDER — PROMETHAZINE HCL 25 MG PO TABS: 25.0000 mg | ORAL_TABLET | Freq: Four times a day (QID) | ORAL | Status: DC | PRN

## 2012-07-25 MED ORDER — HYDROMORPHONE HCL PF 1 MG/ML IJ SOLN
0.2500 mg | INTRAMUSCULAR | Status: DC | PRN
Start: 2012-07-25 — End: 2012-07-25
  Administered 2012-07-25 (×4): 0.5 mg via INTRAVENOUS

## 2012-07-25 MED ORDER — METHOCARBAMOL 100 MG/ML IJ SOLN
500.0000 mg | Freq: Four times a day (QID) | INTRAVENOUS | Status: DC | PRN
  Filled 2012-07-25: qty 5

## 2012-07-25 MED ORDER — MIDAZOLAM HCL 5 MG/5ML IJ SOLN
INTRAMUSCULAR | Status: DC | PRN
  Administered 2012-07-25: 1 mg via INTRAVENOUS

## 2012-07-25 MED ORDER — PROPOFOL 10 MG/ML IV BOLUS
INTRAVENOUS | Status: DC | PRN
  Administered 2012-07-25: 300 mg via INTRAVENOUS
  Administered 2012-07-25: 100 mg via INTRAVENOUS

## 2012-07-25 MED ORDER — KETOROLAC TROMETHAMINE 15 MG/ML IJ SOLN
7.5000 mg | Freq: Four times a day (QID) | INTRAMUSCULAR | Status: AC
  Administered 2012-07-25 – 2012-07-26 (×4): 7.5 mg via INTRAVENOUS
  Filled 2012-07-25 (×4): qty 1

## 2012-07-25 MED ORDER — ENOXAPARIN SODIUM 30 MG/0.3ML ~~LOC~~ SOLN
30.0000 mg | Freq: Two times a day (BID) | SUBCUTANEOUS | Status: DC
  Administered 2012-07-26 – 2012-07-28 (×5): 30 mg via SUBCUTANEOUS
  Filled 2012-07-25 (×9): qty 0.3

## 2012-07-25 MED ORDER — ACETAMINOPHEN 325 MG PO TABS
650.0000 mg | ORAL_TABLET | Freq: Four times a day (QID) | ORAL | Status: DC | PRN
  Administered 2012-07-27: 650 mg via ORAL
  Filled 2012-07-25: qty 2

## 2012-07-25 MED ORDER — BUPIVACAINE HCL (PF) 0.25 % IJ SOLN
INTRAMUSCULAR | Status: AC
  Filled 2012-07-25: qty 30

## 2012-07-25 MED ORDER — INSULIN ASPART 100 UNIT/ML ~~LOC~~ SOLN
0.0000 [IU] | Freq: Three times a day (TID) | SUBCUTANEOUS | Status: DC
  Administered 2012-07-25: 3 [IU] via SUBCUTANEOUS
  Administered 2012-07-26 – 2012-07-28 (×7): 2 [IU] via SUBCUTANEOUS

## 2012-07-25 MED ORDER — ONDANSETRON HCL 4 MG/2ML IJ SOLN: 4.0000 mg | Freq: Once | INTRAMUSCULAR | Status: DC | PRN

## 2012-07-25 MED ORDER — OXYCODONE-ACETAMINOPHEN 10-325 MG PO TABS: 1.0000 | ORAL_TABLET | Freq: Four times a day (QID) | ORAL | Status: DC | PRN

## 2012-07-25 MED ORDER — ONDANSETRON HCL 4 MG/2ML IJ SOLN: 4.0000 mg | Freq: Four times a day (QID) | INTRAMUSCULAR | Status: DC | PRN

## 2012-07-25 MED ORDER — SUCCINYLCHOLINE CHLORIDE 20 MG/ML IJ SOLN
INTRAMUSCULAR | Status: DC | PRN
  Administered 2012-07-25: 130 mg via INTRAVENOUS

## 2012-07-25 MED ORDER — VECURONIUM BROMIDE 10 MG IV SOLR
INTRAVENOUS | Status: DC | PRN
  Administered 2012-07-25: 3 mg via INTRAVENOUS
  Administered 2012-07-25: 2 mg via INTRAVENOUS

## 2012-07-25 MED ORDER — GLYCOPYRROLATE 0.2 MG/ML IJ SOLN
INTRAMUSCULAR | Status: DC | PRN
  Administered 2012-07-25: .6 mg via INTRAVENOUS

## 2012-07-25 MED ORDER — POTASSIUM CHLORIDE IN NACL 20-0.45 MEQ/L-% IV SOLN
INTRAVENOUS | Status: DC
  Administered 2012-07-25 – 2012-07-27 (×3): via INTRAVENOUS
  Filled 2012-07-25 (×7): qty 1000

## 2012-07-25 MED ORDER — LIDOCAINE HCL (CARDIAC) 20 MG/ML IV SOLN
INTRAVENOUS | Status: DC | PRN
  Administered 2012-07-25: 80 mg via INTRAVENOUS

## 2012-07-25 SURGICAL SUPPLY — 69 items
APL SKNCLS STERI-STRIP NONHPOA (GAUZE/BANDAGES/DRESSINGS) ×1
BANDAGE ELASTIC 6 VELCRO ST LF (GAUZE/BANDAGES/DRESSINGS) ×3 IMPLANT
BANDAGE ESMARK 6X9 LF (GAUZE/BANDAGES/DRESSINGS) ×1 IMPLANT
BENZOIN TINCTURE PRP APPL 2/3 (GAUZE/BANDAGES/DRESSINGS) ×2 IMPLANT
BLADE SAG 18X100X1.27 (BLADE) ×2 IMPLANT
BLADE SAW RECIP 87.9 MT (BLADE) ×2 IMPLANT
BLADE SAW SGTL 13X75X1.27 (BLADE) ×2 IMPLANT
BNDG CMPR 9X6 STRL LF SNTH (GAUZE/BANDAGES/DRESSINGS) ×1
BNDG ESMARK 6X9 LF (GAUZE/BANDAGES/DRESSINGS) ×2
BOOTCOVER CLEANROOM LRG (PROTECTIVE WEAR) ×4 IMPLANT
BOWL SMART MIX CTS (DISPOSABLE) ×2 IMPLANT
CAPT RP KNEE ×1 IMPLANT
CEMENT HV SMART SET (Cement) ×4 IMPLANT
CLOTH BEACON ORANGE TIMEOUT ST (SAFETY) ×2 IMPLANT
COVER SURGICAL LIGHT HANDLE (MISCELLANEOUS) ×2 IMPLANT
CUFF TOURNIQUET SINGLE 34IN LL (TOURNIQUET CUFF) ×1 IMPLANT
DRAPE EXTREMITY T 121X128X90 (DRAPE) ×2 IMPLANT
DRAPE PROXIMA HALF (DRAPES) ×2 IMPLANT
DRAPE U-SHAPE 47X51 STRL (DRAPES) ×2 IMPLANT
DRSG PAD ABDOMINAL 8X10 ST (GAUZE/BANDAGES/DRESSINGS) ×2 IMPLANT
DURAPREP 26ML APPLICATOR (WOUND CARE) ×2 IMPLANT
ELECT CAUTERY BLADE 6.4 (BLADE) ×2 IMPLANT
ELECT REM PT RETURN 9FT ADLT (ELECTROSURGICAL) ×2
ELECTRODE REM PT RTRN 9FT ADLT (ELECTROSURGICAL) ×1 IMPLANT
FACESHIELD LNG OPTICON STERILE (SAFETY) ×2 IMPLANT
GLOVE BIOGEL PI IND STRL 7.0 (GLOVE) IMPLANT
GLOVE BIOGEL PI IND STRL 7.5 (GLOVE) IMPLANT
GLOVE BIOGEL PI IND STRL 8.5 (GLOVE) IMPLANT
GLOVE BIOGEL PI INDICATOR 7.0 (GLOVE) ×1
GLOVE BIOGEL PI INDICATOR 7.5 (GLOVE) ×2
GLOVE BIOGEL PI INDICATOR 8.5 (GLOVE) ×2
GLOVE BIOGEL PI ORTHO PRO SZ8 (GLOVE) ×2
GLOVE ORTHO TXT STRL SZ7.5 (GLOVE) ×2 IMPLANT
GLOVE PI ORTHO PRO STRL SZ8 (GLOVE) ×2 IMPLANT
GLOVE SKINSENSE NS SZ7.5 (GLOVE) ×1
GLOVE SKINSENSE NS SZ8.0 LF (GLOVE) ×1
GLOVE SKINSENSE STRL SZ7.5 (GLOVE) IMPLANT
GLOVE SKINSENSE STRL SZ8.0 LF (GLOVE) IMPLANT
GLOVE SURG ORTHO 8.0 STRL STRW (GLOVE) ×2 IMPLANT
GOWN BRE IMP PREV XXLGXLNG (GOWN DISPOSABLE) ×2 IMPLANT
GOWN STRL REIN XL XLG (GOWN DISPOSABLE) ×4 IMPLANT
HANDPIECE INTERPULSE COAX TIP (DISPOSABLE) ×2
HOOD PEEL AWAY FACE SHEILD DIS (HOOD) ×5 IMPLANT
IMMOBILIZER KNEE 22 UNIV (SOFTGOODS) ×2 IMPLANT
KIT BASIN OR (CUSTOM PROCEDURE TRAY) ×2 IMPLANT
KIT ROOM TURNOVER OR (KITS) ×2 IMPLANT
MANIFOLD NEPTUNE II (INSTRUMENTS) ×2 IMPLANT
NS IRRIG 1000ML POUR BTL (IV SOLUTION) ×2 IMPLANT
PACK TOTAL JOINT (CUSTOM PROCEDURE TRAY) ×2 IMPLANT
PAD ARMBOARD 7.5X6 YLW CONV (MISCELLANEOUS) ×4 IMPLANT
PAD CAST 4YDX4 CTTN HI CHSV (CAST SUPPLIES) ×1 IMPLANT
PADDING CAST COTTON 4X4 STRL (CAST SUPPLIES) ×2
PADDING CAST COTTON 6X4 STRL (CAST SUPPLIES) ×2 IMPLANT
SET HNDPC FAN SPRY TIP SCT (DISPOSABLE) ×1 IMPLANT
SPONGE GAUZE 4X4 12PLY (GAUZE/BANDAGES/DRESSINGS) ×2 IMPLANT
STAPLER VISISTAT 35W (STAPLE) ×2 IMPLANT
STRIP CLOSURE SKIN 1/2X4 (GAUZE/BANDAGES/DRESSINGS) ×2 IMPLANT
SUCTION FRAZIER TIP 10 FR DISP (SUCTIONS) ×2 IMPLANT
SUT MNCRL AB 4-0 PS2 18 (SUTURE) IMPLANT
SUT VIC AB 0 CT1 27 (SUTURE) ×2
SUT VIC AB 0 CT1 27XBRD ANBCTR (SUTURE) ×1 IMPLANT
SUT VIC AB 2-0 CT1 27 (SUTURE) ×2
SUT VIC AB 2-0 CT1 TAPERPNT 27 (SUTURE) ×1 IMPLANT
SUT VIC AB 3-0 SH 8-18 (SUTURE) ×4 IMPLANT
SYR 30ML LL (SYRINGE) ×2 IMPLANT
TOWEL OR 17X24 6PK STRL BLUE (TOWEL DISPOSABLE) ×2 IMPLANT
TOWEL OR 17X26 10 PK STRL BLUE (TOWEL DISPOSABLE) ×2 IMPLANT
TRAY FOLEY CATH 14FR (SET/KITS/TRAYS/PACK) IMPLANT
WATER STERILE IRR 1000ML POUR (IV SOLUTION) ×4 IMPLANT

## 2012-07-25 NOTE — Progress Notes (Signed)
Orthopedic Tech Progress Note Patient Details:  Dennis Ayers 1950-09-10 [id number]  CPM Left Knee CPM Left Knee: On Left Knee Flexion (Degrees): 60 Left Knee Extension (Degrees): 0 Additional Comments: trapeze bar patient helper   Nikki Dom 07/25/2012, 2:09 PM

## 2012-07-25 NOTE — Preoperative (Signed)
Beta Blockers   Reason not to administer Beta Blockers:Not Applicable 

## 2012-07-25 NOTE — Op Note (Signed)
DATE OF SURGERY:  07/25/2012 TIME: 9:45 AM  PATIENT NAME:  Dennis Ayers   AGE: 62 y.o.    PRE-OPERATIVE DIAGNOSIS:  Osteoarthritis LEFT KNEE  POST-OPERATIVE DIAGNOSIS:  Same  PROCEDURE:  Procedure(s): TOTAL KNEE ARTHROPLASTY   SURGEON:  Eulas Post, MD   ASSISTANT:  Janace Litten, OPA-C, present and scrubbed throughout the case, critical for assistance with exposure, retraction, instrumentation, and closure.   OPERATIVE IMPLANTS: Depuy PFC Sigma, fixed bearing Posterior Stabilized.  Femur size 5, Tibia size 5, Patella size 41 3-peg oval button, with a 10 mm polyethylene insert.   PREOPERATIVE INDICATIONS:  Dennis Ayers is a 62 y.o. year old male with end stage bone on bone degenerative arthritis of the knee who failed conservative treatment, including injections, antiinflammatories, activity modification, and assistive devices, and had significant impairment of their activities of daily living, and elected for Total Knee Arthroplasty. He also has morbid obesity, with a BMI of 45.  The risks, benefits, and alternatives were discussed at length including but not limited to the risks of infection, bleeding, nerve injury, stiffness, blood clots, the need for revision surgery, cardiopulmonary complications, among others, and they were willing to proceed.   OPERATIVE DESCRIPTION:  The patient was brought to the operative room and placed in a supine position.  General anesthesia was administered.  IV antibiotics were given.  The lower extremity was prepped and draped in the usual sterile fashion.  Time out was performed.  The leg was elevated and exsanguinated and the tourniquet was inflated.  Anterior quadriceps tendon splitting approach was performed.  The patella was everted and osteophytes were removed.  The anterior horn of the medial and lateral meniscus was removed.   The distal femur was opened with the drill and the intramedullary distal femoral cutting jig was  utilized, set at 5 degrees resecting 10 mm off the distal femur.  Care was taken to protect the collateral ligaments.  Then the extramedullary tibial cutting jig was utilized making the appropriate cut using the anterior tibial crest as a reference building in appropriate posterior slope.  Care was taken during the cut to protect the medial and collateral ligaments.  The proximal tibia was removed along with the posterior horns of the menisci.  The PCL was sacrificed.    The extensor gap was measured and was approximately 10mm.    The distal femoral sizing jig was applied, taking care to avoid notching.  Then the 4-in-1 cutting jig was applied and the anterior and posterior femur was cut, along with the chamfer cuts.  All posterior osteophytes were removed.  The flexion gap was then measured and was symmetric with the extension gap.  I completed the distal femoral preparation using the appropriate jig to prepare the box.  The patella was then measured, and cut with the saw.    The proximal tibia sized and prepared accordingly with the reamer and the punch, and then all components were trialed with the 10mm poly insert.  The knee was found to have excellent balance and full motion.    The above named components were then cemented into place and all excess cement was removed.  The trial polyethylene component was in place during cementation, and then was exchanged for the real polyethylene component.    The knee was easily taken through a range of motion and the patella tracked well and the knee irrigated copiously and the parapatellar and subcutaneous tissue closed with vicryl, and monocryl with steri strips for the  skin.  The wounds were injected with marcaine, and dressed with sterile gauze and the tourniquet released and the patient was awakened and returned to the PACU in stable and satisfactory condition.  There were no complications.  Total tourniquet time was ~120  minutes.

## 2012-07-25 NOTE — Transfer of Care (Signed)
Immediate Anesthesia Transfer of Care Note  Patient: Dennis Ayers  Procedure(s) Performed: Procedure(s): TOTAL KNEE ARTHROPLASTY (Left)  Patient Location: PACU  Anesthesia Type:General  Level of Consciousness: awake, alert  and oriented  Airway & Oxygen Therapy: Patient Spontanous Breathing and Patient connected to nasal cannula oxygen  Post-op Assessment: Report given to PACU RN and Post -op Vital signs reviewed and stable  Post vital signs: Reviewed and stable  Complications: No apparent anesthesia complications

## 2012-07-25 NOTE — Evaluation (Signed)
Physical Therapy Evaluation Patient Details Name: Dennis Ayers MRN: [id number] DOB: 02/11/1951 Today's Date: 07/25/2012 Time: 4098-1191 PT Time Calculation (min): 38 min  PT Assessment / Plan / Recommendation Clinical Impression  Pt is a 62 yo male s/p L TKA. Pt with a great attitude and good recall of pre-operative class exercises and recommendations. Pt has all the necessary home equipment and a supportive wife at home. Pt requiring assistance for transfers and ambulation and pt is limited today by paina nd fatigue, ambulating 8 feet. Pt will benefit from acute PT for strengthening and mobility of the L LE, improving activity tolerance and increased independence with ADLs and transfers. Pt should be able to d/c home when medically stable with 24/7 assistance for OOB mobility and HHPT to address strength, range, OOB mobility and ADL deficits.     PT Assessment  Patient needs continued PT services    Follow Up Recommendations  Home health PT;Supervision/Assistance - 24 hour    Does the patient have the potential to tolerate intense rehabilitation      Barriers to Discharge        Equipment Recommendations       Recommendations for Other Services     Frequency 7X/week    Precautions / Restrictions Precautions Precautions: Knee Precaution Booklet Issued: Yes (comment) Precaution Comments: Pt with good understanding of weightbearing and exercises Restrictions Weight Bearing Restrictions: Yes LLE Weight Bearing: Weight bearing as tolerated   Pertinent Vitals/Pain 2/10 pain in L knee following ambulation      Mobility  Bed Mobility Bed Mobility: Supine to Sit;Sitting - Scoot to Edge of Bed Supine to Sit: 5: Supervision Sitting - Scoot to Edge of Bed: 5: Supervision Details for Bed Mobility Assistance: Pt with good initiation of L LE, supervision for safety Transfers Transfers: Sit to Stand;Stand to Sit Sit to Stand: 3: Mod assist;From bed;With upper extremity  assist Stand to Sit: To elevated surface;To chair/3-in-1;With upper extremity assist;4: Min assist Details for Transfer Assistance: Pt requiring v/c's for hand placement and sequencing and mod A for balance and control during sit to stand Ambulation/Gait Ambulation/Gait Assistance: 4: Min guard Ambulation Distance (Feet): 8 Feet Assistive device: Rolling walker Ambulation/Gait Assistance Details: Min G for safety Gait Pattern: Step-to pattern;Decreased stance time - left;Decreased weight shift to left Gait velocity: slow General Gait Details: Pt with good demo of RW use. Pt reports increased pain with Knee extension prior to weight shift onto L LE Stairs: No    Exercises Total Joint Exercisesn - handout provided Ankle Circles/Pumps: AROM;Both;10 reps;Supine Quad Sets: Strengthening;Both;10 reps;Supine Heel Slides: AROM;10 reps;Left;Supine   PT Diagnosis: Generalized weakness;Difficulty walking  PT Problem List: Decreased strength;Decreased range of motion;Decreased activity tolerance;Decreased balance;Decreased mobility;Decreased coordination;Decreased knowledge of use of DME;Decreased safety awareness;Decreased knowledge of precautions PT Treatment Interventions: DME instruction;Gait training;Stair training;Functional mobility training;Therapeutic activities;Therapeutic exercise;Balance training;Neuromuscular re-education   PT Goals Acute Rehab PT Goals PT Goal Formulation: With patient Time For Goal Achievement: 08/08/12 Potential to Achieve Goals: Good Pt will go Supine/Side to Sit: with modified independence;with HOB not 0 degrees (comment degree) PT Goal: Supine/Side to Sit - Progress: Goal set today Pt will go Sit to Supine/Side: with modified independence;with HOB not 0 degrees (comment degree) PT Goal: Sit to Supine/Side - Progress: Goal set today Pt will go Sit to Stand: with modified independence;with upper extremity assist PT Goal: Sit to Stand - Progress: Goal set  today Pt will go Stand to Sit: with modified independence;with upper extremity assist PT Goal: Stand  to Sit - Progress: Goal set today Pt will Ambulate: 16 - 50 feet;with least restrictive assistive device;with modified independence PT Goal: Ambulate - Progress: Goal set today Pt will Go Up / Down Stairs: 1-2 stairs;with modified independence;with least restrictive assistive device PT Goal: Up/Down Stairs - Progress: Goal set today Pt will Perform Home Exercise Program: Independently PT Goal: Perform Home Exercise Program - Progress: Goal set today  Visit Information  Last PT Received On: 07/25/12 Assistance Needed: +1    Subjective Data  Subjective: Pt recieved in bed agreeable to PT   Prior Functioning  Home Living Lives With: Spouse Available Help at Discharge: Family;Available 24 hours/day Type of Home: House Home Access: Stairs to enter [employer] of Steps: 2-3 Entrance Stairs-Rails: None Home Layout: One level Bathroom Shower/Tub: Walk-in shower;Door [employer] Toilet: Standard Bathroom Accessibility: Yes How Accessible: Accessible via walker Home Adaptive Equipment: Bedside commode/3-in-1;Built-in shower seat;Hand-held shower hose;Straight cane;Walker - rolling;Walker - four wheeled (adjustable bed and lifting recliner, cpm) Prior Function Level of Independence: Independent with assistive device(s) Able to Take Stairs?: Yes Driving: Yes Vocation: Full time employment Comments: Pt was primarily using a cane but takes his rollator for long distances in the community Communication Communication: No difficulties    Cognition  Cognition Arousal/Alertness: Awake/alert Behavior During Therapy: WFL for tasks assessed/performed Overall Cognitive Status: Within Functional Limits for tasks assessed    Extremity/Trunk Assessment Right Upper Extremity Assessment RUE ROM/Strength/Tone: Within functional levels Left Upper Extremity Assessment LUE ROM/Strength/Tone:  Within functional levels Right Lower Extremity Assessment RLE ROM/Strength/Tone: Within functional levels Left Lower Extremity Assessment LLE ROM/Strength/Tone: Unable to fully assess (Day 1 post op) Trunk Assessment Trunk Assessment: Normal   Balance Balance Balance Assessed: Yes Static Sitting Balance Static Sitting - Balance Support: Right upper extremity supported;Left upper extremity supported;Feet supported Static Sitting - Level of Assistance: 5: Stand by assistance Static Sitting - Comment/# of Minutes: 5  End of Session PT - End of Session Equipment Utilized During Treatment: Gait belt Activity Tolerance: Patient limited by pain;Patient limited by fatigue Patient left: in chair;with call bell/phone within reach;with family/visitor present Nurse Communication:  (Tape on pt's IV coming loose) CPM Left Knee CPM Left Knee: Off Left Knee Flexion (Degrees): 60 Left Knee Extension (Degrees): 0 Additional Comments: trapeze bar patient helper  GP    07/25/2012, 5:00 PM Marvis Moeller, Student Physical Therapist Office #: [phone]   Agree with above assessment.  Lewis Shock, PT, DPT Pager #: [phone] Office #: [phone]

## 2012-07-25 NOTE — Anesthesia Preprocedure Evaluation (Signed)
Anesthesia Evaluation  Patient identified by MRN, date of birth, ID band Patient awake    Reviewed: Allergy & Precautions, H&P , NPO status , Patient's Chart, lab work & pertinent test results  Airway Mallampati: I TM Distance: >3 FB Neck ROM: full    Dental   Pulmonary sleep apnea ,          Cardiovascular Rhythm:regular Rate:Normal     Neuro/Psych    GI/Hepatic (+) Hepatitis -  Endo/Other  Morbid obesity  Renal/GU      Musculoskeletal   Abdominal   Peds  Hematology   Anesthesia Other Findings   Reproductive/Obstetrics                           Anesthesia Physical Anesthesia Plan  ASA: III  Anesthesia Plan: General   Post-op Pain Management: MAC Combined w/ Regional for Post-op pain   Induction: Intravenous  Airway Management Planned: Oral ETT  Additional Equipment:   Intra-op Plan:   Post-operative Plan: Extubation in OR  Informed Consent: I have reviewed the patients History and Physical, chart, labs and discussed the procedure including the risks, benefits and alternatives for the proposed anesthesia with the patient or authorized representative who has indicated his/her understanding and acceptance.     Plan Discussed with: CRNA, Anesthesiologist and Surgeon  Anesthesia Plan Comments:         Anesthesia Quick Evaluation

## 2012-07-25 NOTE — Anesthesia Procedure Notes (Addendum)
Anesthesia Regional Block:  Femoral nerve block  Pre-Anesthetic Checklist: ,, timeout performed, Correct Patient, Correct Site, Correct Laterality, Correct Procedure, Correct Position, site marked, Risks and benefits discussed,  Surgical consent,  Pre-op evaluation,  At surgeon's request and post-op pain management  Laterality: Left  Prep: Maximum Sterile Barrier Precautions used, chloraprep and alcohol swabs       Needles:  Injection technique: Single-shot  Needle Type: Stimulator Needle - 80          Additional Needles:  Procedures: nerve stimulator Femoral nerve block  Nerve Stimulator or Paresthesia:  Response: 0.5 mA, 0.1 ms, 8 cm  Additional Responses:   Narrative:  Start time: 07/25/2012 7:00 AM End time: 07/25/2012 7:10 AM Injection made incrementally with aspirations every 5 mL.  Performed by: Personally  Anesthesiologist: Maren Beach MD  Additional Notes: Pt accepts procedure and risks. 25cc 0.5% Marcaine w/ epi w/o difficulty or discomfort. Pt tolerated well. GES   Procedure Name: Intubation Date/Time: 07/25/2012 7:36 AM Performed by: Marena Chancy Pre-anesthesia Checklist: Emergency Drugs available, Patient identified, Timeout performed, Suction available and Patient being monitored Patient Re-evaluated:Patient Re-evaluated prior to inductionOxygen Delivery Method: Circle system utilized Preoxygenation: Pre-oxygenation with 100% oxygen Intubation Type: IV induction Ventilation: Oral airway inserted - appropriate to patient size and Mask ventilation throughout procedure Laryngoscope Size: Miller and 3 Grade View: Grade III Tube type: Oral Tube size: 8.0 mm Number of attempts: 1 Airway Equipment and Method: Bougie stylet Placement Confirmation: ETT inserted through vocal cords under direct vision,  breath sounds checked- equal and bilateral and positive ETCO2 Secured at: 24 cm Tube secured with: Tape Dental Injury: Teeth and Oropharynx as per  pre-operative assessment

## 2012-07-25 NOTE — Anesthesia Postprocedure Evaluation (Signed)
  Anesthesia Post-op Note  Patient: Dennis Ayers  Procedure(s) Performed: Procedure(s): TOTAL KNEE ARTHROPLASTY (Left)  Patient Location: PACU  Anesthesia Type:GA combined with regional for post-op pain  Level of Consciousness: awake, alert , oriented and patient cooperative  Airway and Oxygen Therapy: Patient Spontanous Breathing  Post-op Pain: mild  Post-op Assessment: Post-op Vital signs reviewed, Patient's Cardiovascular Status Stable, Respiratory Function Stable, Patent Airway, No signs of Nausea or vomiting and Pain level controlled  Post-op Vital Signs: stable  Complications: No apparent anesthesia complications

## 2012-07-25 NOTE — H&P (Signed)
PREOPERATIVE H&P  Chief Complaint: DJD LEFT KNEE  HPI: Dennis Ayers is a 62 y.o. male who presents for preoperative history and physical with a diagnosis of DJD LEFT KNEE. Symptoms are rated as moderate to severe, and have been worsening.  This is significantly impairing activities of daily living.  He has elected for surgical management. He has failed nsaids, injections, activity modification, bracing, and weight loss, and use of assistive ambulatory devices.  Past Medical History  Diagnosis Date  . Hyperglycemia   . Benign neoplasm of skin, site unspecified   . Morbid obesity   . Knee pain   . Jaundice     when very young, treated and resolved  . Hepatitis     has had jaundice in past, unsure of cause  . DJD (degenerative joint disease) of knee   . OSA (obstructive sleep apnea)     mild to mod, working on weight loss   Past Surgical History  Procedure Laterality Date  . 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 Dorene Grebe  . Cholecystectomy  1998  . Tonsillectomy    . Colonoscopy  08/20/2011    Procedure: COLONOSCOPY;  Surgeon: Iva Boop, MD;  Location: [facility];  Service: Endoscopy - adenomatous polyps found  . Eye surgery Right     laser   History   Social History  . Marital Status: Married    Spouse Name: N/A    Number of Children: 1  . Years of Education: N/A   Occupational History  . Self-employed    Social History Main Topics  . Smoking status: Never Smoker   . Smokeless tobacco: Never Used  . Alcohol Use: No     Comment: occasionally in past, not anymore for 20 years  . Drug Use: No  . Sexually Active: None   Other Topics Concern  . None   Social History Narrative   Caffeine: 2-3 diet sodas/day   Lives with wife   1 grown son.   Occupation: Self-employed; Administrator   Activity: some golf, limited by knee pain   Diet: good water, daily vegetables   Family History  Problem Relation Age of Onset  . Coronary  artery disease Father 48    CABG X4  . Hypertension Father   . Ovarian cancer Mother   . Ulcers Mother     stomach  . Diabetes Neg Hx   . Prostate cancer Neg Hx   . Breast cancer Neg Hx   . Colon cancer Neg Hx   . Depression Neg Hx   . Alcohol abuse Neg Hx   . Stroke Father    No Known Allergies Prior to Admission medications   Medication Sig Start Date End Date Taking? Authorizing Provider  ibuprofen (ADVIL,MOTRIN) 200 MG tablet Take 200 mg by mouth every 6 (six) hours as needed for pain.   Yes Historical Provider, MD  Boris Lown Oil 300 MG CAPS Take 300 mg by mouth daily.   Yes Historical Provider, MD  Misc Natural Products (OSTEO BI-FLEX JOINT SHIELD PO) Take 2 capsules by mouth daily.   Yes Historical Provider, MD  Multiple Vitamin (MULTIVITAMIN) tablet Take 1 tablet by mouth daily.   Yes Historical Provider, MD     Positive ROS: All other systems have been reviewed and were otherwise negative with the exception of those mentioned in the HPI and as above.  Physical Exam: General: Alert, no acute distress Cardiovascular: No pedal edema Respiratory: No cyanosis, no  use of accessory musculature GI: No organomegaly, abdomen is soft and non-tender Skin: No lesions in the area of chief complaint Neurologic: Sensation intact distally Psychiatric: Patient is competent for consent with normal mood and affect Lymphatic: No axillary or cervical lymphadenopathy  MUSCULOSKELETAL: left knee with crepitance and loss of motion with pain.  Assessment: DJD LEFT KNEE  Plan: Plan for Procedure(s): TOTAL KNEE ARTHROPLASTY  The risks benefits and alternatives were discussed with the patient including but not limited to the risks of nonoperative treatment, versus surgical intervention including infection, bleeding, nerve injury,  blood clots, cardiopulmonary complications, morbidity, mortality, among others, and they were willing to proceed.   Eulas Post, MD Cell [phone]   07/25/2012 7:13 AM

## 2012-07-25 NOTE — Evaluation (Signed)
Physical Therapy Evaluation Patient Details Name: Dennis Ayers MRN: [id number] DOB: [birth year] Today's Date: 07/25/2012 Time: 1191-4782 PT Time Calculation (min): 38 min  PT Assessment / Plan / Recommendation Clinical Impression  Pt is a 62 yo male s/p L TKA. Pt with a great attitude and good recall of pre-operative class exercises and recommendations. Pt has all the necessary home equipment and a supportive wife at home. Pt requiring assistance for transfers and ambulation and pt is limited today by paina nd fatigue, ambulating 8 feet. Pt will benefit from acute PT for strengthening and mobility of the L LE, improving activity tolerance and increased independence with ADLs and transfers. Pt should be able to d/c home when medically stable with 24/7 assistance for OOB mobility and HHPT to address strength, range, OOB mobility and ADL deficits.     PT Assessment  Patient needs continued PT services    Follow Up Recommendations  Home health PT;Supervision/Assistance - 24 hour    Does the patient have the potential to tolerate intense rehabilitation      Barriers to Discharge        Equipment Recommendations       Recommendations for Other Services     Frequency 7X/week    Precautions / Restrictions Precautions Precautions: Knee Precaution Booklet Issued: Yes (comment) Precaution Comments: Pt with good understanding of weightbearing and exercises Restrictions Weight Bearing Restrictions: Yes LLE Weight Bearing: Weight bearing as tolerated   Pertinent Vitals/Pain 2/10 pain in L knee following ambulation      Mobility  Bed Mobility Bed Mobility: Supine to Sit;Sitting - Scoot to Edge of Bed Supine to Sit: 5: Supervision Sitting - Scoot to Edge of Bed: 5: Supervision Details for Bed Mobility Assistance: Pt with good initiation of L LE, supervision for safety Transfers Transfers: Sit to Stand;Stand to Sit Sit to Stand: 3: Mod assist;From bed;With upper extremity  assist Stand to Sit: To elevated surface;To chair/3-in-1;With upper extremity assist;4: Min assist Details for Transfer Assistance: Pt requiring v/c's for hand placement and sequencing and mod A for balance and control during sit to stand Ambulation/Gait Ambulation/Gait Assistance: 4: Min guard Ambulation Distance (Feet): 8 Feet Assistive device: Rolling walker Ambulation/Gait Assistance Details: Min G for safety Gait Pattern: Step-to pattern;Decreased stance time - left;Decreased weight shift to left Gait velocity: slow General Gait Details: Pt with good demo of RW use. Pt reports increased pain with Knee extension prior to weight shift onto L LE Stairs: No    Exercises Total Joint Exercises Ankle Circles/Pumps: AROM;Both;10 reps;Supine Quad Sets: Strengthening;Both;10 reps;Supine Heel Slides: AROM;10 reps;Left;Supine   PT Diagnosis: Generalized weakness;Difficulty walking  PT Problem List: Decreased strength;Decreased range of motion;Decreased activity tolerance;Decreased balance;Decreased mobility;Decreased coordination;Decreased knowledge of use of DME;Decreased safety awareness;Decreased knowledge of precautions PT Treatment Interventions: DME instruction;Gait training;Stair training;Functional mobility training;Therapeutic activities;Therapeutic exercise;Balance training;Neuromuscular re-education   PT Goals Acute Rehab PT Goals PT Goal Formulation: With patient Time For Goal Achievement: 08/08/12 Potential to Achieve Goals: Good Pt will go Supine/Side to Sit: with modified independence;with HOB not 0 degrees (comment degree) PT Goal: Supine/Side to Sit - Progress: Goal set today Pt will go Sit to Supine/Side: with modified independence;with HOB not 0 degrees (comment degree) PT Goal: Sit to Supine/Side - Progress: Goal set today Pt will go Sit to Stand: with modified independence;with upper extremity assist PT Goal: Sit to Stand - Progress: Goal set today Pt will go Stand to  Sit: with modified independence;with upper extremity assist PT Goal: Stand to Sit -  Progress: Goal set today Pt will Ambulate: 16 - 50 feet;with least restrictive assistive device;with modified independence PT Goal: Ambulate - Progress: Goal set today Pt will Go Up / Down Stairs: 1-2 stairs;with modified independence;with least restrictive assistive device PT Goal: Up/Down Stairs - Progress: Goal set today Pt will Perform Home Exercise Program: Independently PT Goal: Perform Home Exercise Program - Progress: Goal set today  Visit Information  Last PT Received On: 07/25/12 Assistance Needed: +1    Subjective Data  Subjective: Pt recieved in bed agreeable to PT   Prior Functioning  Home Living Lives With: Spouse Available Help at Discharge: Family;Available 24 hours/day Type of Home: House Home Access: Stairs to enter [employer] of Steps: 2-3 Entrance Stairs-Rails: None Home Layout: One level Bathroom Shower/Tub: Walk-in shower;Door [employer] Toilet: Standard Bathroom Accessibility: Yes How Accessible: Accessible via walker Home Adaptive Equipment: Bedside commode/3-in-1;Built-in shower seat;Hand-held shower hose;Straight cane;Walker - rolling;Walker - four wheeled (adjustable bed and lifting recliner, cpm) Prior Function Level of Independence: Independent with assistive device(s) Able to Take Stairs?: Yes Driving: Yes Vocation: Full time employment Comments: Pt was primarily using a cane but takes his rollator for long distances in the community Communication Communication: No difficulties    Cognition  Cognition Arousal/Alertness: Awake/alert Behavior During Therapy: WFL for tasks assessed/performed Overall Cognitive Status: Within Functional Limits for tasks assessed    Extremity/Trunk Assessment Right Upper Extremity Assessment RUE ROM/Strength/Tone: Within functional levels Left Upper Extremity Assessment LUE ROM/Strength/Tone: Within functional  levels Right Lower Extremity Assessment RLE ROM/Strength/Tone: Within functional levels Left Lower Extremity Assessment LLE ROM/Strength/Tone: Unable to fully assess (Day 1 post op) Trunk Assessment Trunk Assessment: Normal   Balance Balance Balance Assessed: Yes Static Sitting Balance Static Sitting - Balance Support: Right upper extremity supported;Left upper extremity supported;Feet supported Static Sitting - Level of Assistance: 5: Stand by assistance Static Sitting - Comment/# of Minutes: 5  End of Session PT - End of Session Equipment Utilized During Treatment: Gait belt Activity Tolerance: Patient limited by pain;Patient limited by fatigue Patient left: in chair;with call bell/phone within reach;with family/visitor present Nurse Communication:  (Tape on pt's IV coming loose) CPM Left Knee CPM Left Knee: Off Left Knee Flexion (Degrees): 60 Left Knee Extension (Degrees): 0 Additional Comments: trapeze bar patient helper  GP    07/25/2012, 5:00 PM Marvis Moeller, Student Physical Therapist Office #: [phone]

## 2012-07-25 NOTE — Addendum Note (Signed)
Addendum created 07/25/12 1129 by Marena Chancy, CRNA   Modules edited: Anesthesia Blocks and Procedures

## 2012-07-26 LAB — CBC
MCV: 91.4 fL (ref 78.0–100.0)
Platelets: 222 10*3/uL (ref 150–400)
RBC: 4.64 MIL/uL (ref 4.22–5.81)
RDW: 13.2 % (ref 11.5–15.5)
WBC: 8.4 10*3/uL (ref 4.0–10.5)

## 2012-07-26 LAB — BASIC METABOLIC PANEL
CO2: 28 mEq/L (ref 19–32)
Chloride: 100 mEq/L (ref 96–112)
Creatinine, Ser: 0.94 mg/dL (ref 0.50–1.35)
GFR calc Af Amer: >90 mL/min (ref >90)
Potassium: 4.5 mEq/L (ref 3.5–5.1)
Sodium: 134 mEq/L — ABNORMAL LOW (ref 135–145)

## 2012-07-26 LAB — GLUCOSE, CAPILLARY
Glucose-Capillary: 138 mg/dL — ABNORMAL HIGH (ref 70–99)
Glucose-Capillary: 134 mg/dL — ABNORMAL HIGH (ref 70–99)

## 2012-07-26 MED ORDER — WHITE PETROLATUM GEL
Status: AC
  Filled 2012-07-26: qty 5

## 2012-07-26 NOTE — Progress Notes (Signed)
Physical Therapy Treatment Patient Details Name: Dennis Ayers MRN: [id number] DOB: [birth year] Today's Date: 07/26/2012 Time: 1451-1530 PT Time Calculation (min): 39 min  PT Assessment / Plan / Recommendation Comments on Treatment Session  Pt continues to progress well with all mobiltiy     Follow Up Recommendations  Home health PT;Supervision/Assistance - 24 hour     Does the patient have the potential to tolerate intense rehabilitation     Barriers to Discharge        Equipment Recommendations       Recommendations for Other Services    Frequency 7X/week   Plan Discharge plan remains appropriate    Precautions / Restrictions Precautions Precautions: Knee Precaution Comments: Pt with good understanding of weightbearing and exercises Restrictions Weight Bearing Restrictions: No LLE Weight Bearing: Weight bearing as tolerated   Pertinent Vitals/Pain 7/10, stated he would ask for pain meds after session.    Mobility  Bed Mobility Bed Mobility: Supine to Sit;Sit to Supine Supine to Sit: 4: Min guard Sit to Supine: 4: Min assist;HOB flat Details for Bed Mobility Assistance: Guarding assist for LLE out of bed this afternoon with min assist for leg back into bed and min cues for hand placement to self assist.  Transfers Transfers: Sit to Stand;Stand to Sit Sit to Stand: From elevated surface;With upper extremity assist;From bed;4: Min assist Stand to Sit: With upper extremity assist;With armrests;To chair/3-in-1;4: Min guard Details for Transfer Assistance: Assist to rise and steady, as bed in somewhat lower position with cues for hand placement and LE management.  Ambulation/Gait Ambulation/Gait Assistance: 4: Min guard Assistive device: Rolling walker Ambulation/Gait Assistance Details: Again, min cues for increasing UE or LLE WB to decrease antalgic gait pattern.   Gait Pattern: Step-to pattern;Decreased stance time - left;Decreased weight shift to left Gait velocity:  slow Stairs: No    Exercises Total Joint Exercises Ankle Circles/Pumps: AROM;Both;Other reps (comment) (30 reps) Quad Sets: AROM;Left;10 reps;Strengthening (with towel roll under ankle.) Heel Slides: AAROM;Left;10 reps Hip ABduction/ADduction: AAROM;Left;10 reps Straight Leg Raises: AAROM;Left;10 reps   PT Diagnosis:    PT Problem List:   PT Treatment Interventions:     PT Goals Acute Rehab PT Goals PT Goal Formulation: With patient Time For Goal Achievement: 08/08/12 Potential to Achieve Goals: Good Pt will go Supine/Side to Sit: with modified independence;with HOB not 0 degrees (comment degree) PT Goal: Supine/Side to Sit - Progress: Progressing toward goal Pt will go Sit to Supine/Side: with modified independence;with HOB not 0 degrees (comment degree) PT Goal: Sit to Supine/Side - Progress: Progressing toward goal Pt will go Sit to Stand: with modified independence;with upper extremity assist PT Goal: Sit to Stand - Progress: Progressing toward goal Pt will go Stand to Sit: with modified independence;with upper extremity assist PT Goal: Stand to Sit - Progress: Progressing toward goal Pt will Ambulate: with least restrictive assistive device;with modified independence;51 - 150 feet PT Goal: Ambulate - Progress: Updated due to goal met Pt will Perform Home Exercise Program: Independently PT Goal: Perform Home Exercise Program - Progress: Progressing toward goal  Visit Information  Last PT Received On: 07/26/12 Assistance Needed: +1    Subjective Data  Subjective: I want to do as much as I can.  Patient Stated Goal: to return home and be able to play golf again.    Cognition  Cognition Arousal/Alertness: Awake/alert Behavior During Therapy: WFL for tasks assessed/performed Overall Cognitive Status: Within Functional Limits for tasks assessed    Balance  End of Session PT - End of Session Equipment Utilized During Treatment: Gait belt Activity Tolerance: Patient  tolerated treatment well Patient left: in chair;with call bell/phone within reach;with family/visitor present   GP     Vista Deck 07/26/2012, 3:46 PM

## 2012-07-26 NOTE — Progress Notes (Signed)
UR COMPLETED  

## 2012-07-26 NOTE — Progress Notes (Signed)
Physical Therapy Treatment Patient Details Name: Dennis Ayers MRN: [id number] DOB: 21-Apr-1950 Today's Date: 07/26/2012 Time: 1003-1040 PT Time Calculation (min): 37 min  PT Assessment / Plan / Recommendation Comments on Treatment Session  Pt progressing well with mobility and was able to tolerate increased ambulation distance and exercises.  Discussed performing stairs during session as well.     Follow Up Recommendations  Home health PT;Supervision/Assistance - 24 hour     Does the patient have the potential to tolerate intense rehabilitation     Barriers to Discharge        Equipment Recommendations       Recommendations for Other Services    Frequency 7X/week   Plan Discharge plan remains appropriate    Precautions / Restrictions Precautions Precautions: Knee Precaution Comments: Pt with good understanding of weightbearing and exercises Restrictions Weight Bearing Restrictions: No LLE Weight Bearing: Weight bearing as tolerated   Pertinent Vitals/Pain 3/10 pain with WBing.  Applied ice packs prior to leaving room.     Mobility  Bed Mobility Bed Mobility: Supine to Sit Supine to Sit: 4: Min assist Details for Bed Mobility Assistance: Assist for LLE out of bed with min cues for hand placement to self assist.  Transfers Transfers: Sit to Stand;Stand to Sit Sit to Stand: 3: Mod assist;From elevated surface;With upper extremity assist;From bed Stand to Sit: 4: Min assist;With upper extremity assist;With armrests;To chair/3-in-1 Details for Transfer Assistance: Assist to rise and steady, as bed was locked in somewhat lower position with cues for hand placement and LE management.  Ambulation/Gait Ambulation/Gait Assistance: 4: Min guard Ambulation Distance (Feet): 90 Feet Assistive device: Rolling walker Ambulation/Gait Assistance Details: Cues for sequencing/technique with RW and to increase WB in UEs and LLE to decrease antalgic gait pattern.  Gait Pattern: Step-to  pattern;Decreased stance time - left;Decreased weight shift to left Gait velocity: slow Stairs: No    Exercises Total Joint Exercises Ankle Circles/Pumps: AROM;Both;Other reps (comment) (30 reps) Quad Sets: AROM;Left;10 reps;Strengthening (with towel roll under ankle.) Heel Slides: AAROM;Left;10 reps Hip ABduction/ADduction: AAROM;Left;10 reps Straight Leg Raises: AAROM;Left;10 reps Goniometric ROM: pt with approx 75 deg knee flex   PT Diagnosis:    PT Problem List:   PT Treatment Interventions:     PT Goals Acute Rehab PT Goals PT Goal Formulation: With patient Time For Goal Achievement: 08/08/12 Potential to Achieve Goals: Good Pt will go Supine/Side to Sit: with modified independence;with HOB not 0 degrees (comment degree) PT Goal: Supine/Side to Sit - Progress: Progressing toward goal Pt will go Sit to Stand: with modified independence;with upper extremity assist PT Goal: Sit to Stand - Progress: Progressing toward goal Pt will go Stand to Sit: with modified independence;with upper extremity assist PT Goal: Stand to Sit - Progress: Progressing toward goal Pt will Ambulate: 16 - 50 feet;with least restrictive assistive device;with modified independence PT Goal: Ambulate - Progress: Progressing toward goal Pt will Perform Home Exercise Program: Independently PT Goal: Perform Home Exercise Program - Progress: Progressing toward goal  Visit Information  Last PT Received On: 07/26/12 Assistance Needed: +1    Subjective Data  Subjective: It doesn't hurt as much today.  Patient Stated Goal: to return home and be able to play golf again.    Cognition  Cognition Arousal/Alertness: Awake/alert Behavior During Therapy: WFL for tasks assessed/performed Overall Cognitive Status: Within Functional Limits for tasks assessed    Balance     End of Session PT - End of Session Equipment Utilized During Treatment: Gait  belt Activity Tolerance: Patient tolerated treatment  well Patient left: in chair;with call bell/phone within reach;with family/visitor present CPM Left Knee Additional Comments: trapeze bar patient helper   GP     Vista Deck 07/26/2012, 10:48 AM

## 2012-07-26 NOTE — Progress Notes (Signed)
Inpatient Diabetes Program Recommendations  AACE/ADA: New Consensus Statement on Inpatient Glycemic Control (2013)  Target Ranges:  Prepandial:   less than 140 mg/dL      Peak postprandial:   less than 180 mg/dL (1-2 hours)      Critically ill patients:  140 - 180 mg/dL   Results for [employer] (MRN [id number]) as of 07/26/2012 14:09  Ref. Range 07/25/2012 05:58 07/25/2012 17:03 07/25/2012 21:20 07/26/2012 06:35 07/26/2012 11:09  Glucose-Capillary Latest Range: 70-99 mg/dL 782 (H) 956 (H) 213 (H) 138 (H) 134 (H)    Inpatient Diabetes Program Recommendations HgbA1C: Please consider ordering an A1C to determine glycemic control over the past 2-3 months.  Note: Patient does not have a documented history of diabetes diagnosis but has a prior history of hyperglycemia.  Patient's initial blood glucose (prior to receiving Dexamethasone 4mg ) was 160 mg/dl on 0/8/65.  There are not any lab results for an A1C in the chart.  Please order an A1C to determine glycemic control over the past 2-3 months.  If the A1C is greater than 6.5%, then diagnosis criteria would be met for diabetes diagnosis per ADA criteria.  If patient is diagnosed with diabetes based off of A1C, please consult diabetes educator to ensure that patient is properly educated.  Will continue to follow as an inpatient.  Thanks, Orlando Penner, RN, MSN, CCRN Diabetes Coordinator Inpatient Diabetes Program [phone]

## 2012-07-26 NOTE — Progress Notes (Signed)
Patient ID: Dennis Ayers, male   DOB: Sep 01, 1950, 62 y.o.   MRN: [id number]     Subjective:  Patient reports pain as mild.  Patient states that he likes the CPM and that his knee feels better in the CPM  Objective:   VITALS:   Filed Vitals:   07/25/12 1700 07/25/12 1800 07/25/12 2125 07/26/12 0614  BP: 121/65 123/68 130/71 133/74  Pulse: 108 113 103 87  Temp: 98 F (36.7 C) 98.4 F (36.9 C) 98.4 F (36.9 C) 98.7 F (37.1 C)  TempSrc:      Resp: 17 20 18 16   Height:  6' 3.5" (1.918 m)    Weight:  160.573 kg (354 lb)    SpO2: 95% 93% 97% 100%    ABD soft Sensation intact distally Dorsiflexion/Plantar flexion intact Incision: dressing C/D/I and no drainage   Lab Results  Component Value Date   WBC 8.4 07/26/2012   HGB 14.5 07/26/2012   HCT 42.4 07/26/2012   MCV 91.4 07/26/2012   PLT 222 07/26/2012     Assessment/Plan: 1 Day Post-Op   Principal Problem:   Osteoarthritis of left knee Active Problems:   Severe obesity (BMI >= 40)   Advance diet Up with therapy Plan for discharge tomorrow WBAT    Dennis Ayers, Dennis Ayers 07/26/2012, 12:00 PM   Dennis Lucy, MD Cell [phone]

## 2012-07-27 ENCOUNTER — Encounter (HOSPITAL_COMMUNITY): Payer: Self-pay | Admitting: Orthopedic Surgery

## 2012-07-27 LAB — CBC
HCT: 38.8 % — ABNORMAL LOW (ref 39.0–52.0)
Hemoglobin: 13.5 g/dL (ref 13.0–17.0)
MCH: 32 pg (ref 26.0–34.0)
MCHC: 34.8 g/dL (ref 30.0–36.0)
MCV: 91.9 fL (ref 78.0–100.0)
RBC: 4.22 MIL/uL (ref 4.22–5.81)

## 2012-07-27 LAB — GLUCOSE, CAPILLARY
Glucose-Capillary: 150 mg/dL — ABNORMAL HIGH (ref 70–99)
Glucose-Capillary: 135 mg/dL — ABNORMAL HIGH (ref 70–99)

## 2012-07-27 NOTE — Progress Notes (Signed)
Physical Therapy Treatment Patient Details Name: Dennis Ayers MRN: [id number] DOB: Jul 08, 1950 Today's Date: 07/27/2012 Time: 4098-1191 PT Time Calculation (min): 39 min  PT Assessment / Plan / Recommendation Comments on Treatment Session  Pt having increased difficulty with pain today per his report. Decreased ambulation distance noted, unable to attempt stairs this morning. Had wife call son to have two people present for stairs education this afternoon. If stairs are difficult for pt this afternoon he may benefit from another day of therapy to promote safe return home.    Follow Up Recommendations  Home health PT;Supervision/Assistance - 24 hour        Barriers to Discharge  Stairs      Equipment Recommendations  Rolling walker with 5" wheels       Frequency 7X/week   Plan Discharge plan remains appropriate    Precautions / Restrictions Precautions Precautions: Knee Precaution Comments: Pt with good understanding of weightbearing and exercises Restrictions Weight Bearing Restrictions: No LLE Weight Bearing: Weight bearing as tolerated   Pertinent Vitals/Pain 6/10 pain Lt. Knee, premedicated, RN made aware    Mobility  Bed Mobility Bed Mobility: Supine to Sit;Sit to Supine Supine to Sit: 4: Min assist Sitting - Scoot to Edge of Bed: 5: Supervision Details for Bed Mobility Assistance: Assist needed for advancing LLE out of bed this morning min cues for sequencing and assistance. Pt guarded and moves slowly  Transfers Transfers: Sit to Stand;Stand to Sit Sit to Stand: From elevated surface;With upper extremity assist;From bed;4: Min assist Stand to Sit: With upper extremity assist;With armrests;To chair/3-in-1;4: Min guard Details for Transfer Assistance: Assist to rise, multiple attempts needed as pt trying out different hand placements, Bed elevated to bed height at home per pt request. Decreased control of descent to chair resulting in increased knee pain with sitting.  Cues for placing Lt. LE anterior to Rt. prior to sitting. Ambulation/Gait Ambulation/Gait Assistance: 4: Min guard Ambulation Distance (Feet): 30 Feet Assistive device: Rolling walker Ambulation/Gait Assistance Details: Cues for UE weight bearing, core and glute contraction for pain modulation and stability. Pt with difficulty advancing Lt. LE today.  Gait Pattern: Step-to pattern;Decreased stance time - left;Decreased weight shift to left (min Lt. foot clearance) Gait velocity: slow Stairs: No    Exercises Total Joint Exercises Ankle Circles/Pumps: AROM;Both;Other reps (comment) (30 reps) Quad Sets: AROM;Left;10 reps;Strengthening (with towel roll under ankle.) Heel Slides: AAROM;Left;10 reps Hip ABduction/ADduction: AAROM;Left;10 reps Straight Leg Raises: AAROM;Left;10 reps (with towel assist from pt) Knee Flexion: PROM;Seated;Left (3 x 2 min holds)     PT Goals Acute Rehab PT Goals PT Goal Formulation: With patient Time For Goal Achievement: 08/08/12 Potential to Achieve Goals: Good Pt will go Supine/Side to Sit: with modified independence;with HOB not 0 degrees (comment degree) PT Goal: Supine/Side to Sit - Progress: Progressing toward goal Pt will go Sit to Supine/Side: with modified independence;with HOB not 0 degrees (comment degree) PT Goal: Sit to Supine/Side - Progress: Progressing toward goal Pt will go Sit to Stand: with modified independence;with upper extremity assist PT Goal: Sit to Stand - Progress: Progressing toward goal Pt will go Stand to Sit: with modified independence;with upper extremity assist PT Goal: Stand to Sit - Progress: Progressing toward goal Pt will Ambulate: with least restrictive assistive device;with modified independence;51 - 150 feet PT Goal: Ambulate - Progress: Not progressing Pt will Go Up / Down Stairs: 1-2 stairs;with modified independence;with least restrictive assistive device PT Goal: Up/Down Stairs - Progress: Not progressing Pt will  Perform Home Exercise Program: Independently PT Goal: Perform Home Exercise Program - Progress: Progressing toward goal  Visit Information  Last PT Received On: 07/27/12 Assistance Needed: +1    Subjective Data  Subjective: I'm hurting worse today   Cognition  Cognition Arousal/Alertness: Awake/alert Behavior During Therapy: WFL for tasks assessed/performed Overall Cognitive Status: Within Functional Limits for tasks assessed       End of Session PT - End of Session Equipment Utilized During Treatment: Gait belt Activity Tolerance: Patient limited by pain;Patient tolerated treatment well Patient left: in chair;with call bell/phone within reach;with family/visitor present CPM Left Knee CPM Left Knee: Off   GP     Sherrine Maples Cheek 07/27/2012, 9:14 AM

## 2012-07-27 NOTE — Progress Notes (Signed)
Dennis Ayers ID: Dennis Ayers, male   DOB: 07-28-1950, 62 y.o.   MRN: [id number]     Subjective:  Dennis Ayers reports pain as mild to moderate.  Dennis Ayers states that he had a rough night and also had some fevers.  Feeling better today.  Objective:   VITALS:   Filed Vitals:   07/26/12 2304 07/27/12 0134 07/27/12 0458 07/27/12 0550  BP:    140/72  Pulse:    98  Temp: 99.6 F (37.6 C) 99.4 F (37.4 C) 99.3 F (37.4 C) 99 F (37.2 C)  TempSrc:      Resp:    18  Height:      Weight:      SpO2:    94%    ABD soft Sensation intact distally Dorsiflexion/Plantar flexion intact Incision: dressing C/D/I and no drainage Wound clean and dry no sign of infection. No fever so far today.   Lab Results  Component Value Date   WBC 8.4 07/26/2012   HGB 14.5 07/26/2012   HCT 42.4 07/26/2012   MCV 91.4 07/26/2012   PLT 222 07/26/2012     Assessment/Plan: 2 Days Post-Op   Principal Problem:   Osteoarthritis of left knee Active Problems:   Severe obesity (BMI >= 40)   Advance diet Up with therapy Plan for discharge tomorrow Continue to monitor temp  Have Dennis Ayers work with PT on stairs.   OLNEY, MONIER 07/27/2012, 7:40 AM   Teryl Lucy, MD Cell [phone]

## 2012-07-27 NOTE — Evaluation (Signed)
Occupational Therapy Evaluation Patient Details Name: Dennis Ayers MRN: [id number] DOB: 08/24/1950 Today's Date: 07/27/2012 Time: 1478-2956 OT Time Calculation (min): 48 min   OT Assessment / Plan / Recommendation Clinical Impression    Pt is a 62 yo male s/p L TKA. Pt has all the necessary home equipment and a supportive wife at home. Pt and wife feel comfortable with information covered by OT and do not feel the need for HHOT.      OT Assessment  Patient does not need any further OT services    Follow Up Recommendations  No OT follow up;Supervision/Assistance - 24 hour    Barriers to Discharge      Equipment Recommendations  None recommended by OT    Recommendations for Other Services    Frequency       Precautions / Restrictions Precautions Precautions: Knee Restrictions Weight Bearing Restrictions: No LLE Weight Bearing: Weight bearing as tolerated   Pertinent Vitals/Pain Pain 7-8/10 in knee when moving foot rest of recliner. Repositioned. Nurse gave meds at beginning of session.     ADL  Eating/Feeding: Independent Where Assessed - Eating/Feeding: Chair Grooming: Set up Where Assessed - Grooming: Supported sitting Upper Body Bathing: Set up Where Assessed - Upper Body Bathing: Supported sitting Lower Body Bathing: Minimal assistance Where Assessed - Lower Body Bathing: Supported sit to stand Upper Body Dressing: Set up Where Assessed - Upper Body Dressing: Supported sitting Lower Body Dressing: Minimal assistance Where Assessed - Lower Body Dressing: Supported sit to Pharmacist, hospital: Minimal Dentist Method: Sit to Barista: Raised toilet seat with arms (or 3-in-1 over toilet) Tub/Shower Transfer: Simulated;Minimal assistance Tub/Shower Transfer Method: Science writer: Walk in shower;Other (comment) (3 in 1) Equipment Used: Gait belt;Reacher;Rolling walker;Sock aid ADL Comments: Pt  practiced using AE to don underwear and sock. Pt at Min A level to help get underwear under heel of foot. Pt practiced simulated shower transfer at Min A level. Wife present for session. Answered any questions they had.    OT Diagnosis:    OT Problem List:   OT Treatment Interventions:     OT Goals    Visit Information  Last OT Received On: 07/27/12 Assistance Needed: +1 PT/OT Co-Evaluation/Treatment: Yes    Subjective Data      Prior Functioning     Home Living Lives With: Spouse Available Help at Discharge: Family;Available 24 hours/day Type of Home: House Home Access: Stairs to enter [employer] of Steps: 2-3 Entrance Stairs-Rails: None Home Layout: One level Bathroom Shower/Tub: Walk-in shower;Door [employer] Toilet: Standard Bathroom Accessibility: Yes How Accessible: Accessible via walker Home Adaptive Equipment: Bedside commode/3-in-1;Built-in shower seat;Hand-held shower hose;Straight cane;Walker - rolling;Walker - four wheeled;Sock aid;Reacher;Long-handled shoehorn;Long-handled sponge (adjustable bed and lifting recliner, cpm) Prior Function Level of Independence: Independent with assistive device(s) Able to Take Stairs?: Yes Driving: Yes Vocation: Full time employment Comments: Pt was primarily using a cane but takes his rollator for long distances in the community Communication Communication: No difficulties         Vision/Perception     Cognition  Cognition Arousal/Alertness: Awake/alert Behavior During Therapy: WFL for tasks assessed/performed Overall Cognitive Status: Within Functional Limits for tasks assessed    Extremity/Trunk Assessment Right Upper Extremity Assessment RUE ROM/Strength/Tone: [facility] for tasks assessed Left Upper Extremity Assessment LUE ROM/Strength/Tone: WFL for tasks assessed     Mobility Bed Mobility Bed Mobility: Not assessed Supine to Sit: 4: Min assist Sitting - Scoot to Edge of Bed:  5: Supervision Details  for Bed Mobility Assistance: Assist needed for advancing LLE out of bed, min cues for sequencing and assistance. Pt guarded and moves slowly.  Transfers Transfers: Sit to Stand;Stand to Sit Sit to Stand: 4: Min assist;With upper extremity assist;From bed;From chair/3-in-1 Stand to Sit: 4: Min guard;With upper extremity assist;To chair/3-in-1;With armrests Details for Transfer Assistance: Min A for sit to stand transfer. Cues for positioning of Lt. LE when sitting.      Exercise     Balance     End of Session OT - End of Session Equipment Utilized During Treatment: Gait belt Activity Tolerance: Patient tolerated treatment well Patient left: in chair;with call bell/phone within reach;with family/visitor present  GO     Earlie Raveling OTR/L [phone] 07/27/2012, 3:11 PM

## 2012-07-28 LAB — GLUCOSE, CAPILLARY: Glucose-Capillary: 141 mg/dL — ABNORMAL HIGH (ref 70–99)

## 2012-07-28 LAB — CBC
HCT: 37.6 % — ABNORMAL LOW (ref 39.0–52.0)
Hemoglobin: 13 g/dL (ref 13.0–17.0)
MCHC: 34.6 g/dL (ref 30.0–36.0)
RBC: 4.11 MIL/uL — ABNORMAL LOW (ref 4.22–5.81)
WBC: 7 10*3/uL (ref 4.0–10.5)

## 2012-07-28 NOTE — Care Management Note (Signed)
CARE MANAGEMENT NOTE 07/28/2012  Patient:  Dennis Ayers, Dennis Ayers   Account Number:  [id number]  Date Initiated:  07/28/2012  Documentation initiated by:  Vance Peper  Subjective/Objective Assessment:   62 yr old male s/p left total knee arthroplasty.     Action/Plan:   CM spoke with patient and wife concerning home health and DME needs at discharge. Choice offered. Patient has rolling walker, 3in1 and CPM has been delivered to the home.   Anticipated DC Date:  07/28/2012   Anticipated DC Plan:  HOME W HOME HEALTH SERVICES      DC Planning Services  CM consult      [facility] Choice  HOME HEALTH  DURABLE MEDICAL EQUIPMENT   Choice offered to / List presented to:  C-1 Patient      DME agency  [employer]     HH arranged  HH-2 PT      HH agency  [facility]   Status of service:  Completed, signed off Medicare Important Message given?   (If response is "NO", the following Medicare IM given date fields will be blank) Date Medicare IM given:   Date Additional Medicare IM given:    Discharge Disposition:  HOME W HOME HEALTH SERVICES  Per UR Regulation:    If discussed at Long Length of Stay Meetings, dates discussed:    Comments:

## 2012-07-28 NOTE — Progress Notes (Signed)
Physical Therapy Treatment Patient Details Name: Dennis Ayers MRN: [id number] DOB: 1950-04-20 Today's Date: 07/28/2012 Time:  1308- 1347  Total time: 39 min  PT Assessment / Plan / Recommendation Comments on Treatment Session  Pt reports improved pain management however feels it continues to be worse than yesterday. Session focused on stair training for pt and family. Son present for education. Pt able to perform steps with 2 persons for safety (one to stabilize walker, one to stabilize pt) however only needed min-guard/min assist with ascent and descent! Pt and family now feel comfortable with stairs and all education provided, feel safe to return home once medically ready.     Follow Up Recommendations  Home health PT;Supervision/Assistance - 24 hour           Equipment Recommendations  Rolling walker with 5" wheels       Frequency 7X/week   Plan Discharge plan remains appropriate    Precautions / Restrictions   WBAT Lt. LE  Pertinent Vitals/Pain 5/10 Lt. Knee with activity, premedicated    Mobility  Bed Mobility Bed Mobility: Supine to Sit;Sit to Supine Supine to Sit: 4: Min assist Sitting - Scoot to Edge of Bed: 5: Supervision Details for Bed Mobility Assistance: Assist needed for advancing LLE out of bed, min cues for sequencing and assistance. Pt guarded and moves slowly.  Transfers Transfers: Sit to Stand;Stand to Sit Sit to Stand: With upper extremity assist;From bed;4: Min assist Stand to Sit: With upper extremity assist;With armrests;To chair/3-in-1;4: Min guard Details for Transfer Assistance: Sit > stand from bed at lowest level, cues when stand>sit to slide Lt. LE infront for pain modulation. Improved control of descent as compared to morning. Ambulation/Gait Ambulation/Gait Assistance: 4: Min guard Ambulation Distance (Feet): 15 Feet Assistive device: Rolling walker Ambulation/Gait Assistance Details: Decreased ability to bear weight through Lt. LE Gait  Pattern: Step-to pattern;Decreased stance time - left;Decreased weight shift to left (min Lt. foot clearance) Gait velocity: slow Stairs: Yes Stairs Assistance: 4: Min assist;Other (comment) (two persons for safety) Stairs Assistance Details (indicate cue type and reason): PT demonstration of task then pt performing with son providing stability of walker, PT behind for stability. Once home family decided that wife would brace walker with hip while son provides stability of pt. Son practiced providing assistance with PT doing steps. Cues for sequencing and overall safety.  Instructed to have a wheelchair at bottom of steps and chair with armrests at top of steps to allow pt rest breaks prior to and after steps. Stair Management Technique: No rails;Backwards;With walker Wheelchair Mobility Wheelchair Mobility: No      PT Goals Acute Rehab PT Goals Pt will go Supine/Side to Sit: with modified independence;with HOB not 0 degrees (comment degree) PT Goal: Supine/Side to Sit - Progress: Progressing toward goal Pt will go Sit to Supine/Side: with modified independence;with HOB not 0 degrees (comment degree) PT Goal: Sit to Supine/Side - Progress: Progressing toward goal Pt will go Sit to Stand: with modified independence;with upper extremity assist PT Goal: Sit to Stand - Progress: Progressing toward goal Pt will go Stand to Sit: with modified independence;with upper extremity assist PT Goal: Stand to Sit - Progress: Progressing toward goal Pt will Ambulate: with least restrictive assistive device;with modified independence;51 - 150 feet PT Goal: Ambulate - Progress: Progressing toward goal Pt will Go Up / Down Stairs: 1-2 stairs;with modified independence;with least restrictive assistive device PT Goal: Up/Down Stairs - Progress: Progressing toward goal     Subjective Data  Subjective: I hope I can do the steps Patient Stated Goal: Return home         End of Session PT - End of  Session Equipment Utilized During Treatment: Gait belt Activity Tolerance: Patient tolerated treatment well Patient left: in chair;with call bell/phone within reach;with family/visitor present   GP     Wilhemina Bonito 07/28/2012, 7:12 AM

## 2012-07-28 NOTE — Progress Notes (Signed)
Physical Therapy Treatment Patient Details Name: Dennis Ayers MRN: [id number] DOB: 26-Nov-1950 Today's Date: 07/28/2012 Time: 4098-1191 PT Time Calculation (min): 45 min  PT Assessment / Plan / Recommendation Comments on Treatment Session  Pt mobilizing with improved efficiency and pain tolerance. Pt and wife feel comfortable with home D/C, report no need to practice stairs again. Pt given a few more exercises to HEP.     Follow Up Recommendations  Home health PT;Supervision/Assistance - 24 hour           Equipment Recommendations  Rolling walker with 5" wheels       Frequency 7X/week   Plan Discharge plan remains appropriate    Precautions / Restrictions Precautions Precautions: Knee Precaution Comments: Pt with good understanding of weightbearing and exercises Restrictions Weight Bearing Restrictions: No LLE Weight Bearing: Weight bearing as tolerated   Pertinent Vitals/Pain 5/10 Lt knee, premedicated     Mobility  Bed Mobility Bed Mobility: Supine to Sit;Sit to Supine Supine to Sit: 4: Min assist Sitting - Scoot to Edge of Bed: 5: Supervision Details for Bed Mobility Assistance: Assist needed for advancing LLE out of bed still despite increased time, min cues for sequencing and assistance. Pt guarded and moves slowly.  Transfers Transfers: Sit to Stand;Stand to Sit Sit to Stand: With upper extremity assist;From bed;4: Min guard Stand to Sit: With upper extremity assist;With armrests;To chair/3-in-1;4: Min guard Details for Transfer Assistance: Sit > stand from bed at lowest level, cues when stand>sit to slide Lt. LE infront for pain modulation. Ambulation/Gait Ambulation/Gait Assistance: 4: Min guard Ambulation Distance (Feet): 40 Feet Assistive device: Rolling walker Ambulation/Gait Assistance Details: Slow gait, decreased weight shift to Lt. Decreased Lt. knee/hip flexion resulting in decreased Lt. foot clearance. Cues for quad set during Lt. stance.  Gait  Pattern: Step-to pattern;Decreased stance time - left;Decreased weight shift to left (min Lt. foot clearance) Gait velocity: slow Stairs: No Wheelchair Mobility Wheelchair Mobility: No    Exercises Total Joint Exercises Ankle Circles/Pumps: AROM;Both;Other reps (comment) (30 reps) Quad Sets: AROM;Left;10 reps;Strengthening (with towel roll under ankle.) Heel Slides: AAROM;Left;10 reps;Seated;Supine Hip ABduction/ADduction: AAROM;Left;10 reps Straight Leg Raises: AAROM;Left;10 reps (with towel assist from pt) Long Arc Quad: AROM;5 reps;Left Knee Flexion: PROM;Seated;Left (3 x 2 min holds)     PT Goals Acute Rehab PT Goals Pt will go Supine/Side to Sit: with modified independence;with HOB not 0 degrees (comment degree) PT Goal: Supine/Side to Sit - Progress: Progressing toward goal Pt will go Sit to Supine/Side: with modified independence;with HOB not 0 degrees (comment degree) PT Goal: Sit to Supine/Side - Progress: Progressing toward goal Pt will go Sit to Stand: with modified independence;with upper extremity assist PT Goal: Sit to Stand - Progress: Progressing toward goal Pt will go Stand to Sit: with modified independence;with upper extremity assist PT Goal: Stand to Sit - Progress: Progressing toward goal Pt will Ambulate: with least restrictive assistive device;with modified independence;51 - 150 feet PT Goal: Ambulate - Progress: Progressing toward goal Pt will Perform Home Exercise Program: Independently PT Goal: Perform Home Exercise Program - Progress: Progressing toward goal  Visit Information  Last PT Received On: 07/28/12 Assistance Needed: +1    Subjective Data  Subjective: I want to do exercises, I feel comfortable with the steps Patient Stated Goal: Go home   Cognition  Cognition Arousal/Alertness: Awake/alert Behavior During Therapy: WFL for tasks assessed/performed Overall Cognitive Status: Within Functional Limits for tasks assessed       End of  Session PT -  End of Session Equipment Utilized During Treatment: Gait belt Activity Tolerance: Patient tolerated treatment well Patient left: in chair;with call bell/phone within reach;with family/visitor present   GP     Wilhemina Bonito 07/28/2012, 12:54 PM

## 2012-07-28 NOTE — Progress Notes (Signed)
Patient ID: Dennis Ayers, male   DOB: 05/25/1950, 62 y.o.   MRN: [id number]     Subjective:  Patient reports pain as mild.  patient alert and using CPM states that he is doing better every day  Objective:   VITALS:   Filed Vitals:   07/27/12 0458 07/27/12 0550 07/27/12 2103 07/28/12 0545  BP:  140/72 139/64 125/66  Pulse:  98 96 92  Temp: 99.3 F (37.4 C) 99 F (37.2 C) 99.3 F (37.4 C) 98.3 F (36.8 C)  TempSrc:      Resp:  18 20 18   Height:      Weight:      SpO2:  94% 93%     ABD soft Sensation intact distally Dorsiflexion/Plantar flexion intact Incision: dressing C/D/I and no drainage   Lab Results  Component Value Date   WBC 7.0 07/28/2012   HGB 13.0 07/28/2012   HCT 37.6* 07/28/2012   MCV 91.5 07/28/2012   PLT 186 07/28/2012     Assessment/Plan: 3 Days Post-Op   Principal Problem:   Osteoarthritis of left knee Active Problems:   Severe obesity (BMI >= 40)   Advance diet Up with therapy Discharge home with home health Work with PT this AM   Dennis Ayers, Dennis Ayers 07/28/2012, 7:16 AM   Teryl Lucy, MD Cell [phone]

## 2012-07-28 NOTE — Discharge Summary (Signed)
Physician Discharge Summary  Patient ID: Dennis Ayers MRN: [id number] DOB/AGE: Dennis Ayers 62 y.o.  Admit date: 07/25/2012 Discharge date: 07/28/2012  Admission Diagnoses:  Osteoarthritis of left knee  Discharge Diagnoses:  Principal Problem:   Osteoarthritis of left knee Active Problems:   Severe obesity (BMI >= 40)   Past Medical History  Diagnosis Date  . Hyperglycemia   . Benign neoplasm of skin, site unspecified   . Morbid obesity   . Knee pain   . Jaundice     when very young, treated and resolved  . Hepatitis     has had jaundice in past, unsure of cause  . DJD (degenerative joint disease) of knee   . OSA (obstructive sleep apnea)     mild to mod, working on weight loss  . Osteoarthritis of left knee 07/23/2011  . Severe obesity (BMI >= 40) 02/15/2008    Qualifier: Diagnosis of  By: Hetty Ely MD, Franne Grip     Surgeries: Procedure(s): TOTAL KNEE ARTHROPLASTY on 07/25/2012   Consultants (if any):    Discharged Condition: Improved  Hospital Course: Dennis Ayers is an 62 y.o. male who was admitted 07/25/2012 with a diagnosis of Osteoarthritis of left knee and went to the operating room on 07/25/2012 and underwent the above named procedures.    He was given perioperative antibiotics:  Anti-infectives   Start     Dose/Rate Route Frequency Ordered Stop   07/25/12 1500  ceFAZolin (ANCEF) 3 g in dextrose 5 % 50 mL IVPB     3 g 160 mL/hr over 30 Minutes Intravenous Every 6 hours 07/25/12 1459 07/25/12 2313   07/25/12 0600  ceFAZolin (ANCEF) 3 g in dextrose 5 % 50 mL IVPB     3 g 160 mL/hr over 30 Minutes Intravenous On call to O.R. 07/24/12 1351 07/25/12 0740    .  He was given sequential compression devices, early ambulation, and lovenox for DVT prophylaxis.  He benefited maximally from the hospital stay and there were no complications.    Recent vital signs:  Filed Vitals:   07/28/12 0545  BP: 125/66  Pulse: 92  Temp: 98.3 F (36.8 C)  Resp: 18     Recent laboratory studies:  Lab Results  Component Value Date   HGB 13.0 07/28/2012   HGB 13.5 07/27/2012   HGB 14.5 07/26/2012   Lab Results  Component Value Date   WBC 7.0 07/28/2012   PLT 186 07/28/2012   Lab Results  Component Value Date   INR 1.04 07/19/2012   Lab Results  Component Value Date   NA 134* 07/26/2012   K 4.5 07/26/2012   CL 100 07/26/2012   CO2 28 07/26/2012   BUN 19 07/26/2012   CREATININE 0.94 07/26/2012   GLUCOSE 164* 07/26/2012    Discharge Medications:     Medication List    TAKE these medications       ibuprofen 200 MG tablet  Commonly known as:  ADVIL,MOTRIN  Take 200 mg by mouth every 6 (six) hours as needed for pain.     Krill Oil 300 MG Caps  Take 300 mg by mouth daily.     methocarbamol 500 MG tablet  Commonly known as:  ROBAXIN  Take 1 tablet (500 mg total) by mouth 4 (four) times daily.     multivitamin tablet  Take 1 tablet by mouth daily.     OSTEO BI-FLEX JOINT SHIELD PO  Take 2 capsules by mouth daily.  oxyCODONE-acetaminophen 10-325 MG per tablet  Commonly known as:  PERCOCET  Take 1-2 tablets by mouth every 6 (six) hours as needed for pain. MAXIMUM TOTAL ACETAMINOPHEN DOSE IS 4000 MG PER DAY     promethazine 25 MG tablet  Commonly known as:  PHENERGAN  Take 1 tablet (25 mg total) by mouth every 6 (six) hours as needed for nausea.        Diagnostic Studies: Dg Chest 2 View  07/05/2012   *RADIOLOGY REPORT*  Clinical Data: Preop radiograph.  Osteoarthritis.  CHEST - 2 VIEW  Comparison: None  Findings: There is mild cardiac enlargement.  There is no pleural effusion or edema.  No airspace consolidation identified.  Review of the visualized osseous structures is unremarkable.  IMPRESSION:  1.  No acute cardiopulmonary abnormalities.   Original Report Authenticated By: Signa Kell, M.D.   Dg Knee Left Port  07/25/2012   *RADIOLOGY REPORT*  Clinical Data: Ayers left total knee replacement  PORTABLE LEFT KNEE - 1-2 VIEW  Comparison:  Preoperative left knee films of 11/19/2011  Findings: The femoral and tibial components of the left total hip lower replacement are in good position.  No complicating features are seen.  There is air in the soft tissues and joint space postoperatively.  IMPRESSION: The left total knee replacement components are in good position. No complicating features.   Original Report Authenticated By: Dwyane Dee, M.D.    Disposition: 01-Home or Self Care      Discharge Orders   Future Orders Complete By Expires     Call MD / Call 911  As directed     Comments:      If you experience chest pain or shortness of breath, CALL 911 and be transported to the hospital emergency room.  If you develope a fever above 101 F, pus (white drainage) or increased drainage or redness at the wound, or calf pain, call your surgeon's office.    Change dressing  As directed     Comments:      Change dressing in three days, then change the dressing daily with sterile 4 x 4 inch gauze dressing.  You may clean the incision with alcohol prior to redressing.    Constipation Prevention  As directed     Comments:      Drink plenty of fluids.  Prune juice may be helpful.  You may use a stool softener, such as Colace (over the counter) 100 mg twice a day.  Use MiraLax (over the counter) for constipation as needed.    Diet general  As directed     Discharge instructions  As directed     Comments:      Change dressing in 3 days and reapply fresh dressing, unless you have a splint (half cast).  If you have a splint/cast, just leave in place until your follow-up appointment.    Keep wounds dry for 3 weeks.  Leave steri-strips in place on skin.  Do not apply lotion or anything to the wound.    Do not put a pillow under the knee. Place it under the heel.  As directed     TED hose  As directed     Comments:      Use stockings (TED hose) for 2 weeks on both leg(s).  You may remove them at night for sleeping.       Follow-up Information    Follow up with Dennis Post, MD. Schedule an appointment as soon as possible for  a visit in 2 weeks.   Contact information:   [address] [place] [address] [phone]        Signed: Eulas Ayers 07/28/2012, 7:47 AM

## 2012-09-11 ENCOUNTER — Encounter: Payer: Self-pay | Admitting: Family Medicine

## 2012-12-28 ENCOUNTER — Other Ambulatory Visit: Payer: Self-pay

## 2013-07-30 ENCOUNTER — Encounter: Payer: Self-pay | Admitting: Family Medicine

## 2013-10-29 ENCOUNTER — Encounter: Payer: Self-pay | Admitting: Family Medicine

## 2014-01-29 ENCOUNTER — Encounter: Payer: Self-pay | Admitting: Family Medicine

## 2014-05-10 ENCOUNTER — Encounter: Payer: Self-pay | Admitting: Family Medicine

## 2014-10-29 ENCOUNTER — Encounter: Payer: Self-pay | Admitting: Internal Medicine

## 2015-06-27 ENCOUNTER — Other Ambulatory Visit: Payer: Self-pay | Admitting: Family Medicine

## 2015-06-27 ENCOUNTER — Other Ambulatory Visit (INDEPENDENT_AMBULATORY_CARE_PROVIDER_SITE_OTHER): Payer: PRIVATE HEALTH INSURANCE

## 2015-06-27 ENCOUNTER — Ambulatory Visit: Payer: Self-pay

## 2015-06-27 DIAGNOSIS — Z7289 Other problems related to lifestyle: Secondary | ICD-10-CM | POA: Diagnosis not present

## 2015-06-27 DIAGNOSIS — R7309 Other abnormal glucose: Secondary | ICD-10-CM

## 2015-06-27 DIAGNOSIS — Z1159 Encounter for screening for other viral diseases: Secondary | ICD-10-CM | POA: Diagnosis not present

## 2015-06-27 DIAGNOSIS — Z125 Encounter for screening for malignant neoplasm of prostate: Secondary | ICD-10-CM

## 2015-06-27 LAB — BASIC METABOLIC PANEL
BUN: 18 mg/dL (ref 6–23)
CALCIUM: 9.2 mg/dL (ref 8.4–10.5)
CO2: 27 meq/L (ref 19–32)
CREATININE: 0.77 mg/dL (ref 0.40–1.50)
Chloride: 104 mEq/L (ref 96–112)
GFR: 107.55 mL/min (ref >60.00)
GLUCOSE: 189 mg/dL — AB (ref 70–99)
Potassium: 4 mEq/L (ref 3.5–5.1)
SODIUM: 140 meq/L (ref 135–145)

## 2015-06-27 LAB — LIPID PANEL
Cholesterol: 150 mg/dL (ref 0–200)
HDL: 45.8 mg/dL (ref >39.00)
LDL CALC: 84 mg/dL (ref 0–99)
NONHDL: 103.75
Total CHOL/HDL Ratio: 3
Triglycerides: 99 mg/dL (ref 0.0–149.0)
VLDL: 19.8 mg/dL (ref 0.0–40.0)

## 2015-06-27 LAB — PSA: PSA: 1.92 ng/mL (ref 0.10–4.00)

## 2015-06-28 LAB — HEPATITIS C ANTIBODY: HCV AB: NEGATIVE

## 2015-07-10 ENCOUNTER — Encounter: Payer: Self-pay | Admitting: Family Medicine

## 2015-07-10 ENCOUNTER — Ambulatory Visit (INDEPENDENT_AMBULATORY_CARE_PROVIDER_SITE_OTHER): Payer: Medicare Other | Admitting: Family Medicine

## 2015-07-10 VITALS — BP 136/82 | HR 84 | Temp 97.4°F | Ht 73.5 in | Wt 325.5 lb

## 2015-07-10 DIAGNOSIS — Z0000 Encounter for general adult medical examination without abnormal findings: Secondary | ICD-10-CM | POA: Diagnosis not present

## 2015-07-10 DIAGNOSIS — M170 Bilateral primary osteoarthritis of knee: Secondary | ICD-10-CM

## 2015-07-10 DIAGNOSIS — Z23 Encounter for immunization: Secondary | ICD-10-CM | POA: Diagnosis not present

## 2015-07-10 DIAGNOSIS — R7309 Other abnormal glucose: Secondary | ICD-10-CM

## 2015-07-10 DIAGNOSIS — G4733 Obstructive sleep apnea (adult) (pediatric): Secondary | ICD-10-CM

## 2015-07-10 DIAGNOSIS — Z86010 Personal history of colonic polyps: Secondary | ICD-10-CM | POA: Diagnosis not present

## 2015-07-10 DIAGNOSIS — N529 Male erectile dysfunction, unspecified: Secondary | ICD-10-CM | POA: Insufficient documentation

## 2015-07-10 MED ORDER — SILDENAFIL CITRATE 100 MG PO TABS: 50.0000 mg | ORAL_TABLET | Freq: Every day | ORAL | Status: DC | PRN

## 2015-07-10 NOTE — Assessment & Plan Note (Signed)
I have personally reviewed the Medicare Annual Wellness questionnaire and have noted 1. The patient's medical and social history 2. Their use of alcohol, tobacco or illicit drugs 3. Their current medications and supplements 4. The patient's functional ability including ADL's, fall risks, home safety risks and hearing or visual impairment. Cognitive function has been assessed and addressed as indicated.  5. Diet and physical activity 6. Evidence for depression or mood disorders The patients weight, height, BMI have been recorded in the chart. I have made referrals, counseling and provided education to the patient based on review of the above and I have provided the pt with a written personalized care plan for preventive services. Provider list updated.. See scanned questionairre as needed for further documentation. Reviewed preventative protocols and updated unless pt declined.   EKG NSR rate 75, mild LAD, normal intervals, no acute ST/T changes, Q V1 with poor R wave progression, unchanged from prior 2014

## 2015-07-10 NOTE — Assessment & Plan Note (Signed)
Mild on last eval. Anticipate improvement with weight loss. Pt not interested in CPAP at this time.

## 2015-07-10 NOTE — Assessment & Plan Note (Signed)
Requests referral to GI

## 2015-07-10 NOTE — Progress Notes (Signed)
BP 136/82 mmHg  Pulse 84  Temp(Src) 97.4 F (36.3 C) (Oral)  Ht 6' 1.5" (1.867 m)  Wt 325 lb 8 oz (147.646 kg)  BMI 42.36 kg/m2   CC: welcome to medicare visit  Subjective:    Patient ID: Dennis Ayers, male    DOB: 03/07/1950, 65 y.o.   MRN: [id number]  HPI: Dennis Ayers is a 65 y.o. male presenting on 07/10/2015 for Annual Exam   Last seen here 07/05/2012 prior to his L TKR by Dr Mardelle Matte.   Retired 04/2015. Weight down 30lbs over last 3 yrs! Working on low carb diet. Works in yard - activity somewhat limited by R knee pain. Trying to hold off on R knee replacement.   H/o OSA - never on CPAP. Takes unisom for sleep. Sleeps well first 4-5 hours then wakes up and has trouble falling back asleep. Takes nap daily. No noted snoring or apneic episodes.   Some ED trouble maintaining - requests viagra. Denies chest pain, dyspnea or palipitations.   Hearing screen - passed Vision screen - with eye doctor  Fall risk screen - passed Depression screen - passed  Preventative: COLONOSCOPY Date: 08/20/2011 4 adenomatous polyps - rpt 3 yrs Gatha Mayer)  Prostate cancer screening - discussed. Will screen.  Lung cancer screening - never smoker AAA screen - never smoker, no fmhx AAA Flu shot - yearly Td 2004, Tdap today prevnar - today Shingles shot - 2013 Advanced directive discussion - has at home. HCPOA is wife and son. Asked to bring [place] a copy. Seat belt use discussed  Sunscreen use and skin screen discussed, no changing moles on skin.  Caffeine: 2-3 diet sodas/day Lives with wife 1 grown son. Occupation: Self-employed; Psychologist, forensic Activity: some golf, limited by knee pain Diet: good water, daily vegetables  Relevant past medical, surgical, family and social history reviewed and updated as indicated. Interim medical history since our last visit reviewed. Allergies and medications reviewed and updated. Current Outpatient Prescriptions on File  Prior to Visit  Medication Sig  . ibuprofen (ADVIL,MOTRIN) 200 MG tablet Take 200 mg by mouth every 6 (six) hours as needed for pain.  Maylea Soria Docker Oil 300 MG CAPS Take 300 mg by mouth daily.  . Misc Natural Products (OSTEO BI-FLEX JOINT SHIELD PO) Take 2 capsules by mouth daily.  . Multiple Vitamin (MULTIVITAMIN) tablet Take 1 tablet by mouth daily.   No current facility-administered medications on file prior to visit.    Review of Systems Per HPI unless specifically indicated in ROS section     Objective:    BP 136/82 mmHg  Pulse 84  Temp(Src) 97.4 F (36.3 C) (Oral)  Ht 6' 1.5" (1.867 m)  Wt 325 lb 8 oz (147.646 kg)  BMI 42.36 kg/m2  Wt Readings from Last 3 Encounters:  07/10/15 325 lb 8 oz (147.646 kg)  07/25/12 354 lb (160.573 kg)  07/19/12 354 lb 9.6 oz (160.846 kg)    Physical Exam  Constitutional: He is oriented to person, place, and time. He appears well-developed and well-nourished. No distress.  HENT:  Head: Normocephalic and atraumatic.  Right Ear: Hearing, tympanic membrane, external ear and ear canal normal.  Left Ear: Hearing, tympanic membrane, external ear and ear canal normal.  Nose: Nose normal.  Mouth/Throat: Uvula is midline, oropharynx is clear and moist and mucous membranes are normal. No oropharyngeal exudate, posterior oropharyngeal edema or posterior oropharyngeal erythema.  Eyes: Conjunctivae and EOM are normal. Pupils are equal,  round, and reactive to light. No scleral icterus.  Neck: Normal range of motion. Neck supple. Carotid bruit is not present. No thyromegaly present.  Cardiovascular: Normal rate, regular rhythm, normal heart sounds and intact distal pulses.   No murmur heard. Pulses:      Radial pulses are 2+ on the right side, and 2+ on the left side.  Pulmonary/Chest: Effort normal and breath sounds normal. No respiratory distress. He has no wheezes. He has no rales.  Abdominal: Soft. Bowel sounds are normal. He exhibits no distension and no  mass. There is no tenderness. There is no rebound and no guarding.  Genitourinary: Rectum normal and prostate normal. Rectal exam shows no external hemorrhoid, no internal hemorrhoid, no fissure, no mass, no tenderness and anal tone normal. Prostate is not enlarged (20gm) and not tender.  Musculoskeletal: Normal range of motion. He exhibits no edema.  Lymphadenopathy:    He has no cervical adenopathy.  Neurological: He is alert and oriented to person, place, and time.  CN grossly intact, station and gait intact Recall 3/3  Calculation 5/5 serial 3s  Skin: Skin is warm and dry. No rash noted.  Psychiatric: He has a normal mood and affect. His behavior is normal. Judgment and thought content normal.  Nursing note and vitals reviewed.  Results for orders placed or performed in visit on 06/27/15  Lipid panel  Result Value Ref Range   Cholesterol 150 0 - 200 mg/dL   Triglycerides 99.0 0.0 - 149.0 mg/dL   HDL 45.80 >39.00 mg/dL   VLDL 19.8 0.0 - 40.0 mg/dL   LDL Cholesterol 84 0 - 99 mg/dL   Total CHOL/HDL Ratio 3    NonHDL 99991111   Basic metabolic panel  Result Value Ref Range   Sodium 140 135 - 145 mEq/L   Potassium 4.0 3.5 - 5.1 mEq/L   Chloride 104 96 - 112 mEq/L   CO2 27 19 - 32 mEq/L   Glucose, Bld 189 (H) 70 - 99 mg/dL   BUN 18 6 - 23 mg/dL   Creatinine, Ser 0.77 0.40 - 1.50 mg/dL   Calcium 9.2 8.4 - 10.5 mg/dL   GFR 107.55 >60.00 mL/min  PSA  Result Value Ref Range   PSA 1.92 0.10 - 4.00 ng/mL      Assessment & Plan:   Problem List Items Addressed This Visit    Severe obesity (BMI >= 40) (HCC) (Chronic)    Congratulated on weight loss to date.  Motivated to continue healthy diet changes.      HYPERGLYCEMIA    Again elevated - return in 3 mo for A1c and OV. Discussed concern for diabetes. Recommend continued low carb diet.       Knee osteoarthritis    S/p L knee replacement.      Personal history of colonic polyps-serrated adenomas and tubular adenoma     Requests referral to GI      Relevant Orders   Ambulatory referral to Gastroenterology   OSA (obstructive sleep apnea)    Mild on last eval. Anticipate improvement with weight loss. Pt not interested in CPAP at this time.      Welcome to Medicare preventive visit - Primary    I have personally reviewed the Medicare Annual Wellness questionnaire and have noted 1. The patient's medical and social history 2. Their use of alcohol, tobacco or illicit drugs 3. Their current medications and supplements 4. The patient's functional ability including ADL's, fall risks, home safety risks and hearing or  visual impairment. Cognitive function has been assessed and addressed as indicated.  5. Diet and physical activity 6. Evidence for depression or mood disorders The patients weight, height, BMI have been recorded in the chart. I have made referrals, counseling and provided education to the patient based on review of the above and I have provided the pt with a written personalized care plan for preventive services. Provider list updated.. See scanned questionairre as needed for further documentation. Reviewed preventative protocols and updated unless pt declined.   EKG NSR rate 75, mild LAD, normal intervals, no acute ST/T changes, Q V1 with poor R wave progression, unchanged from prior 2014      Relevant Orders   EKG 12-Lead (Completed)   Erectile dysfunction    Discussed etiology as well as treatment.  Pt requests trial viagra - discussed possible HA, monitoring for priapism, and need to avoid nitrates. Coupon provided.          Follow up plan: Return in about 3 months (around 10/10/2015), or as needed, for follow up visit.  Ria Bush, MD

## 2015-07-10 NOTE — Assessment & Plan Note (Signed)
S/p L knee replacement.

## 2015-07-10 NOTE — Assessment & Plan Note (Signed)
Discussed etiology as well as treatment.  Pt requests trial viagra - discussed possible HA, monitoring for priapism, and need to avoid nitrates. Coupon provided.

## 2015-07-10 NOTE — Assessment & Plan Note (Signed)
Again elevated - return in 3 mo for A1c and OV. Discussed concern for diabetes. Recommend continued low carb diet.

## 2015-07-10 NOTE — Patient Instructions (Addendum)
Welcome to medicare visit today. EKG performed today. Tdap and prevnar today. Bring [place] copy of living will.  Expect call to schedule colonoscopy.  Trial viagra - coupon provided today. You are doing well. Return as needed or in 3 months for follow up visit to recheck sugar.   Health Maintenance, Male A healthy lifestyle and preventative care can promote health and wellness.  Maintain regular health, dental, and eye exams.  Eat a healthy diet. Foods like vegetables, fruits, whole grains, low-fat dairy products, and lean protein foods contain the nutrients you need and are low in calories. Decrease your intake of foods high in solid fats, added sugars, and salt. Get information about a proper diet from your health care provider, if necessary.  Regular physical exercise is one of the most important things you can do for your health. Most adults should get at least 150 minutes of moderate-intensity exercise (any activity that increases your heart rate and causes you to sweat) each week. In addition, most adults need muscle-strengthening exercises on 2 or more days a week.   Maintain a healthy weight. The body mass index (BMI) is a screening tool to identify possible weight problems. It provides an estimate of body fat based on height and weight. Your health care provider can find your BMI and can help you achieve or maintain a healthy weight. For males 20 years and older:  A BMI below 18.5 is considered underweight.  A BMI of 18.5 to 24.9 is normal.  A BMI of 25 to 29.9 is considered overweight.  A BMI of 30 and above is considered obese.  Maintain normal blood lipids and cholesterol by exercising and minimizing your intake of saturated fat. Eat a balanced diet with plenty of fruits and vegetables. Blood tests for lipids and cholesterol should begin at age 23 and be repeated every 5 years. If your lipid or cholesterol levels are high, you are over age 64, or you are at high risk for heart  disease, you may need your cholesterol levels checked more frequently.Ongoing high lipid and cholesterol levels should be treated with medicines if diet and exercise are not working.  If you smoke, find out from your health care provider how to quit. If you do not use tobacco, do not start.  Lung cancer screening is recommended for adults aged 19-80 years who are at high risk for developing lung cancer because of a history of smoking. A yearly low-dose CT scan of the lungs is recommended for people who have at least a 30-pack-year history of smoking and are current smokers or have quit within the past 15 years. A pack year of smoking is smoking an average of 1 pack of cigarettes a day for 1 year (for example, a 30-pack-year history of smoking could mean smoking 1 pack a day for 30 years or 2 packs a day for 15 years). Yearly screening should continue until the smoker has stopped smoking for at least 15 years. Yearly screening should be stopped for people who develop a health problem that would prevent them from having lung cancer treatment.  If you choose to drink alcohol, do not have more than 2 drinks per day. One drink is considered to be 12 oz (360 mL) of beer, 5 oz (150 mL) of wine, or 1.5 oz (45 mL) of liquor.  Avoid the use of street drugs. Do not share needles with anyone. Ask for help if you need support or instructions about stopping the use of drugs.  High blood pressure causes heart disease and increases the risk of stroke. High blood pressure is more likely to develop in:  People who have blood pressure in the end of the normal range (100-139/85-89 mm Hg).  People who are overweight or obese.  People who are African American.  If you are 31-71 years of age, have your blood pressure checked every 3-5 years. If you are 35 years of age or older, have your blood pressure checked every year. You should have your blood pressure measured twice--once when you are at a hospital or clinic, and  once when you are not at a hospital or clinic. Record the average of the two measurements. To check your blood pressure when you are not at a hospital or clinic, you can use:  An automated blood pressure machine at a pharmacy.  A home blood pressure monitor.  If you are 36-48 years old, ask your health care provider if you should take aspirin to prevent heart disease.  Diabetes screening involves taking a blood sample to check your fasting blood sugar level. This should be done once every 3 years after age 58 if you are at a normal weight and without risk factors for diabetes. Testing should be considered at a younger age or be carried out more frequently if you are overweight and have at least 1 risk factor for diabetes.  Colorectal cancer can be detected and often prevented. Most routine colorectal cancer screening begins at the age of 49 and continues through age 46. However, your health care provider may recommend screening at an earlier age if you have risk factors for colon cancer. On a yearly basis, your health care provider may provide home test kits to check for hidden blood in the stool. A small camera at the end of a tube may be used to directly examine the colon (sigmoidoscopy or colonoscopy) to detect the earliest forms of colorectal cancer. Talk to your health care provider about this at age 71 when routine screening begins. A direct exam of the colon should be repeated every 5-10 years through age 49, unless early forms of precancerous polyps or small growths are found.  People who are at an increased risk for hepatitis B should be screened for this virus. You are considered at high risk for hepatitis B if:  You were born in a country where hepatitis B occurs often. Talk with your health care provider about which countries are considered high risk.  Your parents were born in a high-risk country and you have not received a shot to protect against hepatitis B (hepatitis B  vaccine).  You have HIV or AIDS.  You use needles to inject street drugs.  You live with, or have sex with, someone who has hepatitis B.  You are a man who has sex with other men (MSM).  You get hemodialysis treatment.  You take certain medicines for conditions like cancer, organ transplantation, and autoimmune conditions.  Hepatitis C blood testing is recommended for all people born from [birth year] through 1965 and any individual with known risk factors for hepatitis C.  Healthy men should no longer receive prostate-specific antigen (PSA) blood tests as part of routine cancer screening. Talk to your health care provider about prostate cancer screening.  Testicular cancer screening is not recommended for adolescents or adult males who have no symptoms. Screening includes self-exam, a health care provider exam, and other screening tests. Consult with your health care provider about any symptoms you have or any  concerns you have about testicular cancer.  Practice safe sex. Use condoms and avoid high-risk sexual practices to reduce the spread of sexually transmitted infections (STIs).  You should be screened for STIs, including gonorrhea and chlamydia if:  You are sexually active and are younger than 24 years.  You are older than 24 years, and your health care provider tells you that you are at risk for this type of infection.  Your sexual activity has changed since you were last screened, and you are at an increased risk for chlamydia or gonorrhea. Ask your health care provider if you are at risk.  If you are at risk of being infected with HIV, it is recommended that you take a prescription medicine daily to prevent HIV infection. This is called pre-exposure prophylaxis (PrEP). You are considered at risk if:  You are a man who has sex with other men (MSM).  You are a heterosexual man who is sexually active with multiple partners.  You take drugs by injection.  You are sexually active  with a partner who has HIV.  Talk with your health care provider about whether you are at high risk of being infected with HIV. If you choose to begin PrEP, you should first be tested for HIV. You should then be tested every 3 months for as long as you are taking PrEP.  Use sunscreen. Apply sunscreen liberally and repeatedly throughout the day. You should seek shade when your shadow is shorter than you. Protect yourself by wearing long sleeves, pants, a wide-brimmed hat, and sunglasses year round whenever you are outdoors.  Tell your health care provider of new moles or changes in moles, especially if there is a change in shape or color. Also, tell your health care provider if a mole is larger than the size of a pencil eraser.  A one-time screening for abdominal aortic aneurysm (AAA) and surgical repair of large AAAs by ultrasound is recommended for men aged 18-75 years who are current or former smokers.  Stay current with your vaccines (immunizations).   This information is not intended to replace advice given to you by your health care provider. Make sure you discuss any questions you have with your health care provider.   Document Released: 08/07/2007 Document Revised: 03/01/2014 Document Reviewed: 07/06/2010 Elsevier Interactive Patient Education [employer].

## 2015-07-10 NOTE — Progress Notes (Signed)
Pre visit review using our clinic review tool, if applicable. No additional management support is needed unless otherwise documented below in the visit note. 

## 2015-07-10 NOTE — Assessment & Plan Note (Signed)
Congratulated on weight loss to date.  Motivated to continue healthy diet changes.

## 2015-07-10 NOTE — Addendum Note (Signed)
Addended by: Royann Shivers A on: 07/10/2015 10:19 AM   Modules accepted: Orders

## 2015-07-11 ENCOUNTER — Ambulatory Visit (AMBULATORY_SURGERY_CENTER): Payer: PRIVATE HEALTH INSURANCE

## 2015-07-11 VITALS — Ht 73.75 in | Wt 328.8 lb

## 2015-07-11 DIAGNOSIS — Z860100 Personal history of colon polyps, unspecified: Secondary | ICD-10-CM

## 2015-07-11 NOTE — Progress Notes (Signed)
No allergies to eggs or soy No past problems with anesthesia No diet meds No home oxygen  Has email and internet; declined emmi 

## 2015-07-22 ENCOUNTER — Ambulatory Visit (AMBULATORY_SURGERY_CENTER): Payer: Medicare Other | Admitting: Internal Medicine

## 2015-07-22 ENCOUNTER — Encounter: Payer: Self-pay | Admitting: Internal Medicine

## 2015-07-22 VITALS — BP 137/84 | HR 73 | Temp 98.6°F | Resp 16 | Ht 74.0 in | Wt 328.0 lb

## 2015-07-22 DIAGNOSIS — D123 Benign neoplasm of transverse colon: Secondary | ICD-10-CM | POA: Diagnosis not present

## 2015-07-22 DIAGNOSIS — Z86010 Personal history of colonic polyps: Secondary | ICD-10-CM

## 2015-07-22 DIAGNOSIS — D128 Benign neoplasm of rectum: Secondary | ICD-10-CM

## 2015-07-22 DIAGNOSIS — K621 Rectal polyp: Secondary | ICD-10-CM | POA: Diagnosis not present

## 2015-07-22 DIAGNOSIS — D122 Benign neoplasm of ascending colon: Secondary | ICD-10-CM

## 2015-07-22 DIAGNOSIS — D120 Benign neoplasm of cecum: Secondary | ICD-10-CM

## 2015-07-22 DIAGNOSIS — D129 Benign neoplasm of anus and anal canal: Secondary | ICD-10-CM

## 2015-07-22 DIAGNOSIS — Z1211 Encounter for screening for malignant neoplasm of colon: Secondary | ICD-10-CM | POA: Diagnosis not present

## 2015-07-22 HISTORY — PX: COLONOSCOPY: SHX174

## 2015-07-22 MED ORDER — SODIUM CHLORIDE 0.9 % IV SOLN
500.0000 mL | INTRAVENOUS | Status: DC
Start: 2015-07-22 — End: 2015-07-23

## 2015-07-22 NOTE — Progress Notes (Signed)
Called to room to assist during endoscopic procedure.  Patient ID and intended procedure confirmed with present staff. Received instructions for my participation in the procedure from the performing physician.  

## 2015-07-22 NOTE — Op Note (Signed)
[place] Patient Name: Dennis Ayers Procedure Date: 07/22/2015 12:49 PM MRN: [id number] Endoscopist: Gatha Mayer , MD Age: 65 Referring MD:  Date of Birth: 1950/04/23 Gender: Male Procedure:                Colonoscopy Indications:              Surveillance: Personal history of adenomatous                            polyps on last colonoscopy > 3 years ago Medicines:                Propofol per Anesthesia, Monitored Anesthesia Care Procedure:                Pre-Anesthesia Assessment:                           - Prior to the procedure, a History and Physical                            was performed, and patient medications and                            allergies were reviewed. The patient's tolerance of                            previous anesthesia was also reviewed. The risks                            and benefits of the procedure and the sedation                            options and risks were discussed with the patient.                            All questions were answered, and informed consent                            was obtained. Prior Anticoagulants: The patient                            last took ibuprofen 5 days prior to the procedure.                            ASA Grade Assessment: II - A patient with mild                            systemic disease. After reviewing the risks and                            benefits, the patient was deemed in satisfactory                            condition to undergo the procedure.  After obtaining informed consent, the colonoscope                            was passed under direct vision. Throughout the                            procedure, the patient's blood pressure, pulse, and                            oxygen saturations were monitored continuously. The                            Model CF-HQ190L [id number]) scope was introduced                            through the anus and advanced  to the the cecum,                            identified by appendiceal orifice and ileocecal                            valve. The colonoscopy was somewhat difficult due                            to significant looping. Successful completion of                            the procedure was aided by applying abdominal                            pressure. The patient tolerated the procedure well.                            The quality of the bowel preparation was good. The                            bowel preparation used was Miralax. The ileocecal                            valve, appendiceal orifice, and rectum were                            photographed. Scope In: 1:15:35 PM Scope Out: 1:54:01 PM Scope Withdrawal Time: 0 hours 29 minutes 44 seconds  Total Procedure Duration: 0 hours 38 minutes 26 seconds  Findings:                 The perianal and digital rectal examinations were                            normal. Pertinent negatives include normal prostate                            (size, shape, and consistency).  Many sessile polyps were found in the rectum,                            ascending colon and cecum. The polyps were 5 to 15                            mm in size. These polyps were removed with a hot                            and/or coldsnare. Resection and retrieval were                            complete. Verification of patient identification                            for the specimen was done. Estimated blood loss was                            minimal.                           A 25 mm polyp was found in the cecum. The polyp was                            carpet-like and flat. Polypectomy was not attempted                            due to size, respiratory movement of colon and                            difficulty maintaining position.ultly. It was not                            photographed.                           Many diverticula were  found in the sigmoid colon.                            There was narrowing of the colon in association                            with the diverticular opening.                           The exam was otherwise without abnormality on                            direct and retroflexion views. Complications:            No immediate complications. Estimated Blood Loss:     Estimated blood loss was minimal. Impression:               - Many 5 to 15 mm polyps in the rectum, in the  ascending colon and in the cecum, removed with a                            hot snare. Resected and retrieved.                           - One 25 mm polyp in the cecum. Resection not                            attempted.                           - Severe diverticulosis in the sigmoid colon. There                            was narrowing of the colon in association with the                            diverticular opening.                           - The examination was otherwise normal on direct                            and retroflexion views. Recommendation:           - Patient has a contact number available for                            emergencies. The signs and symptoms of potential                            delayed complications were discussed with the                            patient. Return to normal activities tomorrow.                            Written discharge instructions were provided to the                            patient.                           - Resume previous diet.                           - Continue present medications.                           - No aspirin, ibuprofen, naproxen, or other                            non-steroidal anti-inflammatory drugs for 2 weeks  after polyp removal.                           - Will contact hime about resection options of                            large cecal polyp - surgery vs colonoscopy under                             general anesthesia with APC available. ? if he has                            attenuated polyposis syndrome (or close to it)                           - Repeat colonoscopy is recommended. The                            colonoscopy date will be determined after pathology                            results from today's exam become available for                            review. Gatha Mayer, MD 07/22/2015 2:10:30 PM This report has been signed electronically.

## 2015-07-22 NOTE — Patient Instructions (Addendum)
I found and removed 12 polyps. I left a very large flat polyp in the colon - needs to be removed but need to discuss options. Surgery vs. Colonoscopy under general anesthesia.  I will call by next week to discuss.  I appreciate the opportunity to care for you. Gatha Mayer, MD, FACG    YOU HAD AN ENDOSCOPIC PROCEDURE TODAY AT [facility]:   Refer to the procedure report that was given to you for any specific questions about what was found during the examination.  If the procedure report does not answer your questions, please call your gastroenterologist to clarify.  If you requested that your care partner not be given the details of your procedure findings, then the procedure report has been included in a sealed envelope for you to review at your convenience later.  YOU SHOULD EXPECT: Some feelings of bloating in the abdomen. Passage of more gas than usual.  Walking can help get rid of the air that was put into your GI tract during the procedure and reduce the bloating. If you had a lower endoscopy (such as a colonoscopy or flexible sigmoidoscopy) you may notice spotting of blood in your stool or on the toilet paper. If you underwent a bowel prep for your procedure, you may not have a normal bowel movement for a few days.  Please Note:  You might notice some irritation and congestion in your nose or some drainage.  This is from the oxygen used during your procedure.  There is no need for concern and it should clear up in a day or so.  SYMPTOMS TO REPORT IMMEDIATELY:   Following lower endoscopy (colonoscopy or flexible sigmoidoscopy):  Excessive amounts of blood in the stool  Significant tenderness or worsening of abdominal pains  Swelling of the abdomen that is new, acute  Fever of 100F or higher   For urgent or emergent issues, a gastroenterologist can be reached at any hour by calling [phone].   DIET: Your first meal following the procedure should  be a small meal and then it is ok to progress to your normal diet. Heavy or fried foods are harder to digest and may make you feel nauseous or bloated.  Likewise, meals heavy in dairy and vegetables can increase bloating.  Drink plenty of fluids but you should avoid alcoholic beverages for 24 hours.  ACTIVITY:  You should plan to take it easy for the rest of today and you should NOT DRIVE or use heavy machinery until tomorrow (because of the sedation medicines used during the test).    FOLLOW UP: Our staff will call the number listed on your records the next business day following your procedure to check on you and address any questions or concerns that you may have regarding the information given to you following your procedure. If we do not reach you, we will leave a message.  However, if you are feeling well and you are not experiencing any problems, there is no need to return our call.  We will assume that you have returned to your regular daily activities without incident.  If any biopsies were taken you will be contacted by phone or by letter within the next 1-3 weeks.  Please call us at [phone] if you have not heard about the biopsies in 3 weeks.    SIGNATURES/CONFIDENTIALITY: You and/or your care partner have signed paperwork which will be entered into your electronic medical record.  These signatures attest to  the fact that that the information above on your After Visit Summary has been reviewed and is understood.  Full responsibility of the confidentiality of this discharge information lies with you and/or your care-partner.    Handouts were given to your care partner on polyps and diverticulosis.  No aspirin, aspirin products,  ibuprofen, naproxen, advil, motrin, aleve, or other non-steroidal anti-inflammatory drugs for 14 days after polyp removal. You may resume your other current medications today. Await biopsy results Dr. Carlean Purl will call you about resection options of cecal  polyp.. Please call if any questions or concerns.

## 2015-07-22 NOTE — Progress Notes (Signed)
Patient awakening,vss,report to rn 

## 2015-07-22 NOTE — Progress Notes (Signed)
When pt first came into the recovery room, he was pale in his face.  He was awake and he did complain of some nausea.  I encouraged him to pass flatus and he did start doing so.  When he passed a good amount of flatus, pt said his nausea was much better.  No further complaints noted in the recovery room. maw

## 2015-07-23 ENCOUNTER — Telehealth: Payer: Self-pay | Admitting: *Deleted

## 2015-07-23 NOTE — Telephone Encounter (Signed)
  Follow up Call-  Call back number 07/22/2015  Post procedure Call Back phone  # [phone]  Permission to leave phone message Yes     Patient questions:  Do you have a fever, pain , or abdominal swelling? No. Pain Score  0 *  Have you tolerated food without any problems? Yes.    Have you been able to return to your normal activities? Yes.    Do you have any questions about your discharge instructions: Diet   No. Medications  No. Follow up visit  No.  Do you have questions or concerns about your Care? No.  Actions: * If pain score is 4 or above: No action needed, pain <4.

## 2015-07-24 ENCOUNTER — Encounter: Payer: Self-pay | Admitting: Family Medicine

## 2015-08-06 ENCOUNTER — Encounter: Payer: Self-pay | Admitting: Family Medicine

## 2015-08-06 ENCOUNTER — Encounter: Payer: Self-pay | Admitting: Internal Medicine

## 2015-08-06 DIAGNOSIS — D120 Benign neoplasm of cecum: Secondary | ICD-10-CM

## 2015-08-06 HISTORY — DX: Benign neoplasm of cecum: D12.0

## 2015-08-06 NOTE — Progress Notes (Signed)
Quick Note:  10 are adenoma or ssp Needs colonoscopy at hospital with general anesthesia (due to respiratory motion) to remove a 25 mm cecal polyp. I have called results to him so we do not need [place] letter.  Please look at 1PM July 12 or 19 at [facility] and let me know   ______

## 2015-08-07 NOTE — Progress Notes (Signed)
Quick Note:  130 0n 7/19 is gtreat ______

## 2015-08-08 ENCOUNTER — Other Ambulatory Visit: Payer: Self-pay

## 2015-08-08 DIAGNOSIS — K635 Polyp of colon: Secondary | ICD-10-CM

## 2015-08-08 MED ORDER — NA SULFATE-K SULFATE-MG SULF 17.5-3.13-1.6 GM/177ML PO SOLN: ORAL | Status: DC

## 2015-09-02 ENCOUNTER — Encounter (HOSPITAL_COMMUNITY): Payer: Self-pay | Admitting: *Deleted

## 2015-09-10 ENCOUNTER — Ambulatory Visit (HOSPITAL_COMMUNITY): Payer: Medicare Other | Admitting: Anesthesiology

## 2015-09-10 ENCOUNTER — Encounter (HOSPITAL_COMMUNITY): Payer: Self-pay | Admitting: *Deleted

## 2015-09-10 ENCOUNTER — Ambulatory Visit (HOSPITAL_COMMUNITY)
Admission: RE | Admit: 2015-09-10 | Discharge: 2015-09-10 | Disposition: A | Discharge status: Home / Self Care | Payer: Medicare Other | Source: Ambulatory Visit | Attending: Internal Medicine | Admitting: Internal Medicine

## 2015-09-10 ENCOUNTER — Encounter (HOSPITAL_COMMUNITY)
Admission: RE | Disposition: A | Discharge status: Home / Self Care | Payer: Self-pay | Source: Ambulatory Visit | Attending: Internal Medicine

## 2015-09-10 DIAGNOSIS — Z9049 Acquired absence of other specified parts of digestive tract: Secondary | ICD-10-CM | POA: Insufficient documentation

## 2015-09-10 DIAGNOSIS — D122 Benign neoplasm of ascending colon: Secondary | ICD-10-CM | POA: Diagnosis not present

## 2015-09-10 DIAGNOSIS — K635 Polyp of colon: Secondary | ICD-10-CM | POA: Insufficient documentation

## 2015-09-10 DIAGNOSIS — D120 Benign neoplasm of cecum: Secondary | ICD-10-CM | POA: Diagnosis not present

## 2015-09-10 DIAGNOSIS — Z86010 Personal history of colonic polyps: Secondary | ICD-10-CM | POA: Insufficient documentation

## 2015-09-10 DIAGNOSIS — K5730 Diverticulosis of large intestine without perforation or abscess without bleeding: Secondary | ICD-10-CM | POA: Insufficient documentation

## 2015-09-10 DIAGNOSIS — Z96652 Presence of left artificial knee joint: Secondary | ICD-10-CM | POA: Insufficient documentation

## 2015-09-10 DIAGNOSIS — Z6841 Body Mass Index (BMI) 40.0 and over, adult: Secondary | ICD-10-CM | POA: Diagnosis not present

## 2015-09-10 DIAGNOSIS — G4733 Obstructive sleep apnea (adult) (pediatric): Secondary | ICD-10-CM | POA: Insufficient documentation

## 2015-09-10 DIAGNOSIS — K5790 Diverticulosis of intestine, part unspecified, without perforation or abscess without bleeding: Secondary | ICD-10-CM | POA: Diagnosis not present

## 2015-09-10 HISTORY — PX: COLONOSCOPY: SHX5424

## 2015-09-10 SURGERY — COLONOSCOPY
Anesthesia: Monitor Anesthesia Care

## 2015-09-10 MED ORDER — MIDAZOLAM HCL 2 MG/2ML IJ SOLN
INTRAMUSCULAR | Status: AC
  Filled 2015-09-10: qty 2

## 2015-09-10 MED ORDER — SODIUM CHLORIDE 0.9 % IJ SOLN
INTRAMUSCULAR | Status: DC | PRN
  Administered 2015-09-10: 7.5 mL via INTRAVENOUS

## 2015-09-10 MED ORDER — LACTATED RINGERS IV SOLN
INTRAVENOUS | Status: DC
  Administered 2015-09-10: 15:00:00 via INTRAVENOUS
  Administered 2015-09-10: 1000 mL via INTRAVENOUS

## 2015-09-10 MED ORDER — KETAMINE HCL 10 MG/ML IJ SOLN
INTRAMUSCULAR | Status: DC | PRN
  Administered 2015-09-10: 20 mg via INTRAVENOUS

## 2015-09-10 MED ORDER — KETAMINE HCL 10 MG/ML IJ SOLN
INTRAMUSCULAR | Status: AC
  Filled 2015-09-10: qty 1

## 2015-09-10 MED ORDER — PROPOFOL 10 MG/ML IV BOLUS
INTRAVENOUS | Status: AC
  Filled 2015-09-10: qty 20

## 2015-09-10 MED ORDER — PROPOFOL 10 MG/ML IV BOLUS
INTRAVENOUS | Status: DC | PRN
Start: 2015-09-10 — End: 2015-09-10
  Administered 2015-09-10 (×2): 20 mg via INTRAVENOUS
  Administered 2015-09-10: 10 mg via INTRAVENOUS
  Administered 2015-09-10 (×6): 20 mg via INTRAVENOUS
  Administered 2015-09-10: 10 mg via INTRAVENOUS
  Administered 2015-09-10 (×2): 20 mg via INTRAVENOUS
  Administered 2015-09-10: 10 mg via INTRAVENOUS
  Administered 2015-09-10 (×8): 20 mg via INTRAVENOUS
  Administered 2015-09-10: 10 mg via INTRAVENOUS
  Administered 2015-09-10: 20 mg via INTRAVENOUS
  Administered 2015-09-10 (×2): 10 mg via INTRAVENOUS
  Administered 2015-09-10 (×2): 20 mg via INTRAVENOUS

## 2015-09-10 MED ORDER — SODIUM CHLORIDE 0.9 % IV SOLN: INTRAVENOUS | Status: DC

## 2015-09-10 MED ORDER — FENTANYL CITRATE (PF) 100 MCG/2ML IJ SOLN
INTRAMUSCULAR | Status: DC | PRN
  Administered 2015-09-10: 50 ug via INTRAVENOUS

## 2015-09-10 MED ORDER — SODIUM CHLORIDE 0.9 % IJ SOLN
INTRAMUSCULAR | Status: AC
  Filled 2015-09-10: qty 10

## 2015-09-10 MED ORDER — FENTANYL CITRATE (PF) 100 MCG/2ML IJ SOLN
INTRAMUSCULAR | Status: AC
  Filled 2015-09-10: qty 2

## 2015-09-10 MED ORDER — MIDAZOLAM HCL 5 MG/5ML IJ SOLN
INTRAMUSCULAR | Status: DC | PRN
  Administered 2015-09-10: 2 mg via INTRAVENOUS

## 2015-09-10 NOTE — Anesthesia Preprocedure Evaluation (Addendum)
Anesthesia Evaluation  Patient identified by MRN, date of birth, ID band Patient awake    Reviewed: Allergy & Precautions, H&P , NPO status , Patient's Chart, lab work & pertinent test results  Airway Mallampati: I  TM Distance: >3 FB Neck ROM: full    Dental   Pulmonary sleep apnea ,    Pulmonary exam normal        Cardiovascular  Rhythm:regular Rate:Normal     Neuro/Psych    GI/Hepatic (+) Hepatitis -  Endo/Other  Morbid obesity  Renal/GU      Musculoskeletal   Abdominal   Peds  Hematology   Anesthesia Other Findings   Reproductive/Obstetrics                            Anesthesia Physical  Anesthesia Plan  ASA: III  Anesthesia Plan: MAC   Post-op Pain Management:    Induction: Intravenous  Airway Management Planned:   Additional Equipment:   Intra-op Plan:   Post-operative Plan:   Informed Consent: I have reviewed the patients History and Physical, chart, labs and discussed the procedure including the risks, benefits and alternatives for the proposed anesthesia with the patient or authorized representative who has indicated his/her understanding and acceptance.   Dental advisory given  Plan Discussed with: CRNA  Anesthesia Plan Comments:         Anesthesia Quick Evaluation

## 2015-09-10 NOTE — H&P (Signed)
[facility] History and Physical   Primary Care Physician:  Ria Bush, MD   Reason for Procedure:   remove colon polyp  Plan:    Colonoscopy and polypectomy     HPI: Dennis Ayers is a 65 y.o. male w/ 2.5 cm carpet-like polyp in cecum that needs to be removed.   Past Medical History  Diagnosis Date  . Hyperglycemia   . Morbid obesity ([place])   . Knee pain   . Jaundice     with icterus, when very young, treated and resolved  . Viral hepatitis 8th grade    has had jaundice in past, unsure of cause ?Hep A  . DJD (degenerative joint disease) of knee     [place] steroid injection (01/2014)  . Osteoarthritis of knee 07/23/2011    blat s/p L TKR ([place])  . Severe obesity (BMI >= 40) ([place]) 02/15/2008  . OSA (obstructive sleep apnea)     mild to mod, working on weight loss, no cpap needed per test    Past Surgical History  Procedure Laterality Date  . 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 Alphonzo Severance  . Cholecystectomy  1998  . Tonsillectomy    . Colonoscopy  08/20/2011    4 adenomatous polyps - rpt 3 yrs Gatha Mayer)  . Eye surgery Right     laser  . Total knee arthroplasty Left 07/25/2012    Surgeon: Johnny Bridge, MD  . Colonoscopy  07-22-15    mult TAs, severe diverticulosis, one large 2.5cm cecal polyp pending resection Carlean Purl)    Prior to Admission medications   Medication Sig Start Date End Date Taking? Authorizing Provider  Krill Oil 300 MG CAPS Take 300 mg by mouth daily.   Yes Historical Provider, MD  Misc Natural Products (OSTEO BI-FLEX JOINT SHIELD PO) Take 1 capsule by mouth daily.    Yes Historical Provider, MD  Multiple Vitamin (MULTIVITAMIN) tablet Take 1 tablet by mouth daily.   Yes Historical Provider, MD  sildenafil (VIAGRA) 100 MG tablet Take 0.5-1 tablets (50-100 mg total) by mouth daily as needed for erectile dysfunction. Patient not taking: Reported on 08/27/2015 07/10/15   Ria Bush, MD    Current Facility-Administered  Medications  Medication Dose Route Frequency Provider Last Rate Last Dose  . 0.9 %  sodium chloride infusion   Intravenous Continuous Gatha Mayer, MD      . lactated ringers infusion   Intravenous Continuous Gatha Mayer, MD 125 mL/hr at 09/10/15 1236 1,000 mL at 09/10/15 1236    Allergies as of 08/08/2015  . (No Known Allergies)    Family History  Problem Relation Age of Onset  . Coronary artery disease Father 34    CABG X4  . Hypertension Father   . Ovarian cancer Mother   . Ulcers Mother     stomach  . Diabetes Neg Hx   . Prostate cancer Neg Hx   . Breast cancer Neg Hx   . Colon cancer Neg Hx   . Depression Neg Hx   . Alcohol abuse Neg Hx   . Stroke Father     Social History   Social History  . Marital Status: Married    Spouse Name: N/A  . Number of Children: 1  . Years of Education: N/A   Occupational History  . Self-employed    Social History Main Topics  . Smoking status: Never Smoker   . Smokeless tobacco: Never Used  . Alcohol Use:  No     Comment: occasionally in past, not anymore for 20 years  . Drug Use: No  . Sexual Activity: Not on file   Other Topics Concern  . Not on file   Social History Narrative   Caffeine: 2-3 diet sodas/day   Lives with wife   1 grown son.   Occupation: Self-employed; Psychologist, forensic   Activity: some golf, limited by knee pain   Diet: good water, daily vegetables    Review of Systems:  All other review of systems negative except as mentioned in the HPI.  Physical Exam: Vital signs in last 24 hours: Temp:  [97.4 F (36.3 C)] 97.4 F (36.3 C) (07/19 1230) Pulse Rate:  [73] 73 (07/19 1230) Resp:  [20] 20 (07/19 1230) BP: (166)/(74) 166/74 mmHg (07/19 1230) SpO2:  [97 %] 97 % (07/19 1230) Weight:  [325 lb (147.419 kg)] 325 lb (147.419 kg) (07/19 1230)   General:   Alert,  Well-developed, well-nourished, pleasant and cooperative in NAD Lungs:  Clear throughout to auscultation.    Heart:  Regular rate and rhythm; no murmurs, clicks, rubs,  or gallops. Abdomen:  Soft, obese, nontender and nondistended. Normal bowel sounds.   Neuro/Psych:  Alert and cooperative. Normal mood and affect. A and O x 3   @Dennis Ayers  Simonne Maffucci, MD, [facility] [phone] (pager) 09/10/2015 1:36 PM@

## 2015-09-10 NOTE — Transfer of Care (Signed)
Immediate Anesthesia Transfer of Care Note  Patient: Dennis Ayers  Procedure(s) Performed: Procedure(s): COLONOSCOPY (N/A)  Patient Location: PACU  Anesthesia Type:MAC  Level of Consciousness: awake, alert , oriented and patient cooperative  Airway & Oxygen Therapy: Patient Spontanous Breathing and Patient connected to face mask oxygen  Post-op Assessment: Report given to RN, Post -op Vital signs reviewed and stable and Patient moving all extremities X 4  Post vital signs: stable  Last Vitals:  Filed Vitals:   09/10/15 1230 09/10/15 1451  BP: 166/74 145/79  Pulse: 73 67  Temp: 36.3 C   Resp: 20 19    Last Pain: There were no vitals filed for this visit.       Complications: No apparent anesthesia complications

## 2015-09-10 NOTE — Anesthesia Postprocedure Evaluation (Signed)
Anesthesia Post Note  Patient: Dennis Ayers  Procedure(s) Performed: Procedure(s) (LRB): COLONOSCOPY (N/A)  Patient location during evaluation: PACU Anesthesia Type: MAC Level of consciousness: awake and alert Pain management: pain level controlled Vital Signs Assessment: post-procedure vital signs reviewed and stable Respiratory status: spontaneous breathing Cardiovascular status: stable Anesthetic complications: no    Last Vitals:  Filed Vitals:   09/10/15 1500 09/10/15 1510  BP: 148/112 150/84  Pulse: 67 60  Temp:    Resp: 21 13    Last Pain: There were no vitals filed for this visit.               Nolon Nations

## 2015-09-10 NOTE — Discharge Instructions (Addendum)
° °  I removed the large polyp and one other small one.  I will let you know results and plans.  I appreciate the opportunity to care for you. Gatha Mayer, MD, [facility]al

## 2015-09-10 NOTE — Op Note (Addendum)
[facility] Patient Name: Dennis Ayers Procedure Date: 09/10/2015 MRN: [id number] Attending MD: Gatha Mayer , MD Date of Birth: 1950/07/12 CSN: [id number] Age: 65 Admit Type: Ambulatory Procedure:                Colonoscopy Indications:              Adenomatous polyps in the colon, For therapy of                            adenomatous polyps in the colon Providers:                Gatha Mayer, MD, Zenon Mayo, RN, Elspeth Cho Tech., Technician, Derrek Gu. Alday CRNA,                            CRNA Referring MD:              Medicines:                Propofol per Anesthesia, Monitored Anesthesia Care Complications:            No immediate complications. Estimated Blood Loss:     Estimated blood loss was minimal. Procedure:                Pre-Anesthesia Assessment:                           - Prior to the procedure, a History and Physical                            was performed, and patient medications and                            allergies were reviewed. The patient's tolerance of                            previous anesthesia was also reviewed. The risks                            and benefits of the procedure and the sedation                            options and risks were discussed with the patient.                            All questions were answered, and informed consent                            was obtained. Prior Anticoagulants: The patient has                            taken no previous anticoagulant or antiplatelet  agents. ASA Grade Assessment: III - A patient with                            severe systemic disease. After reviewing the risks                            and benefits, the patient was deemed in                            satisfactory condition to undergo the procedure.                           After obtaining informed consent, the colonoscope   was passed under direct vision. Throughout the                            procedure, the patient's blood pressure, pulse, and                            oxygen saturations were monitored continuously. The                            [id number]) scope was introduced through                            the anus and advanced to the the cecum, identified                            by appendiceal orifice and ileocecal valve. The                            ileocecal valve and the appendiceal orifice were                            photographed. The bowel preparation used was                            Miralax. The colonoscopy was technically difficult                            and complex due to significant looping. Successful                            completion of the procedure was aided by changing                            the patient to a supine position and applying                            abdominal pressure. The patient tolerated the                            procedure well. The quality of the bowel  preparation was good. Scope In: 1:45:31 PM Scope Out: 2:37:54 PM Scope Withdrawal Time: 0 hours 28 minutes 16 seconds  Total Procedure Duration: 0 hours 52 minutes 23 seconds  Findings:      The perianal and digital rectal examinations were normal.      A 25 mm polyp was found in the cecum. The polyp was carpet-like.       Polypectomy was attempted, initially using a saline injection-lift       technique with a hot snare. Polyp resection was incomplete with this       device. This intervention then required a different device and       polypectomy technique. The polyp was removed with a piecemeal technique       using a hot snare. Resection and retrieval were complete. Estimated       blood loss was minimal. Coagulation for destruction of remaining portion       of lesion using argon plasma was successful. Estimated blood loss: none.       To prevent  bleeding post-intervention, four hemostatic clips were       successfully placed (MR conditional). There was no bleeding at the end       of the procedure.      A 4 mm polyp was found in the ascending colon. The polyp was sessile.       The polyp was removed with a cold biopsy forceps. Resection and       retrieval were complete. Verification of patient identification for the       specimen was done. Estimated blood loss was minimal.      Diverticula were found in the sigmoid colon.      The exam was otherwise without abnormality. Impression:               - One 25 mm polyp in the cecum, removed piecemeal                            using a hot snare. Resected and retrieved. Treated                            with argon plasma coagulation (APC). Clips (MR                            conditional) were placed.                           - One 4 mm polyp in the ascending colon, removed                            with a cold biopsy forceps. Resected and retrieved.                           - Severe diverticulosis in the sigmoid colon.                           - The examination was otherwise normal on direct                            and retroflexion views.                           -  Personal history of colonic polyps. Moderate Sedation:      Please see anesthesia notes, moderate sedation not given Recommendation:           - Patient has a contact number available for                            emergencies. The signs and symptoms of potential                            delayed complications were discussed with the                            patient. Return to normal activities tomorrow.                            Written discharge instructions were provided to the                            patient.                           - Resume previous diet.                           - Continue present medications.                           - No aspirin, ibuprofen, naproxen, or other                             non-steroidal anti-inflammatory drugs for 2 weeks                            after polyp removal.                           - Repeat colonoscopy is recommended. The                            colonoscopy date will be determined after pathology                            results from today's exam become available for                            review. Procedure Code(s):        --- Professional ---                           [phone], Colonoscopy, flexible; with removal of                            tumor(s), polyp(s), or other lesion(s) by snare                            technique  [id number], 34, Colonoscopy, flexible; with biopsy,                            single or multiple Diagnosis Code(s):        --- Professional ---                           D12.0, Benign neoplasm of cecum                           D12.2, Benign neoplasm of ascending colon                           Z86.010, Personal history of colonic polyps                           D12.6, Benign neoplasm of colon, unspecified                           K57.30, Diverticulosis of large intestine without                            perforation or abscess without bleeding CPT copyright 2016 [employer]. All rights reserved. The codes documented in this report are preliminary and upon coder review may  be revised to meet current compliance requirements. Gatha Mayer, MD 09/10/2015 2:58:13 PM This report has been signed electronically. Number of Addenda: 1 Addendum Number: 1   Addendum Date: 09/10/2015 3:01:51 PM      Placed an extra large abdominal binder and oved supine w/ LLQ pressure       to enter cecum.      Would do again Gatha Mayer, MD 09/10/2015 3:02:19 PM This report has been signed electronically.

## 2015-09-11 ENCOUNTER — Encounter (HOSPITAL_COMMUNITY): Payer: Self-pay | Admitting: Internal Medicine

## 2015-09-12 ENCOUNTER — Encounter (HOSPITAL_COMMUNITY): Payer: Self-pay | Admitting: Family Medicine

## 2015-09-12 NOTE — Progress Notes (Signed)
Quick Note:  Polyp benign adenoma Please let him know and colon recall 1 year ______

## 2015-10-08 ENCOUNTER — Other Ambulatory Visit: Payer: PRIVATE HEALTH INSURANCE

## 2015-10-13 ENCOUNTER — Ambulatory Visit: Payer: PRIVATE HEALTH INSURANCE | Admitting: Family Medicine

## 2015-12-17 ENCOUNTER — Encounter: Payer: Self-pay | Admitting: Internal Medicine

## 2016-01-12 DIAGNOSIS — Z96652 Presence of left artificial knee joint: Secondary | ICD-10-CM | POA: Diagnosis not present

## 2016-01-12 DIAGNOSIS — M1711 Unilateral primary osteoarthritis, right knee: Secondary | ICD-10-CM | POA: Diagnosis not present

## 2016-05-13 ENCOUNTER — Telehealth: Payer: Self-pay

## 2016-05-13 DIAGNOSIS — Z8249 Family history of ischemic heart disease and other diseases of the circulatory system: Secondary | ICD-10-CM

## 2016-05-13 NOTE — Telephone Encounter (Signed)
Referral placed. nonurgent.

## 2016-05-13 NOTE — Telephone Encounter (Signed)
Pt left v/m; pt sister had heart attack last week and pts sisters doctor said it is definitely hereditary and pt should see cardiologist; pt request referral to cardiologist. Last annual 07/10/15. No future appt scheduled.

## 2016-05-14 NOTE — Telephone Encounter (Signed)
Appt made and patient aware.  

## 2016-05-19 ENCOUNTER — Encounter: Payer: Self-pay | Admitting: Internal Medicine

## 2016-05-19 ENCOUNTER — Ambulatory Visit (INDEPENDENT_AMBULATORY_CARE_PROVIDER_SITE_OTHER): Payer: Medicare Other | Admitting: Internal Medicine

## 2016-05-19 VITALS — BP 144/78 | HR 97 | Ht 75.5 in | Wt 333.5 lb

## 2016-05-19 DIAGNOSIS — Z8249 Family history of ischemic heart disease and other diseases of the circulatory system: Secondary | ICD-10-CM

## 2016-05-19 DIAGNOSIS — Z79899 Other long term (current) drug therapy: Secondary | ICD-10-CM | POA: Diagnosis not present

## 2016-05-19 DIAGNOSIS — I119 Hypertensive heart disease without heart failure: Secondary | ICD-10-CM

## 2016-05-19 DIAGNOSIS — I10 Essential (primary) hypertension: Secondary | ICD-10-CM | POA: Diagnosis not present

## 2016-05-19 MED ORDER — LISINOPRIL 5 MG PO TABS: 5.0000 mg | ORAL_TABLET | Freq: Every day | ORAL | 3 refills | Status: DC

## 2016-05-19 NOTE — Progress Notes (Signed)
New Outpatient Visit Date: 05/19/2016  Referring Provider: Ria Bush, MD [address] [place], [place] [address]  Chief Complaint: Family history of heart disease  HPI:  Dennis Ayers is a 66 y.o. year-old male with history of hyperglycemia, morbid obesity, obstructive sleep apnea, and degenerative joint disease involving the knees, who has been referred by Dr. [name] for cardiovascular disease given family history of ASCVD. Patient is particularly concerned because his sister (age 59) had a heart attack last week requiring stent placement at [facility]. She had a history of hyperlipidemia but otherwise was healthy. Dennis Ayers has been asymptomatic. He denies chest pain, shortness of breath, palpitations, lightheadedness, orthopnea, PND, leg edema, and claudication. He does not have a history of cardiovascular disease. However, in light of his wife's recent heart attack, he would like to be evaluated for further risk stratification. He has not previously undergone cardiovascular testing.  --------------------------------------------------------------------------------------------------  Cardiovascular History & Procedures: Cardiovascular Problems:  None  Risk Factors:  Obesity, sedentary lifestyle, male gender, age greater than 58, and family history  Cath/PCI:  None  CV Surgery:  None  EP Procedures and Devices:  None  Non-Invasive Evaluation(s):  None  Recent CV Pertinent Labs: Lab Results  Component Value Date   CHOL 150 06/27/2015   HDL 45.80 06/27/2015   LDLCALC 84 06/27/2015   TRIG 99.0 06/27/2015   CHOLHDL 3 06/27/2015   INR 1.04 07/19/2012   K 4.0 06/27/2015   BUN 18 06/27/2015   CREATININE 0.77 06/27/2015    --------------------------------------------------------------------------------------------------  Past Medical History:  Diagnosis Date  . DJD (degenerative joint disease) of knee    [place] steroid injection (01/2014)  .  Hyperglycemia   . Jaundice    with icterus, when very young, treated and resolved  . Knee pain   . Morbid obesity ([place])   . OSA (obstructive sleep apnea)    mild to mod, working on weight loss, no cpap needed per test  . Osteoarthritis of knee 07/23/2011   blat s/p L TKR ([place])  . Severe obesity (BMI >= 40) ([place]) 02/15/2008  . Viral hepatitis 8th grade   has had jaundice in past, unsure of cause ?Hep A    Past Surgical History:  Procedure Laterality Date  . CHOLECYSTECTOMY  1998  . COLONOSCOPY  08/20/2011   4 adenomatous polyps - rpt 3 yrs Dennis Ayers)  . COLONOSCOPY  07-22-15   mult TAs, severe diverticulosis, one large 2.5cm cecal polyp pending resection Dennis Ayers)  . COLONOSCOPY N/A 09/10/2015   large TA, rpt 1 yr Dennis Mayer, MD)  . EYE SURGERY Right    laser  . TONSILLECTOMY    . TOTAL KNEE ARTHROPLASTY Left 07/25/2012   Surgeon: Dennis Bridge, MD  . 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 Alphonzo Severance    Outpatient Encounter Prescriptions as of 05/19/2016  Medication Sig  . aspirin EC 81 MG tablet Take 81 mg by mouth daily.  Javier Docker Oil 300 MG CAPS Take 300 mg by mouth daily.  . Misc Natural Products (OSTEO BI-FLEX JOINT SHIELD PO) Take 1 capsule by mouth daily.   . Multiple Vitamin (MULTIVITAMIN) tablet Take 1 tablet by mouth daily.  . sildenafil (VIAGRA) 100 MG tablet Take 0.5-1 tablets (50-100 mg total) by mouth daily as needed for erectile dysfunction.   No facility-administered encounter medications on file as of 05/19/2016.     Allergies: Patient has no known allergies.  Social History   Social History  .  Marital status: Married    Spouse name: N/A  . Number of children: 1  . Years of education: N/A   Occupational History  . Self-employed    Social History Main Topics  . Smoking status: Never Smoker  . Smokeless tobacco: Never Used  . Alcohol use No     Comment: occasionally in past, not anymore for 20 years  . Drug use: No  . Sexual activity: Not  on file   Other Topics Concern  . Not on file   Social History Narrative   Caffeine: 2-3 diet sodas/day   Lives with wife   1 grown son.   Occupation: Self-employed; Psychologist, forensic   Activity: some golf, limited by knee pain   Diet: good water, daily vegetables    Family History  Problem Relation Age of Onset  . Ovarian cancer Mother   . Ulcers Mother     stomach  . Coronary artery disease Father 31    CABG X4  . Hypertension Father   . Stroke Father   . Heart attack Father   . Heart attack Sister   . Heart Problems Paternal Aunt   . Heart attack Paternal Uncle   . Heart attack Paternal Aunt   . Heart attack Paternal Uncle   . Diabetes Neg Hx   . Prostate cancer Neg Hx   . Breast cancer Neg Hx   . Colon cancer Neg Hx   . Depression Neg Hx   . Alcohol abuse Neg Hx     Review of Systems: A 12-system review of systems was performed and was negative except as noted in the HPI.  --------------------------------------------------------------------------------------------------  Physical Exam: BP (!) 144/78 (BP Location: Right Arm, Patient Position: Sitting, Cuff Size: Large)   Pulse 97   Ht 6' 3.5" (1.918 m)   Wt (!) 333 lb 8 oz (151.3 kg)   BMI 41.13 kg/m   General:  Morbidly obese man, seated comfortably in the exam room. HEENT: No conjunctival pallor or scleral icterus.  Moist mucous membranes.  OP clear. Neck: Supple without lymphadenopathy, thyromegaly, JVD, or HJR.  No carotid bruit. Lungs: Normal work of breathing.  Clear to auscultation bilaterally without wheezes or crackles. Heart: Regular rate and rhythm without murmurs, rubs, or gallops.  Unable to assess PMI due to body habitus. Abd: Bowel sounds present.  Soft, NT/ND. Unable to assess hepatosplenomegaly due to body habitus. Ext: No lower extremity edema.  Radial, PT, and DP pulses are 2+ bilaterally Skin: warm and dry without rash Neuro: CNIII-XII intact.  Strength and  fine-touch sensation intact in upper and lower extremities bilaterally. Psych: Normal mood and affect.  EKG:  Normal sinus rhythm (heart rate 97 bpm) with left anterior fascicular block. Compared with prior tracing from 07/10/15, left anterior fascicular block is evident (I have personally reviewed both tracings).  Lab Results  Component Value Date   WBC 7.0 07/28/2012   HGB 13.0 07/28/2012   HCT 37.6 (L) 07/28/2012   MCV 91.5 07/28/2012   PLT 186 07/28/2012    Lab Results  Component Value Date   NA 140 06/27/2015   K 4.0 06/27/2015   CL 104 06/27/2015   CO2 27 06/27/2015   BUN 18 06/27/2015   CREATININE 0.77 06/27/2015   GLUCOSE 189 (H) 06/27/2015   ALT 46 07/19/2012    Lab Results  Component Value Date   CHOL 150 06/27/2015   HDL 45.80 06/27/2015   LDLCALC 84 06/27/2015   TRIG 99.0 06/27/2015  CHOLHDL 3 06/27/2015    --------------------------------------------------------------------------------------------------  ASSESSMENT AND PLAN: Cardiovascular risk assessment with family history of premature atherosclerotic cardiovascular disease Patient has multiple cardiovascular risk factors, including obesity, sedentary lifestyle, male gender, age greater than 42, and family history. He is particularly concerned given his sister's recent MI. We have discussed risk factor modification and primary prevention. His 10-year atherosclerotic cardiovascular disease risk is approximately 15%. He would like further objective evaluation for cardiovascular disease burden. Given that he is asymptomatic, we have therefore agreed to perform a coronary calcium score CT. If this demonstrates significant coronary calcium, we will need to consider initiation of statin therapy and potential cardiac stress test to exclude silent ischemia.  Hypertension with hypertensive heart disease Patient denies a history of hypertension, though review of his chart demonstrates elevated blood pressure readings on  multiple occasions over the last year. We discussed lifestyle modifications, including low-sodium diet, exercise, and weight loss. We have also agreed to initiate lisinopril 5 mg daily. I will check a basic metabolic panel today and again in 2 weeks to ensure stable renal function and electrolytes. I will also obtain a transthoracic echocardiogram to evaluate for structural heart disease, given subtle EKG abnormalities, hypertension, and history of sleep apnea.  Follow-up: Return to clinic in 3 months.  Nelva Bush, MD 05/20/2016 1:32 PM

## 2016-05-19 NOTE — Patient Instructions (Addendum)
Medication Instructions:  Your physician has recommended you make the following change in your medication:  1- START Lisinopril 5 mg (1 tablet) by mouth once a day.  Labwork: Your physician recommends that you return for lab work in: [place] ([facility]).  Your physician recommends that you return for lab work in: [place] 06/02/16. - Please go to the  [facility]. You will check in at the front desk to the right as you walk into the atrium. Valet Parking is offered if needed.     Testing/Procedures: Your physician has requested that you have an echocardiogram. Echocardiography is a painless test that uses sound waves to create images of your heart. It provides your doctor with information about the size and shape of your heart and how well your heart's chambers and valves are working. This procedure takes approximately one hour. There are no restrictions for this procedure.  Your physician has order a CT Calcium Scoring. This procedure uses special x-ray equipment to produce pictures of the coronary arteries to determine if they are blocked or narrowed by the buildup of plaque - an indicator for atherosclerosis or coronary artery disease (CAD).  - Scheduled for June 04, 2016, arrive at 08:15 am (this is 15 min prior to your appt time of 08:30 am). - No special instructions prior to CT. - It will be a cost of $150.00. Please have payment available when you check in.   Follow-Up: Your physician recommends that you schedule a follow-up appointment in: 3 MONTHS WITH DR END.  Echocardiogram An echocardiogram, or echocardiography, uses sound waves (ultrasound) to produce an image of your heart. The echocardiogram is simple, painless, obtained within a short period of time, and offers valuable information to your health care provider. The images from an echocardiogram can provide information such as:  Evidence of coronary artery disease (CAD).  Heart size.  Heart muscle function.  Heart  valve function.  Aneurysm detection.  Evidence of a past heart attack.  Fluid buildup around the heart.  Heart muscle thickening.  Assess heart valve function. Tell a health care provider about:  Any allergies you have.  All medicines you are taking, including vitamins, herbs, eye drops, creams, and over-the-counter medicines.  Any problems you or family members have had with anesthetic medicines.  Any blood disorders you have.  Any surgeries you have had.  Any medical conditions you have.  Whether you are pregnant or may be pregnant. What happens before the procedure? No special preparation is needed. Eat and drink normally. What happens during the procedure?  In order to produce an image of your heart, gel will be applied to your chest and a wand-like tool (transducer) will be moved over your chest. The gel will help transmit the sound waves from the transducer. The sound waves will harmlessly bounce off your heart to allow the heart images to be captured in real-time motion. These images will then be recorded.  You may need an IV to receive a medicine that improves the quality of the pictures. What happens after the procedure? You may return to your normal schedule including diet, activities, and medicines, unless your health care provider tells you otherwise. This information is not intended to replace advice given to you by your health care provider. Make sure you discuss any questions you have with your health care provider. Document Released: 02/06/2000 Document Revised: 09/27/2015 Document Reviewed: 10/16/2012 Elsevier Interactive Patient Education  2017 [employer]   Coronary Calcium Scan A coronary calcium  scan is an imaging test used to look for deposits of calcium and other fatty materials (plaques) in the inner lining of the blood vessels of the heart (coronary arteries). These deposits of calcium and plaques can partly clog and narrow the coronary arteries  without producing any symptoms or warning signs. This puts a person at risk for a heart attack. This test can detect these deposits before symptoms develop. Tell a health care provider about:  Any allergies you have.  All medicines you are taking, including vitamins, herbs, eye drops, creams, and over-the-counter medicines.  Any problems you or family members have had with anesthetic medicines.  Any blood disorders you have.  Any surgeries you have had.  Any medical conditions you have.  Whether you are pregnant or may be pregnant. What are the risks? Generally, this is a safe procedure. However, problems may occur, including:  Harm to a pregnant woman and her unborn baby. This test involves the use of radiation. Radiation exposure can be dangerous to a pregnant woman and her unborn baby. If you are pregnant, you generally should not have this procedure done.  Slight increase in the risk of cancer. This is because of the radiation involved in the test. What happens before the procedure? No preparation is needed for this procedure. What happens during the procedure?  You will undress and remove any jewelry around your neck or chest.  You will put on a hospital gown.  Sticky electrodes will be placed on your chest. The electrodes will be connected to an electrocardiogram (ECG) machine to record a tracing of the electrical activity of your heart.  A CT scanner will take pictures of your heart. During this time, you will be asked to lie still and hold your breath for 2-3 seconds while a picture of your heart is being taken. The procedure may vary among health care providers and hospitals. What happens after the procedure?  You can get dressed.  You can return to your normal activities.  It is up to you to get the results of your test. Ask your health care provider, or the department that is doing the test, when your results will be ready. Summary  A coronary calcium scan is an  imaging test used to look for deposits of calcium and other fatty materials (plaques) in the inner lining of the blood vessels of the heart (coronary arteries).  Generally, this is a safe procedure. Tell your health care provider if you are pregnant or may be pregnant.  No preparation is needed for this procedure.  A CT scanner will take pictures of your heart.  You can return to your normal activities after the scan is done. This information is not intended to replace advice given to you by your health care provider. Make sure you discuss any questions you have with your health care provider. Document Released: 08/07/2007 Document Revised: 12/29/2015 Document Reviewed: 12/29/2015 Elsevier Interactive Patient Education  2017 [employer].

## 2016-05-20 ENCOUNTER — Other Ambulatory Visit: Payer: Self-pay | Admitting: *Deleted

## 2016-05-20 DIAGNOSIS — Z8249 Family history of ischemic heart disease and other diseases of the circulatory system: Secondary | ICD-10-CM | POA: Insufficient documentation

## 2016-05-20 DIAGNOSIS — I10 Essential (primary) hypertension: Secondary | ICD-10-CM | POA: Insufficient documentation

## 2016-05-20 DIAGNOSIS — Z131 Encounter for screening for diabetes mellitus: Secondary | ICD-10-CM

## 2016-05-20 LAB — BASIC METABOLIC PANEL
BUN / CREAT RATIO: 21 (ref 10–24)
BUN: 24 mg/dL (ref 8–27)
CHLORIDE: 103 mmol/L (ref 96–106)
CO2: 20 mmol/L (ref 18–29)
CREATININE: 1.17 mg/dL (ref 0.76–1.27)
Calcium: 9.3 mg/dL (ref 8.6–10.2)
GFR, EST AFRICAN AMERICAN: 75 mL/min/{1.73_m2} (ref >59)
GFR, EST NON AFRICAN AMERICAN: 65 mL/min/{1.73_m2} (ref >59)
Glucose: 162 mg/dL — ABNORMAL HIGH (ref 65–99)
Potassium: 4.6 mmol/L (ref 3.5–5.2)
Sodium: 142 mmol/L (ref 134–144)

## 2016-06-02 ENCOUNTER — Other Ambulatory Visit
Admission: RE | Admit: 2016-06-02 | Discharge: 2016-06-02 | Disposition: A | Discharge status: Home / Self Care | Payer: Medicare Other | Source: Ambulatory Visit | Attending: Internal Medicine | Admitting: Internal Medicine

## 2016-06-02 DIAGNOSIS — Z79899 Other long term (current) drug therapy: Secondary | ICD-10-CM | POA: Diagnosis not present

## 2016-06-02 DIAGNOSIS — I119 Hypertensive heart disease without heart failure: Secondary | ICD-10-CM

## 2016-06-02 DIAGNOSIS — I10 Essential (primary) hypertension: Secondary | ICD-10-CM | POA: Insufficient documentation

## 2016-06-02 DIAGNOSIS — Z131 Encounter for screening for diabetes mellitus: Secondary | ICD-10-CM | POA: Diagnosis not present

## 2016-06-02 LAB — BASIC METABOLIC PANEL
Anion gap: 6 (ref 5–15)
BUN: 25 mg/dL — ABNORMAL HIGH (ref 6–20)
CHLORIDE: 104 mmol/L (ref 101–111)
CO2: 25 mmol/L (ref 22–32)
Calcium: 9.1 mg/dL (ref 8.9–10.3)
Creatinine, Ser: 0.96 mg/dL (ref 0.61–1.24)
GFR calc non Af Amer: >60 mL/min (ref >60)
Glucose, Bld: 179 mg/dL — ABNORMAL HIGH (ref 65–99)
Potassium: 4.3 mmol/L (ref 3.5–5.1)
Sodium: 135 mmol/L (ref 135–145)

## 2016-06-03 LAB — HEMOGLOBIN A1C
Hgb A1c MFr Bld: 8.4 % — ABNORMAL HIGH (ref 4.8–5.6)
MEAN PLASMA GLUCOSE: 194 mg/dL

## 2016-06-04 ENCOUNTER — Ambulatory Visit (INDEPENDENT_AMBULATORY_CARE_PROVIDER_SITE_OTHER)
Admission: RE | Admit: 2016-06-04 | Discharge: 2016-06-04 | Disposition: A | Discharge status: Home / Self Care | Payer: Self-pay | Source: Ambulatory Visit | Attending: Internal Medicine | Admitting: Internal Medicine

## 2016-06-04 DIAGNOSIS — Z8249 Family history of ischemic heart disease and other diseases of the circulatory system: Secondary | ICD-10-CM

## 2016-06-04 DIAGNOSIS — I10 Essential (primary) hypertension: Secondary | ICD-10-CM

## 2016-06-04 DIAGNOSIS — I119 Hypertensive heart disease without heart failure: Secondary | ICD-10-CM

## 2016-06-07 ENCOUNTER — Telehealth: Payer: Self-pay | Admitting: Internal Medicine

## 2016-06-09 NOTE — Telephone Encounter (Signed)
Results chest CT discussed with patient moderate coronary calcium score noted. Patient also informed of new diagnosis of DM. He was advised to speak with his PCP for further management of diabetes. We will follow-up as previously discussed. In light of new DM and moderate coronary artery calcium I recommend moderate to high intensity statin therapy.

## 2016-06-22 LAB — HM DIABETES EYE EXAM

## 2016-06-29 ENCOUNTER — Other Ambulatory Visit: Payer: Self-pay

## 2016-06-29 ENCOUNTER — Ambulatory Visit (INDEPENDENT_AMBULATORY_CARE_PROVIDER_SITE_OTHER): Payer: Medicare Other

## 2016-06-29 DIAGNOSIS — I10 Essential (primary) hypertension: Secondary | ICD-10-CM

## 2016-06-29 DIAGNOSIS — I119 Hypertensive heart disease without heart failure: Secondary | ICD-10-CM

## 2016-07-09 ENCOUNTER — Ambulatory Visit (INDEPENDENT_AMBULATORY_CARE_PROVIDER_SITE_OTHER): Payer: Medicare Other | Admitting: Family Medicine

## 2016-07-09 ENCOUNTER — Encounter: Payer: Self-pay | Admitting: Family Medicine

## 2016-07-09 VITALS — BP 150/70 | HR 96 | Wt 323.0 lb

## 2016-07-09 DIAGNOSIS — E1165 Type 2 diabetes mellitus with hyperglycemia: Secondary | ICD-10-CM

## 2016-07-09 DIAGNOSIS — IMO0002 Reserved for concepts with insufficient information to code with codable children: Secondary | ICD-10-CM

## 2016-07-09 DIAGNOSIS — E118 Type 2 diabetes mellitus with unspecified complications: Secondary | ICD-10-CM

## 2016-07-09 DIAGNOSIS — E1169 Type 2 diabetes mellitus with other specified complication: Secondary | ICD-10-CM | POA: Insufficient documentation

## 2016-07-09 DIAGNOSIS — I10 Essential (primary) hypertension: Secondary | ICD-10-CM | POA: Diagnosis not present

## 2016-07-09 MED ORDER — LOSARTAN POTASSIUM 25 MG PO TABS: 25.0000 mg | ORAL_TABLET | Freq: Every day | ORAL | 3 refills | Status: DC

## 2016-07-09 MED ORDER — METFORMIN HCL 500 MG PO TABS: 500.0000 mg | ORAL_TABLET | Freq: Two times a day (BID) | ORAL | 3 refills | Status: DC

## 2016-07-09 NOTE — Patient Instructions (Addendum)
New type 2 diabetes. Start metformin 500mg  nightly for 1 week then increase to twice daily.  We will refer you to diabetes classes at [facility].  Goal fasting sugars 80-120. Goal sugars 2 hours after a meal rae 100-180. Change lisinopril to losartan 25mg  daily.  Consider statin.  Check with insurance about preferred glucose meter brand and let me know.  Return in 2 months for medicare wellness visit with Katha Cabal and physical with me.   Diabetes Mellitus and Food It is important for you to manage your blood sugar (glucose) level. Your blood glucose level can be greatly affected by what you eat. Eating healthier foods in the appropriate amounts throughout the day at about the same time each day will help you control your blood glucose level. It can also help slow or prevent worsening of your diabetes mellitus. Healthy eating may even help you improve the level of your blood pressure and reach or maintain a healthy weight. General recommendations for healthful eating and cooking habits include:  Eating meals and snacks regularly. Avoid going long periods of time without eating to lose weight.  Eating a diet that consists mainly of plant-based foods, such as fruits, vegetables, nuts, legumes, and whole grains.  Using low-heat cooking methods, such as baking, instead of high-heat cooking methods, such as deep frying. Work with your dietitian to make sure you understand how to use the Nutrition Facts information on food labels. How can food affect me? Carbohydrates  Carbohydrates affect your blood glucose level more than any other type of food. Your dietitian will help you determine how many carbohydrates to eat at each meal and teach you how to count carbohydrates. Counting carbohydrates is important to keep your blood glucose at a healthy level, especially if you are using insulin or taking certain medicines for diabetes mellitus. Alcohol  Alcohol can cause sudden decreases in blood glucose  (hypoglycemia), especially if you use insulin or take certain medicines for diabetes mellitus. Hypoglycemia can be a life-threatening condition. Symptoms of hypoglycemia (sleepiness, dizziness, and disorientation) are similar to symptoms of having too much alcohol. If your health care provider has given you approval to drink alcohol, do so in moderation and use the following guidelines:  Women should not have more than one drink per day, and men should not have more than two drinks per day. One drink is equal to:  12 oz of beer.  5 oz of wine.  1 oz of hard liquor.  Do not drink on an empty stomach.  Keep yourself hydrated. Have water, diet soda, or unsweetened iced tea.  Regular soda, juice, and other mixers might contain a lot of carbohydrates and should be counted. What foods are not recommended? As you make food choices, it is important to remember that all foods are not the same. Some foods have fewer nutrients per serving than other foods, even though they might have the same number of calories or carbohydrates. It is difficult to get your body what it needs when you eat foods with fewer nutrients. Examples of foods that you should avoid that are high in calories and carbohydrates but low in nutrients include:  Trans fats (most processed foods list trans fats on the Nutrition Facts label).  Regular soda.  Juice.  Candy.  Sweets, such as cake, pie, doughnuts, and cookies.  Fried foods. What foods can I eat? Eat nutrient-rich foods, which will nourish your body and keep you healthy. The food you should eat also will depend on several factors,  including:  The calories you need.  The medicines you take.  Your weight.  Your blood glucose level.  Your blood pressure level.  Your cholesterol level. You should eat a variety of foods, including:  Protein.  Lean cuts of meat.  Proteins low in saturated fats, such as fish, egg whites, and beans. Avoid processed  meats.  Fruits and vegetables.  Fruits and vegetables that may help control blood glucose levels, such as apples, mangoes, and yams.  Dairy products.  Choose fat-free or low-fat dairy products, such as milk, yogurt, and cheese.  Grains, bread, pasta, and rice.  Choose whole grain products, such as multigrain bread, whole oats, and brown rice. These foods may help control blood pressure.  Fats.  Foods containing healthful fats, such as nuts, avocado, olive oil, canola oil, and fish. Does everyone with diabetes mellitus have the same meal plan? Because every person with diabetes mellitus is different, there is not one meal plan that works for everyone. It is very important that you meet with a dietitian who will help you create a meal plan that is just right for you. This information is not intended to replace advice given to you by your health care provider. Make sure you discuss any questions you have with your health care provider. Document Released: 11/05/2004 Document Revised: 07/17/2015 Document Reviewed: 01/05/2013 Elsevier Interactive Patient Education  2017 [employer].

## 2016-07-09 NOTE — Assessment & Plan Note (Signed)
Discussed healthy diet and lifestyle changes to affect sustainable weight loss  

## 2016-07-09 NOTE — Progress Notes (Signed)
BP (!) 150/70   Pulse 96   Wt (!) 323 lb (146.5 kg)   SpO2 97%   BMI 39.84 kg/m    CC: diabetes f/u Subjective:    Patient ID: Dennis Ayers, male    DOB: 09/04/1950, 66 y.o.   MRN: [id number]  HPI: Dennis Ayers is a 66 y.o. male presenting on 07/09/2016 for Follow-up   Saw cardiology because sister recently had heart attack.  He has returned to work, doesn't work Fridays. Tries to avoid sugar and carbs. Doesn't like water.   Last seen here 06/2015. Saw cardiology Dr End last month with A1c 8.4%. He was started on lisinopril 5mg  daily. Has noticed dry cough since starting lisinopril.   DM - regularly does not check sugars. Denies hypoglycemic symptoms. Denies paresthesias. Last diabetic eye exam: 06/2016. Pneumovax: 2017.  Prevnar: 2017.  Lab Results  Component Value Date   HGBA1C 8.4 (H) 06/02/2016   Diabetic Foot Exam - Simple   Simple Foot Form Diabetic Foot exam was performed with the following findings:  Yes 07/09/2016  8:35 AM  Visual Inspection No deformities, no ulcerations, no other skin breakdown bilaterally:  Yes Sensation Testing Intact to touch and monofilament testing bilaterally:  Yes Pulse Check See comments:  Yes Comments Diminished pulses bilaterally     Relevant past medical, surgical, family and social history reviewed and updated as indicated. Interim medical history since our last visit reviewed. Allergies and medications reviewed and updated. Outpatient Medications Prior to Visit  Medication Sig Dispense Refill  . aspirin EC 81 MG tablet Take 81 mg by mouth daily.    Aslee Such Docker Oil 300 MG CAPS Take 300 mg by mouth daily.    . Misc Natural Products (OSTEO BI-FLEX JOINT SHIELD PO) Take 1 capsule by mouth daily.     . Multiple Vitamin (MULTIVITAMIN) tablet Take 1 tablet by mouth daily.    . sildenafil (VIAGRA) 100 MG tablet Take 0.5-1 tablets (50-100 mg total) by mouth daily as needed for erectile dysfunction. 9 tablet 6  . lisinopril  (PRINIVIL,ZESTRIL) 5 MG tablet Take 1 tablet (5 mg total) by mouth daily. 90 tablet 3   No facility-administered medications prior to visit.      Per HPI unless specifically indicated in ROS section below Review of Systems     Objective:    BP (!) 150/70   Pulse 96   Wt (!) 323 lb (146.5 kg)   SpO2 97%   BMI 39.84 kg/m   Wt Readings from Last 3 Encounters:  07/09/16 (!) 323 lb (146.5 kg)  05/19/16 (!) 333 lb 8 oz (151.3 kg)  09/10/15 (!) 325 lb (147.4 kg)    Physical Exam  Constitutional: He appears well-developed and well-nourished. No distress.  HENT:  Head: Normocephalic and atraumatic.  Right Ear: External ear normal.  Left Ear: External ear normal.  Nose: Nose normal.  Mouth/Throat: Oropharynx is clear and moist. No oropharyngeal exudate.  Eyes: Conjunctivae and EOM are normal. Pupils are equal, round, and reactive to light. No scleral icterus.  Neck: Normal range of motion. Neck supple.  Cardiovascular: Normal rate, regular rhythm, normal heart sounds and intact distal pulses.   No murmur heard. Pulmonary/Chest: Effort normal and breath sounds normal. No respiratory distress. He has no wheezes. He has no rales.  Musculoskeletal: He exhibits no edema.  See HPI for foot exam if done  Lymphadenopathy:    He has no cervical adenopathy.  Skin: Skin is warm and dry. No  rash noted.  Psychiatric: He has a normal mood and affect.  Nursing note and vitals reviewed.  Results for orders placed or performed in visit on 07/09/16  HM DIABETES EYE EXAM  Result Value Ref Range   HM Diabetic Eye Exam No Retinopathy No Retinopathy      Assessment & Plan:   Problem List Items Addressed This Visit    Diabetes mellitus type 2, uncontrolled, with complications ([place]) - Primary    New diagnosis by cardiology last month. Discussed pathophysiology of type 2 diabetes Discussed goal sugars. rec he check with insurance about preferred glucose meter brand  Discussed low carb diet.    Start metformin 500mg  bid. Will refer for diabetes education classes at [facility] (close to home).  RTC 2 mo CPE and recheck labs at that time. Pt agrees.      Relevant Medications   losartan (COZAAR) 25 MG tablet   metFORMIN (GLUCOPHAGE) 500 MG tablet   Other Relevant Orders   Ambulatory referral to diabetic education   Essential hypertension    Endorses dry cough to lisinopril - will start losartan 25mg  daily.       Relevant Medications   losartan (COZAAR) 25 MG tablet   Severe obesity (BMI 35.0-39.9) with comorbidity ([place])    Discussed healthy diet and lifestyle changes to affect sustainable weight loss.       Relevant Medications   metFORMIN (GLUCOPHAGE) 500 MG tablet       Follow up plan: Return in about 2 months (around 09/08/2016) for annual exam, prior fasting for blood work, medicare wellness visit.  Ria Bush, MD

## 2016-07-09 NOTE — Assessment & Plan Note (Signed)
Endorses dry cough to lisinopril - will start losartan 25mg  daily.

## 2016-07-09 NOTE — Assessment & Plan Note (Addendum)
New diagnosis by cardiology last month. Discussed pathophysiology of type 2 diabetes Discussed goal sugars. rec he check with insurance about preferred glucose meter brand  Discussed low carb diet.  Start metformin 500mg  bid. Will refer for diabetes education classes at [facility] (close to home).  RTC 2 mo CPE and recheck labs at that time. Pt agrees.

## 2016-07-10 MED ORDER — ONETOUCH ULTRA SYSTEM W/DEVICE KIT: 1.0000 | PACK | Freq: Once | 0 refills | Status: DC

## 2016-07-10 MED ORDER — ONETOUCH LANCETS MISC: 3 refills | Status: DC

## 2016-07-10 MED ORDER — GLUCOSE BLOOD VI STRP: ORAL_STRIP | 3 refills | Status: DC

## 2016-07-12 NOTE — Telephone Encounter (Signed)
never received request from pharmacy - can we call to see what was the issue?

## 2016-08-18 ENCOUNTER — Ambulatory Visit (INDEPENDENT_AMBULATORY_CARE_PROVIDER_SITE_OTHER): Payer: Medicare Other | Admitting: Internal Medicine

## 2016-08-18 ENCOUNTER — Encounter: Payer: Self-pay | Admitting: Internal Medicine

## 2016-08-18 VITALS — BP 134/64 | HR 79 | Ht 75.0 in | Wt 327.5 lb

## 2016-08-18 DIAGNOSIS — I10 Essential (primary) hypertension: Secondary | ICD-10-CM | POA: Diagnosis not present

## 2016-08-18 DIAGNOSIS — I77810 Thoracic aortic ectasia: Secondary | ICD-10-CM

## 2016-08-18 DIAGNOSIS — I2510 Atherosclerotic heart disease of native coronary artery without angina pectoris: Secondary | ICD-10-CM | POA: Diagnosis not present

## 2016-08-18 DIAGNOSIS — I2584 Coronary atherosclerosis due to calcified coronary lesion: Secondary | ICD-10-CM

## 2016-08-18 MED ORDER — LOSARTAN POTASSIUM 50 MG PO TABS: 50.0000 mg | ORAL_TABLET | Freq: Every day | ORAL | 3 refills | Status: DC

## 2016-08-18 NOTE — Progress Notes (Signed)
Follow-up Outpatient Visit Date: 08/18/2016  Primary Care Provider: Ria Bush, MD [place] [address]  Chief Complaint: Follow-up coronary atherosclerosis and family history of CAD  HPI:  Mr. Mayorquin is a 66 y.o. year-old male with history of coronary artery calcification, recently diagnosed type 2 diabetes mellitus, hypertension, morbid obesity, obstructive sleep apnea, and degenerative joint disease involving the knees, who presents for follow-up of coronary atherosclerosis. I first saw him on 05/19/16 for cardiovascular risk assessment. His sister had recently had an MI, prompting Mr. Ganser to seek further evaluation. Labs revealed an elevated hemoglobin A1c consistent with diabetes mellitus. Subsequent cardiac CT showed a moderate-risk coronary calcium score. Mildly dilated thoracic aorta was also identified. Echocardiogram showed preserved biventricular function.  Today, Mr. Ruffolo reports that he has been feeling well. He denies chest pain, shortness of breath, palpitations, lightheadedness, and edema. He was started on metformin by Dr. [name] and reports that his average blood sugars have been well controlled, around 125. He is not checking his blood pressure regularly. At her prior visit, we added lisinopril for blood pressure control. However, this was switched to losartan due to a nonproductive cough with the ACE inhibitor. He is trying to exercise some, though his mobility is limited by chronic bilateral knee pain.  --------------------------------------------------------------------------------------------------  Cardiovascular History & Procedures: Cardiovascular Problems:  Coronary artery calcification  Dilated thoracic aorta  Risk Factors:  Hypertension, diabetes mellitus, obesity, sedentary lifestyle, male gender, age greater than 46, and family history  Cath/PCI:  None  CV Surgery:  None  EP Procedures and  Devices:  None  Non-Invasive Evaluation(s):  TTE (06/29/16): Normal LV size with mild LVH. LVEF 55-60% with normal wall motion. Normal diastolic function. Mildly dilated aortic root, measuring 4.0 cm. Mild left atrial enlargement. Normal RV size and function. No significant valvular abnormalities.  Coronary calcium score (06/09/16): Calcification of all 3 coronary arteries noted within a score of 102 Agatston units (53rd percentile for age and sex matched cohort). A sending aorta measuring 3.6 cm in diameter. No significant extracardiac abnormalities in the visualized thorax.  Recent CV Pertinent Labs: Lab Results  Component Value Date   CHOL 150 06/27/2015   HDL 45.80 06/27/2015   LDLCALC 84 06/27/2015   TRIG 99.0 06/27/2015   CHOLHDL 3 06/27/2015   INR 1.04 07/19/2012   K 4.3 06/02/2016   BUN 25 (H) 06/02/2016   BUN 24 05/19/2016   CREATININE 0.96 06/02/2016    Past medical and surgical history were reviewed and updated in EPIC.  Current Meds  Medication Sig  . aspirin EC 81 MG tablet Take 81 mg by mouth daily.  . DiphenhydrAMINE HCl, Sleep, (UNISOM SLEEPMELTS) 25 MG TBDP Take 25 mg by mouth at bedtime as needed.  Marland Kitchen glucose blood (ONE TOUCH ULTRA TEST) test strip Use as directed to check sugars twice daily and as needed E11.8  . Krill Oil 300 MG CAPS Take 300 mg by mouth daily.  Marland Kitchen losartan (COZAAR) 25 MG tablet Take 1 tablet (25 mg total) by mouth daily.  . metFORMIN (GLUCOPHAGE) 500 MG tablet Take 1 tablet (500 mg total) by mouth 2 (two) times daily with a meal.  . Misc Natural Products (OSTEO BI-FLEX JOINT SHIELD PO) Take 1 capsule by mouth daily.   . Multiple Vitamin (MULTIVITAMIN) tablet Take 1 tablet by mouth daily.  . ONE TOUCH LANCETS MISC Use as directed to check sugars twice daily and as needed E11.8  . sildenafil (VIAGRA) 100 MG  tablet Take 0.5-1 tablets (50-100 mg total) by mouth daily as needed for erectile dysfunction.    Allergies: Patient has no known  allergies.  Social History   Social History  . Marital status: Married    Spouse name: N/A  . Number of children: 1  . Years of education: N/A   Occupational History  . Self-employed    Social History Main Topics  . Smoking status: Never Smoker  . Smokeless tobacco: Never Used  . Alcohol use No  . Drug use: No  . Sexual activity: Not on file   Other Topics Concern  . Not on file   Social History Narrative   Caffeine: 2-3 diet sodas/day   Lives with wife   1 grown son.   Occupation: Self-employed; Psychologist, forensic   Activity: some golf, limited by knee pain   Diet: good water, daily vegetables    Family History  Problem Relation Age of Onset  . Ovarian cancer Mother   . Ulcers Mother        stomach  . Coronary artery disease Father 78       CABG X4  . Hypertension Father   . Stroke Father 97  . Heart attack Father 59  . Heart attack Sister 56  . Heart Problems Paternal Aunt   . Heart attack Paternal Uncle   . Heart attack Paternal Aunt   . Heart attack Paternal Uncle   . Diabetes Neg Hx   . Prostate cancer Neg Hx   . Breast cancer Neg Hx   . Colon cancer Neg Hx   . Depression Neg Hx   . Alcohol abuse Neg Hx     Review of Systems: A 12-system review of systems was performed and was negative except as noted in the HPI.  --------------------------------------------------------------------------------------------------  Physical Exam: BP 134/64 (BP Location: Left Arm, Patient Position: Sitting, Cuff Size: Large)   Pulse 79   Ht 6\' 3"  (1.905 m)   Wt (!) 327 lb 8 oz (148.6 kg)   BMI 40.93 kg/m   General:  Morbidly obese man, seated comfortably in the exam room. HEENT: No conjunctival pallor or scleral icterus. Moist mucous membranes.  OP clear. Neck: Supple without lymphadenopathy, thyromegaly, JVD, or HJR. No carotid bruit. Lungs: Normal work of breathing. Clear to auscultation bilaterally without wheezes or crackles. Heart:  Regular rate and rhythm without murmurs, rubs, or gallops. Unable to assess PMI due to body habitus. Abd: Bowel sounds present. Soft, NT/ND. Unable to assess HSM due to body habitus. Ext: Trace pretibial edema bilaterally. Radial, PT, and DP pulses are 2+ bilaterally. Skin: Warm and dry without rash.  Lab Results  Component Value Date   WBC 7.0 07/28/2012   HGB 13.0 07/28/2012   HCT 37.6 (L) 07/28/2012   MCV 91.5 07/28/2012   PLT 186 07/28/2012    Lab Results  Component Value Date   NA 135 06/02/2016   K 4.3 06/02/2016   CL 104 06/02/2016   CO2 25 06/02/2016   BUN 25 (H) 06/02/2016   CREATININE 0.96 06/02/2016   GLUCOSE 179 (H) 06/02/2016   ALT 46 07/19/2012    Lab Results  Component Value Date   CHOL 150 06/27/2015   HDL 45.80 06/27/2015   LDLCALC 84 06/27/2015   TRIG 99.0 06/27/2015   CHOLHDL 3 06/27/2015    --------------------------------------------------------------------------------------------------  ASSESSMENT AND PLAN: Coronary artery calcification Cardiac calcium score performed for further risk assessment. This revealed an intermediate risk score with calcification in  all 3 coronary arteries. Mr. Haubner does not have any symptoms of myocardial ischemia, though his exercise capacity is limited due to bilateral knee pain. We have discussed the risks and benefits of objective ischemia evaluation and have agreed to try an exercise tolerance test. If his knee pain precludes him from achieving target heart rate, we will consider a pharmacologic MPI in the future. For the time being, we will continue with primary prevention and risk factor modification. Mr. Housand should remain on aspirin. We also discussed statin therapy, given his coronary artery calcification and recently diagnosed diabetes. His goal LDL should be less than 70. He wishes to discuss this with his primary care biter, whom he is seeing next month. I encouraged Mr. Cham to exercise and lose  weight.  Essential hypertension Blood pressure is mildly elevated today (goal less than 130/80). Mr. Fabela is tolerating the start and well with resolution of his cough that occurred with lisinopril. We have agreed to increase losartan to 50 mg daily. Mr. Mcglocklin will have repeat lab work to monitor his renal function and potassium the next 1-2 weeks by Dr. [name].  Dilated aortic root Echocardiogram made note of mildly dilated aortic root, measuring 4.0 cm. However, cardiac CTA showed upper normal size of aortic root. No further evaluation is needed at this time. I stressed the importance of blood pressure and lipid control.  Follow-up: Return to clinic in 6 months.  Nelva Bush, MD 08/18/2016 9:07 PM

## 2016-08-18 NOTE — Patient Instructions (Signed)
Medication Instructions:  Your physician has recommended you make the following change in your medication:  1- INCREASE Losartan to 50 mg by mouth once a day.   Labwork: none  Testing/Procedures: Your physician has requested that you have an exercise tolerance test. For further information please visit [email or web address]. Please also follow instruction sheet, as given.    Follow-Up: Your physician wants you to follow-up in: 6 MONTHS WITH DR END. You will receive a reminder letter in the mail two months in advance. If you don't receive a letter, please call our office to schedule the follow-up appointment.  If you need a refill on your cardiac medications before your next appointment, please call your pharmacy.    Exercise Stress Electrocardiogram An exercise stress electrocardiogram is a test to check how blood flows to your heart. It is done to find areas of poor blood flow. You will need to walk on a treadmill for this test. The electrocardiogram will record your heartbeat when you are at rest and when you are exercising. What happens before the procedure?  Do not have drinks with caffeine or foods with caffeine for 24 hours before the test, or as told by your doctor. This includes coffee, tea (even decaf tea), sodas, chocolate, and cocoa.  Follow your doctor's instructions about eating and drinking before the test.  Ask your doctor what medicines you should or should not take before the test. Take your medicines with water unless told by your doctor not to.  If you use an inhaler, bring it with you to the test.  Bring a snack to eat after the test.  Do not  smoke for 4 hours before the test.  Do not put lotions, powders, creams, or oils on your chest before the test.  Wear comfortable shoes and clothing. What happens during the procedure?  You will have patches put on your chest. Small areas of your chest may need to be shaved. Wires will be connected to the  patches.  Your heart rate will be watched while you are resting and while you are exercising.  You will walk on the treadmill. The treadmill will slowly get faster to raise your heart rate.  The test will take about 1-2 hours. What happens after the procedure?  Your heart rate and blood pressure will be watched after the test.  You may return to your normal diet, activities, and medicines or as told by your doctor. This information is not intended to replace advice given to you by your health care provider. Make sure you discuss any questions you have with your health care provider. Document Released: 07/28/2007 Document Revised: 10/08/2015 Document Reviewed: 10/16/2012 Elsevier Interactive Patient Education  [employer].

## 2016-08-23 ENCOUNTER — Ambulatory Visit (INDEPENDENT_AMBULATORY_CARE_PROVIDER_SITE_OTHER): Payer: Medicare Other

## 2016-08-23 DIAGNOSIS — I2584 Coronary atherosclerosis due to calcified coronary lesion: Secondary | ICD-10-CM | POA: Diagnosis not present

## 2016-08-23 DIAGNOSIS — I2510 Atherosclerotic heart disease of native coronary artery without angina pectoris: Secondary | ICD-10-CM | POA: Diagnosis not present

## 2016-08-24 LAB — EXERCISE TOLERANCE TEST
CHL CUP MPHR: 154 {beats}/min
CHL CUP RESTING HR STRESS: 101 {beats}/min
CSEPHR: 101 %
Estimated workload: 4.8 METS
Exercise duration (min): 3 min
Exercise duration (sec): 14 s
Peak HR: 157 {beats}/min

## 2016-08-24 NOTE — Progress Notes (Signed)
Subjective:   Dennis Ayers is a 66 y.o. male who presents for an Initial Medicare Annual Wellness Visit.  The Patient was informed that the wellness visit is to identify future health risk and educate and initiate measures that can reduce risk for increased disease through the lifespan.   Describes health as fair, good or great? "It's okay."  Review of Systems  No ROS.  Medicare Wellness Visit. Additional risk factors are reflected in the social history.  Cardiac Risk Factors include: advanced age (>82men, >10 women);dyslipidemia;hypertension;male gender;obesity (BMI >30kg/m2)    Objective:    Today's Vitals   09/03/16 0854  BP: (!) 146/78  Pulse: 81  SpO2: 97%  Weight: (!) 323 lb (146.5 kg)  Height: 6' 1.5" (1.867 m)   Body mass index is 42.04 kg/m.  Current Medications (verified) Outpatient Encounter Prescriptions as of 09/03/2016  Medication Sig  . amoxicillin-clavulanate (AUGMENTIN) 875-125 MG tablet Take 1 tablet by mouth 2 (two) times daily.  Marland Kitchen aspirin EC 81 MG tablet Take 81 mg by mouth daily.  . DiphenhydrAMINE HCl, Sleep, (UNISOM SLEEPMELTS) 25 MG TBDP Take 25 mg by mouth at bedtime as needed.  Marland Kitchen glucose blood (ONE TOUCH ULTRA TEST) test strip Use as directed to check sugars twice daily and as needed E11.8  . Krill Oil 300 MG CAPS Take 300 mg by mouth daily.  Marland Kitchen losartan (COZAAR) 50 MG tablet Take 1 tablet (50 mg total) by mouth daily.  . metFORMIN (GLUCOPHAGE) 500 MG tablet Take 1 tablet (500 mg total) by mouth 2 (two) times daily with a meal.  . Misc Natural Products (OSTEO BI-FLEX JOINT SHIELD PO) Take 1 capsule by mouth daily.   . Multiple Vitamin (MULTIVITAMIN) tablet Take 1 tablet by mouth daily.  . ONE TOUCH LANCETS MISC Use as directed to check sugars twice daily and as needed E11.8  . sildenafil (VIAGRA) 100 MG tablet Take 0.5-1 tablets (50-100 mg total) by mouth daily as needed for erectile dysfunction.   No facility-administered encounter  medications on file as of 09/03/2016.     Allergies (verified) Patient has no known allergies.   History: Past Medical History:  Diagnosis Date  . Coronary artery calcification   . Diabetes mellitus without complication ([place])   . DJD (degenerative joint disease) of knee    [place] steroid injection (01/2014)  . Hyperglycemia   . Hypertension   . Jaundice    with icterus, when very young, treated and resolved  . Knee pain   . Morbid obesity ([place])   . OSA (obstructive sleep apnea)    mild to mod, working on weight loss, no cpap needed per test  . Osteoarthritis of knee 07/23/2011   blat s/p L TKR ([place])  . Severe obesity (BMI >= 40) ([place]) 02/15/2008  . Viral hepatitis 8th grade   has had jaundice in past, unsure of cause ?Hep A   Past Surgical History:  Procedure Laterality Date  . CHOLECYSTECTOMY  1998  . COLONOSCOPY  08/20/2011   4 adenomatous polyps - rpt 3 yrs Gatha Mayer)  . COLONOSCOPY  07-22-15   mult TAs, severe diverticulosis, one large 2.5cm cecal polyp pending resection Carlean Purl)  . COLONOSCOPY N/A 09/10/2015   large TA, rpt 1 yr Gatha Mayer, MD)  . EYE SURGERY Right    laser  . TONSILLECTOMY    . TOTAL KNEE ARTHROPLASTY Left 07/25/2012   Surgeon: Johnny Bridge, MD  . 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 Nicki Reaper  [place]   Family History  Problem Relation Age of Onset  . Ovarian cancer Mother   . Ulcers Mother        stomach  . Coronary artery disease Father 66       CABG X4  . Hypertension Father   . Stroke Father 64  . Heart attack Father 18  . Heart attack Sister 24  . Heart Problems Paternal Aunt   . Heart attack Paternal Uncle   . Heart attack Paternal Aunt   . Heart attack Paternal Uncle   . Diabetes Neg Hx   . Prostate cancer Neg Hx   . Breast cancer Neg Hx   . Colon cancer Neg Hx   . Depression Neg Hx   . Alcohol abuse Neg Hx    Social History   Occupational History  . Self-employed    Social History Main Topics  . Smoking status:  Never Smoker  . Smokeless tobacco: Never Used  . Alcohol use No  . Drug use: No  . Sexual activity: Not on file   Tobacco Counseling Counseling given: Not Answered   Activities of Daily Living In your present state of health, do you have any difficulty performing the following activities: 09/03/2016  Hearing? N  Vision? N  Difficulty concentrating or making decisions? N  Walking or climbing stairs? N  Dressing or bathing? N  Doing errands, shopping? N  Preparing Food and eating ? N  Using the Toilet? N  In the past six months, have you accidently leaked urine? N  Do you have problems with loss of bowel control? N  Managing your Medications? N  Managing your Finances? N  Housekeeping or managing your Housekeeping? N  Some recent data might be hidden    Immunizations and Health Maintenance Immunization History  Administered Date(s) Administered  . Influenza Whole 11/23/2011  . Influenza, High Dose Seasonal PF 11/03/2014, 11/21/2015  . Pneumococcal Conjugate-13 07/10/2015  . Pneumococcal Polysaccharide-23 11/21/2015  . Td 03/23/2002  . Tdap 07/10/2015  . Zoster 07/23/2011   There are no preventive care reminders to display for this patient.  Patient Care Team: Ria Bush, MD as PCP - General (Family Medicine) Marchia Bond, MD as Consulting Physician (Orthopedic Surgery) End, Harrell Gave, MD as Consulting Physician (Cardiology) Gatha Mayer, MD as Consulting Physician (Gastroenterology) Macarthur Critchley, OD as Consulting Physician (Optometry)  Indicate any recent Medical Services you may have received from other than [facility] providers in the past year (date may be approximate).    Assessment:   This is a routine wellness examination for Dennis Ayers. Physical assessment deferred to PCP.  Hearing/Vision screen  Hearing Screening   125Hz  250Hz  500Hz  1000Hz  2000Hz  3000Hz  4000Hz  6000Hz  8000Hz   Right ear:   40 40 40  40    Left ear:   40 40 40  40    Vision Screening  Comments: Last eye exam April 2018 at [facility].  Dietary issues and exercise activities discussed: Current Exercise Habits: The patient does not participate in regular exercise at present, Exercise limited by: orthopedic condition(s) (R knee pain)  Goals    . Increase physical activity (pt-stated)          Starting 09/03/2016, I will continue to stay active working in the yard and try to increase walking as tolerated. I will also continue following a low carbohydrate diet in an effort to lose weight and control my blood sugar.      Depression Screen PHQ 2/9 Scores 09/03/2016 07/10/2015  PHQ - 2 Score 0 0    Fall Risk Fall Risk  09/03/2016 07/10/2015  Falls in the past year? No No    Cognitive Function: PLEASE NOTE: A Mini-Cog screen was completed. Maximum score is 20. A value of 0 denotes this part of [place] MMSE was not completed or the patient failed this part of the Mini-Cog screening.   Mini-Cog Screening Orientation to Time - Max 5 pts Orientation to Place - Max 5 pts Registration - Max 3 pts Recall - Max 3 pts Language Repeat - Max 1 pts Language Follow 3 Step Command - Max 3 pts      Mini-Cog - 09/03/16 0900    Normal clock drawing test? yes   How many words correct? 3      MMSE - Mini Mental State Exam 09/03/2016  Orientation to time 5  Orientation to Place 5  Registration 3  Attention/ Calculation 0  Recall 3  Language- name 2 objects 0  Language- repeat 1  Language- follow 3 step command 3  Language- read & follow direction 0  Write a sentence 0  Copy design 0  Total score 20        Screening Tests Health Maintenance  Topic Date Due  . COLONOSCOPY  09/09/2016  . INFLUENZA VACCINE  09/22/2016  . HEMOGLOBIN A1C  12/02/2016  . OPHTHALMOLOGY EXAM  06/22/2017  . FOOT EXAM  07/09/2017  . DTaP/Tdap/Td (2 - Td) 07/09/2025  . TETANUS/TDAP  07/09/2025  . Hepatitis C Screening  Completed  . PNA vac Low Risk Adult  Completed        Plan:     Follow-up w/ PCP as scheduled.  I have personally reviewed and noted the following in the patient's chart:   . Medical and social history . Use of alcohol, tobacco or illicit drugs  . Current medications and supplements . Functional ability and status . Nutritional status . Physical activity . Advanced directives . List of other physicians . Vitals . Screenings to include cognitive, depression, and falls . Referrals and appointments  In addition, I have reviewed and discussed with patient certain preventive protocols, quality metrics, and best practice recommendations. A written personalized care plan for preventive services as well as general preventive health recommendations were provided to patient.     Dorrene German, RN   09/03/2016

## 2016-08-24 NOTE — Progress Notes (Signed)
PCP notes:   Health maintenance: No gaps identified.   Abnormal screenings: None.   Patient concerns:  1) He would like rx for [employer] system. 2) He plans to get Hepatitis B and Shingrix vaccines at the pharmacy.   Nurse concerns: BP above goal, but he has not taken medications yet today.   Next PCP appt: 09/10/2016 @ 9:00am

## 2016-09-02 ENCOUNTER — Other Ambulatory Visit: Payer: Self-pay | Admitting: Family Medicine

## 2016-09-02 DIAGNOSIS — D751 Secondary polycythemia: Secondary | ICD-10-CM

## 2016-09-02 DIAGNOSIS — IMO0002 Reserved for concepts with insufficient information to code with codable children: Secondary | ICD-10-CM

## 2016-09-02 DIAGNOSIS — E118 Type 2 diabetes mellitus with unspecified complications: Principal | ICD-10-CM

## 2016-09-02 DIAGNOSIS — Z125 Encounter for screening for malignant neoplasm of prostate: Secondary | ICD-10-CM

## 2016-09-02 DIAGNOSIS — E1165 Type 2 diabetes mellitus with hyperglycemia: Secondary | ICD-10-CM

## 2016-09-03 ENCOUNTER — Ambulatory Visit (INDEPENDENT_AMBULATORY_CARE_PROVIDER_SITE_OTHER): Payer: Medicare Other

## 2016-09-03 ENCOUNTER — Other Ambulatory Visit (INDEPENDENT_AMBULATORY_CARE_PROVIDER_SITE_OTHER): Payer: Medicare Other

## 2016-09-03 VITALS — BP 146/78 | HR 81 | Ht 73.5 in | Wt 323.0 lb

## 2016-09-03 DIAGNOSIS — Z125 Encounter for screening for malignant neoplasm of prostate: Secondary | ICD-10-CM | POA: Diagnosis not present

## 2016-09-03 DIAGNOSIS — E1165 Type 2 diabetes mellitus with hyperglycemia: Secondary | ICD-10-CM | POA: Diagnosis not present

## 2016-09-03 DIAGNOSIS — E118 Type 2 diabetes mellitus with unspecified complications: Secondary | ICD-10-CM

## 2016-09-03 DIAGNOSIS — Z0000 Encounter for general adult medical examination without abnormal findings: Secondary | ICD-10-CM | POA: Diagnosis not present

## 2016-09-03 DIAGNOSIS — IMO0002 Reserved for concepts with insufficient information to code with codable children: Secondary | ICD-10-CM

## 2016-09-03 DIAGNOSIS — D751 Secondary polycythemia: Secondary | ICD-10-CM

## 2016-09-03 LAB — CBC WITH DIFFERENTIAL/PLATELET
BASOS ABS: 0 10*3/uL (ref 0.0–0.1)
Basophils Relative: 0.6 % (ref 0.0–3.0)
EOS ABS: 0.1 10*3/uL (ref 0.0–0.7)
Eosinophils Relative: 2.5 % (ref 0.0–5.0)
HEMATOCRIT: 47.6 % (ref 39.0–52.0)
HEMOGLOBIN: 16.8 g/dL (ref 13.0–17.0)
LYMPHS PCT: 33.8 % (ref 12.0–46.0)
Lymphs Abs: 1.7 10*3/uL (ref 0.7–4.0)
MCHC: 35.4 g/dL (ref 30.0–36.0)
MCV: 93 fl (ref 78.0–100.0)
Monocytes Absolute: 0.4 10*3/uL (ref 0.1–1.0)
Monocytes Relative: 8.6 % (ref 3.0–12.0)
NEUTROS ABS: 2.8 10*3/uL (ref 1.4–7.7)
Neutrophils Relative %: 54.5 % (ref 43.0–77.0)
PLATELETS: 254 10*3/uL (ref 150.0–400.0)
RBC: 5.12 Mil/uL (ref 4.22–5.81)
RDW: 13.2 % (ref 11.5–15.5)
WBC: 5 10*3/uL (ref 4.0–10.5)

## 2016-09-03 LAB — BASIC METABOLIC PANEL
BUN: 22 mg/dL (ref 6–23)
CALCIUM: 9.5 mg/dL (ref 8.4–10.5)
CO2: 25 meq/L (ref 19–32)
CREATININE: 0.93 mg/dL (ref 0.40–1.50)
Chloride: 104 mEq/L (ref 96–112)
GFR: 86.18 mL/min (ref >60.00)
GLUCOSE: 138 mg/dL — AB (ref 70–99)
POTASSIUM: 4.2 meq/L (ref 3.5–5.1)
SODIUM: 139 meq/L (ref 135–145)

## 2016-09-03 LAB — LIPID PANEL
Cholesterol: 159 mg/dL (ref 0–200)
HDL: 49.9 mg/dL (ref >39.00)
LDL Cholesterol: 96 mg/dL (ref 0–99)
NONHDL: 109.53
Total CHOL/HDL Ratio: 3
Triglycerides: 68 mg/dL (ref 0.0–149.0)
VLDL: 13.6 mg/dL (ref 0.0–40.0)

## 2016-09-03 LAB — PSA, MEDICARE: PSA: 1.87 ng/ml (ref 0.10–4.00)

## 2016-09-03 LAB — HEMOGLOBIN A1C: Hgb A1c MFr Bld: 6.1 % (ref 4.6–6.5)

## 2016-09-03 NOTE — Patient Instructions (Signed)
Dennis Ayers , Thank you for taking time to come for your Medicare Wellness Visit. I appreciate your ongoing commitment to your health goals. Please review the following plan we discussed and let me know if I can assist you in the future.   These are the goals we discussed: Goals    . Increase physical activity (pt-stated)          Starting 09/03/2016, I will continue to stay active working in the yard and try to increase walking as tolerated. I will also continue following a low carbohydrate diet in an effort to lose weight and control my blood sugar.       This is a list of the screening recommended for you and due dates:  Health Maintenance  Topic Date Due  . Colon Cancer Screening  09/09/2016  . Flu Shot  09/22/2016  . Hemoglobin A1C  12/02/2016  . Eye exam for diabetics  06/22/2017  . Complete foot exam   07/09/2017  . DTaP/Tdap/Td vaccine (2 - Td) 07/09/2025  . Tetanus Vaccine  07/09/2025  .  Hepatitis C: One time screening is recommended by Center for Disease Control  (CDC) for  adults born from [birth year] through 1966.   Completed  . Pneumonia vaccines  Completed    Preventive Care for Adults  A healthy lifestyle and preventive care can promote health and wellness. Preventive health guidelines for adults include the following key practices.  . A routine yearly physical is a good way to check with your health care provider about your health and preventive screening. It is a chance to share any concerns and updates on your health and to receive a thorough exam.  . Visit your dentist for a routine exam and preventive care every 6 months. Brush your teeth twice a day and floss once a day. Good oral hygiene prevents tooth decay and gum disease.  . The frequency of eye exams is based on your age, health, family medical history, use  of contact lenses, and other factors. Follow your health care provider's ecommendations for frequency of eye exams.  . Eat a healthy diet. Foods like  vegetables, fruits, whole grains, low-fat dairy products, and lean protein foods contain the nutrients you need without too many calories. Decrease your intake of foods high in solid fats, added sugars, and salt. Eat the right amount of calories for you. Get information about a proper diet from your health care provider, if necessary.  . Regular physical exercise is one of the most important things you can do for your health. Most adults should get at least 150 minutes of moderate-intensity exercise (any activity that increases your heart rate and causes you to sweat) each week. In addition, most adults need muscle-strengthening exercises on 2 or more days a week.  Silver Sneakers may be a benefit available to you. To determine eligibility, you may visit the website: www.silversneakers.com or contact program at 604-345-1458 Mon-Fri between 8AM-8PM.   . Maintain a healthy weight. The body mass index (BMI) is a screening tool to identify possible weight problems. It provides an estimate of body fat based on height and weight. Your health care provider can find your BMI and can help you achieve or maintain a healthy weight.   For adults 20 years and older: ? A BMI below 18.5 is considered underweight. ? A BMI of 18.5 to 24.9 is normal. ? A BMI of 25 to 29.9 is considered overweight. ? A BMI of 30 and above is considered  obese.   . Maintain normal blood lipids and cholesterol levels by exercising and minimizing your intake of saturated fat. Eat a balanced diet with plenty of fruit and vegetables. Blood tests for lipids and cholesterol should begin at age 40 and be repeated every 5 years. If your lipid or cholesterol levels are high, you are over 50, or you are at high risk for heart disease, you may need your cholesterol levels checked more frequently. Ongoing high lipid and cholesterol levels should be treated with medicines if diet and exercise are not working.  . If you smoke, find out from your  health care provider how to quit. If you do not use tobacco, please do not start.  . If you choose to drink alcohol, please do not consume more than 2 drinks per day. One drink is considered to be 12 ounces (355 mL) of beer, 5 ounces (148 mL) of wine, or 1.5 ounces (44 mL) of liquor.  . If you are 69-54 years old, ask your health care provider if you should take aspirin to prevent strokes.  . Use sunscreen. Apply sunscreen liberally and repeatedly throughout the day. You should seek shade when your shadow is shorter than you. Protect yourself by wearing long sleeves, pants, a wide-brimmed hat, and sunglasses year round, whenever you are outdoors.  . Once a month, do a whole body skin exam, using a mirror to look at the skin on your back. Tell your health care provider of new moles, moles that have irregular borders, moles that are larger than a pencil eraser, or moles that have changed in shape or color.

## 2016-09-04 NOTE — Progress Notes (Signed)
I reviewed health advisor's note, was available for consultation, and agree with documentation and plan.  

## 2016-09-10 ENCOUNTER — Ambulatory Visit (INDEPENDENT_AMBULATORY_CARE_PROVIDER_SITE_OTHER): Payer: Medicare Other | Admitting: Family Medicine

## 2016-09-10 ENCOUNTER — Encounter: Payer: Self-pay | Admitting: Internal Medicine

## 2016-09-10 ENCOUNTER — Telehealth: Payer: Self-pay | Admitting: Internal Medicine

## 2016-09-10 ENCOUNTER — Other Ambulatory Visit: Payer: Self-pay

## 2016-09-10 ENCOUNTER — Encounter: Payer: Self-pay | Admitting: Family Medicine

## 2016-09-10 VITALS — BP 138/82 | HR 74 | Temp 97.6°F | Wt 320.0 lb

## 2016-09-10 DIAGNOSIS — I10 Essential (primary) hypertension: Secondary | ICD-10-CM | POA: Diagnosis not present

## 2016-09-10 DIAGNOSIS — E1165 Type 2 diabetes mellitus with hyperglycemia: Secondary | ICD-10-CM

## 2016-09-10 DIAGNOSIS — E118 Type 2 diabetes mellitus with unspecified complications: Secondary | ICD-10-CM

## 2016-09-10 DIAGNOSIS — G4733 Obstructive sleep apnea (adult) (pediatric): Secondary | ICD-10-CM | POA: Diagnosis not present

## 2016-09-10 DIAGNOSIS — Z860100 Personal history of colon polyps, unspecified: Secondary | ICD-10-CM

## 2016-09-10 DIAGNOSIS — Z7189 Other specified counseling: Secondary | ICD-10-CM | POA: Diagnosis not present

## 2016-09-10 DIAGNOSIS — IMO0002 Reserved for concepts with insufficient information to code with codable children: Secondary | ICD-10-CM

## 2016-09-10 DIAGNOSIS — Z0000 Encounter for general adult medical examination without abnormal findings: Secondary | ICD-10-CM | POA: Insufficient documentation

## 2016-09-10 MED ORDER — FREESTYLE LIBRE SENSOR SYSTEM MISC: 0 refills | Status: DC

## 2016-09-10 NOTE — Telephone Encounter (Signed)
Does he need to be done at the hospital the last 2 you used [facility]?

## 2016-09-10 NOTE — Telephone Encounter (Signed)
Thank you for asking. I do think it would be best to be done at the hospital and we have him done with general anesthesia to avoid respiratory motion problems in the colon

## 2016-09-10 NOTE — Assessment & Plan Note (Signed)
Preventative protocols reviewed and updated unless pt declined. Discussed healthy diet and lifestyle.  

## 2016-09-10 NOTE — Assessment & Plan Note (Signed)
Advanced directive discussion - has at home. HCPOA is wife and son. No prolonged life support if terminal illness. Copy in chart 08/2015

## 2016-09-10 NOTE — Assessment & Plan Note (Signed)
Pt planning to call to reschedule colonoscopy.

## 2016-09-10 NOTE — Assessment & Plan Note (Addendum)
Congratulated on significant improvement with healthy diet changes. [place] education. He is interested in continuous glucose monitoring and requests Freestyle Libre glucometer system. He will attend information session on continuous glucose monitoring at [facility].

## 2016-09-10 NOTE — Assessment & Plan Note (Signed)
Chronic, stable. Continue current regimen. 

## 2016-09-10 NOTE — Telephone Encounter (Signed)
Patient notified and will move to [facility] .  Patient notified tentatively scheduled for 10/29/16 8:30.  He will come for a pre-visit 10/15/16.  He is notified that could change and I will let him know on Monday when central scheduling is back.

## 2016-09-10 NOTE — Patient Instructions (Addendum)
You are doing well today. Congratulations on good sugar control! Keep working on weight.  Return as needed or in 6 months for follow up visit.   Health Maintenance, Male A healthy lifestyle and preventive care is important for your health and wellness. Ask your health care provider about what schedule of regular examinations is right for you. What should I know about weight and diet? Eat a Healthy Diet  Eat plenty of vegetables, fruits, whole grains, low-fat dairy products, and lean protein.  Do not eat a lot of foods high in solid fats, added sugars, or salt.  Maintain a Healthy Weight Regular exercise can help you achieve or maintain a healthy weight. You should:  Do at least 150 minutes of exercise each week. The exercise should increase your heart rate and make you sweat (moderate-intensity exercise).  Do strength-training exercises at least twice a week.  Watch Your Levels of Cholesterol and Blood Lipids  Have your blood tested for lipids and cholesterol every 5 years starting at 67 years of age. If you are at high risk for heart disease, you should start having your blood tested when you are 67 years old. You may need to have your cholesterol levels checked more often if: ? Your lipid or cholesterol levels are high. ? You are older than 67 years of age. ? You are at high risk for heart disease.  What should I know about cancer screening? Many types of cancers can be detected early and may often be prevented. Lung Cancer  You should be screened every year for lung cancer if: ? You are a current smoker who has smoked for at least 30 years. ? You are a former smoker who has quit within the past 15 years.  Talk to your health care provider about your screening options, when you should start screening, and how often you should be screened.  Colorectal Cancer  Routine colorectal cancer screening usually begins at 67 years of age and should be repeated every 5-10 years until  you are 67 years old. You may need to be screened more often if early forms of precancerous polyps or small growths are found. Your health care provider may recommend screening at an earlier age if you have risk factors for colon cancer.  Your health care provider may recommend using home test kits to check for hidden blood in the stool.  A small camera at the end of a tube can be used to examine your colon (sigmoidoscopy or colonoscopy). This checks for the earliest forms of colorectal cancer.  Prostate and Testicular Cancer  Depending on your age and overall health, your health care provider may do certain tests to screen for prostate and testicular cancer.  Talk to your health care provider about any symptoms or concerns you have about testicular or prostate cancer.  Skin Cancer  Check your skin from head to toe regularly.  Tell your health care provider about any new moles or changes in moles, especially if: ? There is a change in a mole's size, shape, or color. ? You have a mole that is larger than a pencil eraser.  Always use sunscreen. Apply sunscreen liberally and repeat throughout the day.  Protect yourself by wearing long sleeves, pants, a wide-brimmed hat, and sunglasses when outside.  What should I know about heart disease, diabetes, and high blood pressure?  If you are 71-3 years of age, have your blood pressure checked every 3-5 years. If you are 54 years of age  or older, have your blood pressure checked every year. You should have your blood pressure measured twice-once when you are at a hospital or clinic, and once when you are not at a hospital or clinic. Record the average of the two measurements. To check your blood pressure when you are not at a hospital or clinic, you can use: ? An automated blood pressure machine at a pharmacy. ? A home blood pressure monitor.  Talk to your health care provider about your target blood pressure.  If you are between 14-79 years  old, ask your health care provider if you should take aspirin to prevent heart disease.  Have regular diabetes screenings by checking your fasting blood sugar level. ? If you are at a normal weight and have a low risk for diabetes, have this test once every three years after the age of 9. ? If you are overweight and have a high risk for diabetes, consider being tested at a younger age or more often.  A one-time screening for abdominal aortic aneurysm (AAA) by ultrasound is recommended for men aged 75-75 years who are current or former smokers. What should I know about preventing infection? Hepatitis B If you have a higher risk for hepatitis B, you should be screened for this virus. Talk with your health care provider to find out if you are at risk for hepatitis B infection. Hepatitis C Blood testing is recommended for:  Everyone born from [birth year] through 1966.  Anyone with known risk factors for hepatitis C.  Sexually Transmitted Diseases (STDs)  You should be screened each year for STDs including gonorrhea and chlamydia if: ? You are sexually active and are younger than 67 years of age. ? You are older than 67 years of age and your health care provider tells you that you are at risk for this type of infection. ? Your sexual activity has changed since you were last screened and you are at an increased risk for chlamydia or gonorrhea. Ask your health care provider if you are at risk.  Talk with your health care provider about whether you are at high risk of being infected with HIV. Your health care provider may recommend a prescription medicine to help prevent HIV infection.  What else can I do?  Schedule regular health, dental, and eye exams.  Stay current with your vaccines (immunizations).  Do not use any tobacco products, such as cigarettes, chewing tobacco, and e-cigarettes. If you need help quitting, ask your health care provider.  Limit alcohol intake to no more than 2 drinks  per day. One drink equals 12 ounces of beer, 5 ounces of wine, or 1 ounces of hard liquor.  Do not use street drugs.  Do not share needles.  Ask your health care provider for help if you need support or information about quitting drugs.  Tell your health care provider if you often feel depressed.  Tell your health care provider if you have ever been abused or do not feel safe at home. This information is not intended to replace advice given to you by your health care provider. Make sure you discuss any questions you have with your health care provider. Document Released: 08/07/2007 Document Revised: 10/08/2015 Document Reviewed: 11/12/2014 Elsevier Interactive Patient Education  [employer].

## 2016-09-10 NOTE — Assessment & Plan Note (Signed)
Not interested in CPAP

## 2016-09-10 NOTE — Assessment & Plan Note (Signed)
Discussed healthy diet and lifestyle changes to affect sustainable weight loss  

## 2016-09-10 NOTE — Progress Notes (Addendum)
BP 138/82   Pulse 74   Temp 97.6 F (36.4 C) (Oral)   Wt (!) 320 lb (145.2 kg)   SpO2 97%   BMI 41.65 kg/m    CC: CPE Subjective:    Patient ID: Dennis Ayers, male    DOB: 08/16/1950, 66 y.o.   MRN: [id number]  HPI: Dennis Ayers is a 66 y.o. male presenting on 09/10/2016 for Follow-up   Recent new dx diabetes - started on metformin 06/2016. We referred to [facility] diabetes classes - he has attended. He checks sugars. Last 7d fasting average 117, also checks prior to dinner.   Recent abscessed tooth s/p root canal, taking augmentin for this.   Saw Hoyle Sauer last week for medicare wellness visit. Note reviewed.  1) He would like rx for [employer] system. 2) He plans to get Hepatitis B and Shingrix vaccines at the pharmacy.   Preventative: COLONOSCOPY Date: 06/2015 mult TAs, severe diverticulosis, 1 large 2.5cm cecal polyp pending resection Carlean Purl) - 08/2015 large TA, rpt 1 yr Carlean Purl) Prostate cancer screening - discussed. Will screen.  Lung cancer screening - never smoker  AAA screen - never smoker, no fmhx AAA  Flu shot - yearly Td 2004, Tdap 06/2015 prevnar 06/2015, pneumovax 10/2015 Zostavax - 2013 Shingrix - pt to get at pharmacy. Hep B - pt to get at pharmacy.  Advanced directive discussion - has at home. HCPOA is wife and son. No prolonged life support if terminal illness. Copy in chart 08/2015 Seat belt use discussed  Sunscreen use and skin screen discussed, no changing moles on skin. Non smoker Alcohol - none  Caffeine: 2-3 diet sodas/day Lives with wife 1 grown son. Occupation: Self-employed; Psychologist, forensic Activity: some golf, limited by knee pain  Diet: good water, daily vegetables   Relevant past medical, surgical, family and social history reviewed and updated as indicated. Interim medical history since our last visit reviewed. Allergies and medications reviewed and updated. Outpatient Medications Prior to  Visit  Medication Sig Dispense Refill  . amoxicillin-clavulanate (AUGMENTIN) 875-125 MG tablet Take 1 tablet by mouth 2 (two) times daily.    Marland Kitchen aspirin EC 81 MG tablet Take 81 mg by mouth daily.    . DiphenhydrAMINE HCl, Sleep, (UNISOM SLEEPMELTS) 25 MG TBDP Take 25 mg by mouth at bedtime as needed.    Marland Kitchen glucose blood (ONE TOUCH ULTRA TEST) test strip Use as directed to check sugars twice daily and as needed E11.8 100 each 3  . Krill Oil 300 MG CAPS Take 300 mg by mouth daily.    Marland Kitchen losartan (COZAAR) 50 MG tablet Take 1 tablet (50 mg total) by mouth daily. 90 tablet 3  . metFORMIN (GLUCOPHAGE) 500 MG tablet Take 1 tablet (500 mg total) by mouth 2 (two) times daily with a meal. 180 tablet 3  . Misc Natural Products (OSTEO BI-FLEX JOINT SHIELD PO) Take 1 capsule by mouth daily.     . Multiple Vitamin (MULTIVITAMIN) tablet Take 1 tablet by mouth daily.    . ONE TOUCH LANCETS MISC Use as directed to check sugars twice daily and as needed E11.8 100 each 3  . sildenafil (VIAGRA) 100 MG tablet Take 0.5-1 tablets (50-100 mg total) by mouth daily as needed for erectile dysfunction. 9 tablet 6   No facility-administered medications prior to visit.      Per HPI unless specifically indicated in ROS section below Review of Systems  Constitutional: Negative for activity change, appetite change, chills,  fatigue, fever and unexpected weight change.  HENT: Negative for hearing loss.   Eyes: Negative for visual disturbance.  Respiratory: Negative for cough, chest tightness, shortness of breath and wheezing.   Cardiovascular: Negative for chest pain, palpitations and leg swelling.  Gastrointestinal: Negative for abdominal distention, abdominal pain, blood in stool, constipation, diarrhea, nausea and vomiting.  Genitourinary: Negative for difficulty urinating and hematuria.  Musculoskeletal: Negative for arthralgias, myalgias and neck pain.  Skin: Negative for rash.  Neurological: Negative for dizziness,  seizures, syncope and headaches.  Hematological: Negative for adenopathy. Does not bruise/bleed easily.  Psychiatric/Behavioral: Negative for dysphoric mood. The patient is not nervous/anxious.        Objective:    BP 138/82   Pulse 74   Temp 97.6 F (36.4 C) (Oral)   Wt (!) 320 lb (145.2 kg)   SpO2 97%   BMI 41.65 kg/m   Wt Readings from Last 3 Encounters:  09/10/16 (!) 320 lb (145.2 kg)  09/03/16 (!) 323 lb (146.5 kg)  08/18/16 (!) 327 lb 8 oz (148.6 kg)    Physical Exam  Constitutional: He is oriented to person, place, and time. He appears well-developed and well-nourished. No distress.  HENT:  Head: Normocephalic and atraumatic.  Right Ear: Hearing, tympanic membrane, external ear and ear canal normal.  Left Ear: Hearing, tympanic membrane, external ear and ear canal normal.  Nose: Nose normal.  Mouth/Throat: Uvula is midline, oropharynx is clear and moist and mucous membranes are normal. No oropharyngeal exudate, posterior oropharyngeal edema or posterior oropharyngeal erythema.  Eyes: Pupils are equal, round, and reactive to light. Conjunctivae and EOM are normal. No scleral icterus.  Neck: Normal range of motion. Neck supple. No thyromegaly present.  Cardiovascular: Normal rate, regular rhythm, normal heart sounds and intact distal pulses.   No murmur heard. Pulses:      Radial pulses are 2+ on the right side, and 2+ on the left side.  Pulmonary/Chest: Effort normal and breath sounds normal. No respiratory distress. He has no wheezes. He has no rales.  Abdominal: Soft. Bowel sounds are normal. He exhibits no distension and no mass. There is no tenderness. There is no rebound and no guarding.  Genitourinary: Rectum normal. Rectal exam shows no external hemorrhoid, no internal hemorrhoid, no fissure, no mass, no tenderness and anal tone normal. Prostate is enlarged (mild ~20gm). Prostate is not tender.  Musculoskeletal: Normal range of motion. He exhibits no edema.    Lymphadenopathy:    He has no cervical adenopathy.  Neurological: He is alert and oriented to person, place, and time.  CN grossly intact, station and gait intact  Skin: Skin is warm and dry. No rash noted.  Psychiatric: He has a normal mood and affect. His behavior is normal. Judgment and thought content normal.  Nursing note and vitals reviewed.  Results for orders placed or performed in visit on 09/03/16  Lipid panel  Result Value Ref Range   Cholesterol 159 0 - 200 mg/dL   Triglycerides 68.0 0.0 - 149.0 mg/dL   HDL 49.90 >39.00 mg/dL   VLDL 13.6 0.0 - 40.0 mg/dL   LDL Cholesterol 96 0 - 99 mg/dL   Total CHOL/HDL Ratio 3    NonHDL 109.53   Hemoglobin A1c  Result Value Ref Range   Hgb A1c MFr Bld 6.1 4.6 - 6.5 %  PSA, Medicare  Result Value Ref Range   PSA 1.87 0.10 - 4.00 ng/ml  Basic metabolic panel  Result Value Ref Range  Sodium 139 135 - 145 mEq/L   Potassium 4.2 3.5 - 5.1 mEq/L   Chloride 104 96 - 112 mEq/L   CO2 25 19 - 32 mEq/L   Glucose, Bld 138 (H) 70 - 99 mg/dL   BUN 22 6 - 23 mg/dL   Creatinine, Ser 0.93 0.40 - 1.50 mg/dL   Calcium 9.5 8.4 - 10.5 mg/dL   GFR 86.18 >60.00 mL/min  CBC with Differential/Platelet  Result Value Ref Range   WBC 5.0 4.0 - 10.5 K/uL   RBC 5.12 4.22 - 5.81 Mil/uL   Hemoglobin 16.8 13.0 - 17.0 g/dL   HCT 47.6 39.0 - 52.0 %   MCV 93.0 78.0 - 100.0 fl   MCHC 35.4 30.0 - 36.0 g/dL   RDW 13.2 11.5 - 15.5 %   Platelets 254.0 150.0 - 400.0 K/uL   Neutrophils Relative % 54.5 43.0 - 77.0 %   Lymphocytes Relative 33.8 12.0 - 46.0 %   Monocytes Relative 8.6 3.0 - 12.0 %   Eosinophils Relative 2.5 0.0 - 5.0 %   Basophils Relative 0.6 0.0 - 3.0 %   Neutro Abs 2.8 1.4 - 7.7 K/uL   Lymphs Abs 1.7 0.7 - 4.0 K/uL   Monocytes Absolute 0.4 0.1 - 1.0 K/uL   Eosinophils Absolute 0.1 0.0 - 0.7 K/uL   Basophils Absolute 0.0 0.0 - 0.1 K/uL      Assessment & Plan:   Problem List Items Addressed This Visit    Advanced care  planning/counseling discussion    Advanced directive discussion - has at home. HCPOA is wife and son. No prolonged life support if terminal illness. Copy in chart 08/2015      Diabetes mellitus type 2, uncontrolled, with complications ([place])    Congratulated on significant improvement with healthy diet changes. [place]. He is interested in continuous glucose monitoring and requests Freestyle Libre glucometer system. He will attend information session on continuous glucose monitoring at [facility].       Essential hypertension    Chronic, stable. Continue current regimen.       Health maintenance examination - Primary    Preventative protocols reviewed and updated unless pt declined. Discussed healthy diet and lifestyle.       Obesity, morbid, BMI 40.0-49.9 ([place])    Discussed healthy diet and lifestyle changes to affect sustainable weight loss.       OSA (obstructive sleep apnea)    Not interested in CPAP      Personal history of colonic polyps-serrated adenomas and tubular adenoma    Pt planning to call to reschedule colonoscopy.           Follow up plan: Return in about 6 months (around 03/13/2017) for follow up visit.  Ria Bush, MD

## 2016-09-13 NOTE — Telephone Encounter (Signed)
I confirmed appt with scheduling for 10/29/16 8:30

## 2016-09-28 ENCOUNTER — Telehealth: Payer: Self-pay

## 2016-09-28 MED ORDER — FREESTYLE LIBRE SENSOR SYSTEM MISC: 0 refills | Status: DC

## 2016-09-28 NOTE — Telephone Encounter (Signed)
Dollie with [facility] said the qty for freestyle libre sensor system needs to be # 3. Verbal given.

## 2016-10-01 ENCOUNTER — Other Ambulatory Visit: Payer: Self-pay

## 2016-10-01 ENCOUNTER — Other Ambulatory Visit: Payer: Self-pay | Admitting: Family Medicine

## 2016-10-01 MED ORDER — FREESTYLE LIBRE READER DEVI: 1.0000 | Freq: Two times a day (BID) | 0 refills | Status: DC

## 2016-10-01 MED ORDER — FREESTYLE LIBRE SENSOR SYSTEM MISC: 11 refills | Status: DC

## 2016-10-01 NOTE — Telephone Encounter (Signed)
Pt came to office because he is willing to pay out of pocket for freestyle libre meter and sensor but pt said [facility] does not have order. I spoke with Daylene Katayama and Marjory Lies at [employer] and left verbal order for meter or reader and sensor to have refills. [facility] will [facility] to [facility] up diabetic equipment. Gave pt the option to ck with ins to see what could be substituted and ins would approve but pt wants this particular meter or reader and sensor. Pt going to get reader and sensor now.

## 2016-10-15 ENCOUNTER — Ambulatory Visit (AMBULATORY_SURGERY_CENTER): Payer: Self-pay | Admitting: *Deleted

## 2016-10-15 VITALS — Ht 74.0 in | Wt 324.6 lb

## 2016-10-15 DIAGNOSIS — Z86010 Personal history of colonic polyps: Secondary | ICD-10-CM

## 2016-10-15 NOTE — Progress Notes (Signed)
No allergies to eggs or soy. No problems with anesthesia.  Pt given Emmi instructions for colonoscopy  No oxygen use  No diet drug use  

## 2016-10-21 ENCOUNTER — Encounter (HOSPITAL_COMMUNITY): Payer: Self-pay

## 2016-10-28 NOTE — Anesthesia Preprocedure Evaluation (Addendum)
Anesthesia Evaluation  Patient identified by MRN, date of birth, ID band Patient awake    Reviewed: Allergy & Precautions, H&P , NPO status , Patient's Chart, lab work & pertinent test results  Airway Mallampati: III  TM Distance: >3 FB Neck ROM: full    Dental  (+) Dental Advisory Given   Pulmonary sleep apnea ,    breath sounds clear to auscultation       Cardiovascular hypertension, Pt. on medications + CAD and + Peripheral Vascular Disease   Rhythm:Regular Rate:Normal  Stress test 08/2016 - No evidence of ischemia at 4.8 METS. Poor exercise capacity.  TTE 06/2016 - Normal EF. No diastolic dysfunction.   Neuro/Psych negative neurological ROS  negative psych ROS   GI/Hepatic (+) Hepatitis -  Endo/Other  diabetes, Oral Hypoglycemic AgentsMorbid obesity  Renal/GU   negative genitourinary   Musculoskeletal  (+) Arthritis , Osteoarthritis,    Abdominal   Peds  Hematology negative hematology ROS (+)   Anesthesia Other Findings   Reproductive/Obstetrics                          Anesthesia Physical  Anesthesia Plan  ASA: III  Anesthesia Plan: MAC   Post-op Pain Management:    Induction: Intravenous  PONV Risk Score and Plan: Propofol infusion and Treatment may vary due to age or medical condition  Airway Management Planned: Nasal Cannula  Additional Equipment: None  Intra-op Plan:   Post-operative Plan:   Informed Consent: I have reviewed the patients History and Physical, chart, labs and discussed the procedure including the risks, benefits and alternatives for the proposed anesthesia with the patient or authorized representative who has indicated his/her understanding and acceptance.   Dental advisory given  Plan Discussed with: CRNA  Anesthesia Plan Comments:         Anesthesia Quick Evaluation

## 2016-10-29 ENCOUNTER — Ambulatory Visit (HOSPITAL_COMMUNITY): Payer: Medicare Other | Admitting: Anesthesiology

## 2016-10-29 ENCOUNTER — Ambulatory Visit (HOSPITAL_COMMUNITY)
Admission: RE | Admit: 2016-10-29 | Discharge: 2016-10-29 | Disposition: A | Discharge status: Home / Self Care | Payer: Medicare Other | Source: Ambulatory Visit | Attending: Internal Medicine | Admitting: Internal Medicine

## 2016-10-29 ENCOUNTER — Encounter (HOSPITAL_COMMUNITY): Payer: Self-pay | Admitting: *Deleted

## 2016-10-29 ENCOUNTER — Encounter (HOSPITAL_COMMUNITY)
Admission: RE | Disposition: A | Discharge status: Home / Self Care | Payer: Self-pay | Source: Ambulatory Visit | Attending: Internal Medicine

## 2016-10-29 DIAGNOSIS — Z888 Allergy status to other drugs, medicaments and biological substances status: Secondary | ICD-10-CM | POA: Diagnosis not present

## 2016-10-29 DIAGNOSIS — Q438 Other specified congenital malformations of intestine: Secondary | ICD-10-CM | POA: Insufficient documentation

## 2016-10-29 DIAGNOSIS — M1711 Unilateral primary osteoarthritis, right knee: Secondary | ICD-10-CM | POA: Insufficient documentation

## 2016-10-29 DIAGNOSIS — I10 Essential (primary) hypertension: Secondary | ICD-10-CM | POA: Diagnosis not present

## 2016-10-29 DIAGNOSIS — D120 Benign neoplasm of cecum: Secondary | ICD-10-CM | POA: Diagnosis not present

## 2016-10-29 DIAGNOSIS — K5730 Diverticulosis of large intestine without perforation or abscess without bleeding: Secondary | ICD-10-CM | POA: Diagnosis not present

## 2016-10-29 DIAGNOSIS — Z7984 Long term (current) use of oral hypoglycemic drugs: Secondary | ICD-10-CM | POA: Diagnosis not present

## 2016-10-29 DIAGNOSIS — Z7982 Long term (current) use of aspirin: Secondary | ICD-10-CM | POA: Insufficient documentation

## 2016-10-29 DIAGNOSIS — G4733 Obstructive sleep apnea (adult) (pediatric): Secondary | ICD-10-CM | POA: Diagnosis not present

## 2016-10-29 DIAGNOSIS — Z86010 Personal history of colonic polyps: Secondary | ICD-10-CM | POA: Insufficient documentation

## 2016-10-29 DIAGNOSIS — Z8619 Personal history of other infectious and parasitic diseases: Secondary | ICD-10-CM | POA: Diagnosis not present

## 2016-10-29 DIAGNOSIS — N529 Male erectile dysfunction, unspecified: Secondary | ICD-10-CM | POA: Diagnosis not present

## 2016-10-29 DIAGNOSIS — I2510 Atherosclerotic heart disease of native coronary artery without angina pectoris: Secondary | ICD-10-CM | POA: Insufficient documentation

## 2016-10-29 DIAGNOSIS — E1151 Type 2 diabetes mellitus with diabetic peripheral angiopathy without gangrene: Secondary | ICD-10-CM | POA: Insufficient documentation

## 2016-10-29 DIAGNOSIS — Z96652 Presence of left artificial knee joint: Secondary | ICD-10-CM | POA: Insufficient documentation

## 2016-10-29 DIAGNOSIS — D122 Benign neoplasm of ascending colon: Secondary | ICD-10-CM | POA: Diagnosis not present

## 2016-10-29 DIAGNOSIS — Z79899 Other long term (current) drug therapy: Secondary | ICD-10-CM | POA: Diagnosis not present

## 2016-10-29 DIAGNOSIS — Z6839 Body mass index (BMI) 39.0-39.9, adult: Secondary | ICD-10-CM | POA: Insufficient documentation

## 2016-10-29 HISTORY — PX: COLONOSCOPY WITH PROPOFOL: SHX5780

## 2016-10-29 HISTORY — PX: HOT HEMOSTASIS: SHX5433

## 2016-10-29 LAB — GLUCOSE, CAPILLARY: Glucose-Capillary: 122 mg/dL — ABNORMAL HIGH (ref 65–99)

## 2016-10-29 SURGERY — COLONOSCOPY WITH PROPOFOL
Anesthesia: Monitor Anesthesia Care

## 2016-10-29 MED ORDER — PROPOFOL 500 MG/50ML IV EMUL
INTRAVENOUS | Status: DC | PRN
  Administered 2016-10-29: 200 ug/kg/min via INTRAVENOUS

## 2016-10-29 MED ORDER — PROPOFOL 10 MG/ML IV BOLUS
INTRAVENOUS | Status: AC
  Filled 2016-10-29: qty 60

## 2016-10-29 MED ORDER — LACTATED RINGERS IV SOLN
INTRAVENOUS | Status: DC
  Administered 2016-10-29: 1000 mL via INTRAVENOUS

## 2016-10-29 MED ORDER — SODIUM CHLORIDE 0.9 % IV SOLN: INTRAVENOUS | Status: DC

## 2016-10-29 SURGICAL SUPPLY — 22 items

## 2016-10-29 NOTE — H&P (Signed)
[facility] History and Physical   Primary Care Physician:  Ria Bush, MD   Reason for Procedure:   f/u large cecal colon adenoma  Plan:    Colonoscopy, possible biopsy, polypectomy - The risks and benefits as well as alternatives of endoscopic procedure(s) have been discussed and reviewed. All questions answered. The patient agrees to proceed.   HPI: Dennis Ayers is a 66 y.o. male s/p resection of 2.5 cm cecal TV adenoma last year here for close polyp f/u. No GI c/o.  Past Medical History:  Diagnosis Date  . Coronary artery calcification   . Diabetes mellitus without complication ([place])   . DJD (degenerative joint disease) of knee    [place] steroid injection (01/2014)  . Hyperglycemia   . Hypertension   . Jaundice    with icterus, when very young, treated and resolved  . Knee pain   . Morbid obesity ([place])   . OSA (obstructive sleep apnea)    mild to mod, working on weight loss, no cpap needed per test  . Osteoarthritis of knee 07/23/2011   blat s/p L TKR ([place])  . Severe obesity (BMI >= 40) ([place]) 02/15/2008  . Sleep apnea    does not have a cpap  . Viral hepatitis 8th grade   has had jaundice in past, unsure of cause ?Hep A    Past Surgical History:  Procedure Laterality Date  . CHOLECYSTECTOMY  1998  . COLONOSCOPY  08/20/2011   4 adenomatous polyps - rpt 3 yrs Gatha Mayer)  . COLONOSCOPY  07-22-15   mult TAs, severe diverticulosis, one large 2.5cm cecal polyp pending resection Carlean Purl)  . COLONOSCOPY N/A 09/10/2015   large TA, rpt 1 yr Gatha Mayer, MD)  . EYE SURGERY Right    laser  . TONSILLECTOMY    . TOTAL KNEE ARTHROPLASTY Left 07/25/2012   Surgeon: Johnny Bridge, MD  . 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 Alphonzo Severance    Prior to Admission medications   Medication Sig Start Date End Date Taking? Authorizing Provider  aspirin EC 81 MG tablet Take 81 mg by mouth daily.   Yes [provider]  Bisacodyl (DULCOLAX PO) Take 2  tablets by mouth as directed. Dulcolax as directed for colonoscopy prep    Yes [provider]  Continuous Blood Gluc Receiver (FREESTYLE LIBRE READER) DEVI 1 Device by Does not apply route 2 (two) times daily. 10/01/16  Yes Ria Bush, MD  Continuous Blood Gluc Sensor (FREESTYLE LIBRE SENSOR SYSTEM) MISC USE AS DIRECTED EVERY 10 DAYS 10/01/16  Yes Ria Bush, MD  DiphenhydrAMINE HCl, Sleep, (UNISOM SLEEPMELTS) 25 MG TBDP Take 25 mg by mouth at bedtime.    Yes [provider]  ibuprofen (ADVIL,MOTRIN) 200 MG tablet Take 600 mg by mouth daily as needed for moderate pain.   Yes [provider]  Javier Docker Oil 300 MG CAPS Take 300 mg by mouth daily.   Yes [provider]  losartan (COZAAR) 50 MG tablet Take 1 tablet (50 mg total) by mouth daily. 08/18/16 11/16/16 Yes End, Harrell Gave, MD  metFORMIN (GLUCOPHAGE) 500 MG tablet Take 1 tablet (500 mg total) by mouth 2 (two) times daily with a meal. 07/09/16  Yes Ria Bush, MD  Misc Natural Products (OSTEO BI-FLEX JOINT SHIELD PO) Take 1 capsule by mouth daily.    Yes [provider]  Multiple Vitamin (MULTIVITAMIN) tablet Take 1 tablet by mouth daily.   Yes [provider]  Polyethylene Glycol  3350 (MIRALAX PO) Take by mouth as directed. Miralax 238 Grams as directed for colonoscopy prep   Yes [provider]  sildenafil (VIAGRA) 100 MG tablet Take 0.5-1 tablets (50-100 mg total) by mouth daily as needed for erectile dysfunction. 07/10/15   Ria Bush, MD    Current Facility-Administered Medications  Medication Dose Route Frequency Provider Last Rate Last Dose  . 0.9 %  sodium chloride infusion   Intravenous Continuous Gatha Mayer, MD      . lactated ringers infusion   Intravenous Continuous Gatha Mayer, MD 10 mL/hr at 10/29/16 0707 1,000 mL at 10/29/16 0707    Allergies as of 09/10/2016 - Review Complete 09/10/2016  Allergen Reaction Noted  . Lisinopril Other  (See Comments) 09/10/2016    Family History  Problem Relation Age of Onset  . Ovarian cancer Mother   . Ulcers Mother        stomach  . Coronary artery disease Father 30       CABG X4  . Hypertension Father   . Stroke Father 65  . Heart attack Father 90  . Heart attack Sister 46  . Heart Problems Paternal Aunt   . Heart attack Paternal Uncle   . Heart attack Paternal Aunt   . Heart attack Paternal Uncle   . Diabetes Neg Hx   . Prostate cancer Neg Hx   . Breast cancer Neg Hx   . Colon cancer Neg Hx   . Depression Neg Hx   . Alcohol abuse Neg Hx     Social History   Social History  . Marital status: Married    Spouse name: N/A  . Number of children: 1  . Years of education: N/A   Occupational History  . Self-employed    Social History Main Topics  . Smoking status: Never Smoker  . Smokeless tobacco: Never Used  . Alcohol use No  . Drug use: No  . Sexual activity: Not on file   Other Topics Concern  . Not on file   Social History Narrative   Caffeine: 2-3 diet sodas/day   Lives with wife   1 grown son.   Occupation: Self-employed; Psychologist, forensic   Activity: some golf, limited by knee pain   Diet: good water, daily vegetables    Review of Systems: Positive for new dx DM All other review of systems negative except as mentioned in the HPI.  Physical Exam: Vital signs in last 24 hours: Temp:  [97.8 F (36.6 C)] 97.8 F (36.6 C) (09/07 0701) Pulse Rate:  [88] 88 (09/07 0701) Resp:  [20] 20 (09/07 0701) BP: (131)/(72) 131/72 (09/07 0701) SpO2:  [98 %] 98 % (09/07 0701) Weight:  [311 lb (141.1 kg)] 311 lb (141.1 kg) (09/07 0701)   General:   Alert,  Well-developed, well-nourished, pleasant and cooperative in NAD Lungs:  Clear throughout to auscultation.   Heart:  Regular rate and rhythm; no murmurs, clicks, rubs,  or gallops. Abdomen:  Soft, nontender and nondistended. Normal bowel sounds.   Neuro/Psych:  Alert and cooperative.  Normal mood and affect. A and O x 3   @Carl  Simonne Maffucci, MD, [facility] [phone] (pager) 10/29/2016 7:38 AM@

## 2016-10-29 NOTE — Transfer of Care (Signed)
Immediate Anesthesia Transfer of Care Note  Patient: Dennis Ayers  Procedure(s) Performed: Procedure(s): COLONOSCOPY WITH PROPOFOL (N/A) HOT HEMOSTASIS (ARGON PLASMA COAGULATION/BICAP) (N/A)  Patient Location: PACU  Anesthesia Type:MAC  Level of Consciousness: awake, alert , oriented and patient cooperative  Airway & Oxygen Therapy: Patient Spontanous Breathing and Patient connected to face mask oxygen  Post-op Assessment: Report given to RN, Post -op Vital signs reviewed and stable and Patient moving all extremities X 4  Post vital signs: stable  Last Vitals:  Vitals:   10/29/16 0858 10/29/16 0900  BP: (!) 122/50 (!) 122/50  Pulse: 74 78  Resp: 12 18  Temp: 36.6 C   SpO2: 97% 98%    Last Pain:  Vitals:   10/29/16 0858  TempSrc: Oral         Complications: No apparent anesthesia complications

## 2016-10-29 NOTE — Op Note (Addendum)
[facility] Patient Name: Dennis Ayers Procedure Date: 10/29/2016 MRN: [id number] Attending MD: Gatha Mayer , MD Date of Birth: 06-07-1950 CSN: [id number] Age: 66 Admit Type: Outpatient Procedure:                Colonoscopy Indications:              Surveillance: Piecemeal removal of large sessile                            adenoma last colonoscopy (< 3 yrs) Providers:                Gatha Mayer, MD, Laverta Baltimore RN, RN,                            Alan Mulder, Technician Referring MD:              Medicines:                Propofol per Anesthesia, Monitored Anesthesia Care Complications:            No immediate complications. Estimated Blood Loss:     Estimated blood loss was minimal. Procedure:                Pre-Anesthesia Assessment:                           - Prior to the procedure, a History and Physical                            was performed, and patient medications and                            allergies were reviewed. The patient's tolerance of                            previous anesthesia was also reviewed. The risks                            and benefits of the procedure and the sedation                            options and risks were discussed with the patient.                            All questions were answered, and informed consent                            was obtained. Prior Anticoagulants: The patient has                            taken no previous anticoagulant or antiplatelet                            agents. ASA Grade Assessment: III - A patient with  severe systemic disease. After reviewing the risks                            and benefits, the patient was deemed in                            satisfactory condition to undergo the procedure.                           After obtaining informed consent, the colonoscope                            was passed under direct vision. Throughout the                      procedure, the patient's blood pressure, pulse, and                            oxygen saturations were monitored continuously. The                            EC-[id number] ([id number]) scope was introduced through                            the anus and advanced to the the cecum, identified                            by appendiceal orifice and ileocecal valve. The                            colonoscopy was somewhat difficult due to                            significant looping. Successful completion of the                            procedure was aided by abdominal binder application                            (extra-large). The patient tolerated the procedure                            well. The quality of the bowel preparation was                            adequate. The bowel preparation used was Miralax.                            The ileocecal valve, appendiceal orifice, and                            rectum were photographed. Scope In: 8:30:30 AM Scope Out: 8:50:21 AM Scope Withdrawal Time: 0 hours 15 minutes 50 seconds  Total Procedure Duration: 0 hours 19 minutes 51 seconds  Findings:  The perianal and digital rectal examinations were normal. Pertinent       negatives include normal prostate (size, shape, and consistency).      A 5 mm polyp was found in the cecum. The polyp was sessile. The polyp       was removed with a cold snare. Resection and retrieval were complete.       Verification of patient identification for the specimen was done.       Estimated blood loss was minimal.      Multiple small and large-mouthed diverticula were found in the sigmoid       colon and descending colon. There was narrowing of the colon in       association with the diverticular opening.      The exam was otherwise without abnormality on direct and retroflexion       views. Impression:               - One 5 mm polyp in the cecum, removed with a cold                             snare. Resected and retrieved. Probably some                            residual polyp from prior large cecal polyp                           - Moderate diverticulosis in the sigmoid colon and                            in the descending colon. There was narrowing of the                            colon in association with the diverticular opening.                           - The examination was otherwise normal on direct                            and retroflexion views.                           - Personal history of colonic polyps. Numerous                            adenomas and 25 mm TV adenoma removed cecum 08/2015 Moderate Sedation:      N/A- Per Anesthesia Care Recommendation:           - Patient has a contact number available for                            emergencies. The signs and symptoms of potential                            delayed complications were discussed with the  patient. Return to normal activities tomorrow.                            Written discharge instructions were provided to the                            patient.                           - Resume previous diet.                           - Continue present medications.                           - Repeat colonoscopy is recommended for                            surveillance. The colonoscopy date will be                            determined after pathology results from today's                            exam become available for review.                           - Suspect next colonoscopy in [place] as APC unlikely                            to be needed, but use an abdominal binder again. Procedure Code(s):        --- Professional ---                           [phone], Colonoscopy, flexible; with removal of                            tumor(s), polyp(s), or other lesion(s) by snare                            technique Diagnosis Code(s):        --- Professional ---                            Z86.010, Personal history of colonic polyps                           D12.0, Benign neoplasm of cecum                           K57.30, Diverticulosis of large intestine without                            perforation or abscess without bleeding CPT copyright 2016 [employer]. All rights reserved. The codes documented in this report are preliminary and upon coder review may  be revised  to meet current compliance requirements. Gatha Mayer, MD 10/29/2016 9:06:36 AM This report has been signed electronically. Number of Addenda: 0

## 2016-10-29 NOTE — Discharge Instructions (Signed)
° °  There was one small polyp I saw - likely residual polyp from the large one. All else looked ok. I will review the pathology and make a recommendation when to do colonoscopy again.  I appreciate the opportunity to care for you. Gatha Mayer, MD, FACG  YOU HAD AN ENDOSCOPIC PROCEDURE TODAY: Refer to the procedure report and other information in the discharge instructions given to you for any specific questions about what was found during the examination. If this information does not answer your questions, please call Dr. Celesta Aver office at [phone] to clarify.   YOU SHOULD EXPECT: Some feelings of bloating in the abdomen. Passage of more gas than usual. Walking can help get rid of the air that was put into your GI tract during the procedure and reduce the bloating. If you had a lower endoscopy (such as a colonoscopy or flexible sigmoidoscopy) you may notice spotting of blood in your stool or on the toilet paper. Some abdominal soreness may be present for a day or two, also.  DIET: Your first meal following the procedure should be a light meal and then it is ok to progress to your normal diet. A half-sandwich or bowl of soup is an example of a good first meal. Heavy or fried foods are harder to digest and may make you feel nauseous or bloated. Drink plenty of fluids but you should avoid alcoholic beverages for 24 hours.   ACTIVITY: Your care partner should take you home directly after the procedure. You should plan to take it easy, moving slowly for the rest of the day. You can resume normal activity the day after the procedure however YOU SHOULD NOT DRIVE, use power tools, machinery or perform tasks that involve climbing or major physical exertion for 24 hours (because of the sedation medicines used during the test).   SYMPTOMS TO REPORT IMMEDIATELY: A gastroenterologist can be reached at any hour. Please call [phone]  for any of the following symptoms:  Following lower endoscopy  (colonoscopy, flexible sigmoidoscopy) Excessive amounts of blood in the stool  Significant tenderness, worsening of abdominal pains  Swelling of the abdomen that is new, acute  Fever of 100 or higher

## 2016-10-29 NOTE — Anesthesia Postprocedure Evaluation (Signed)
Anesthesia Post Note  Patient: Dennis Ayers  Procedure(s) Performed: Procedure(s) (LRB): COLONOSCOPY WITH PROPOFOL (N/A) HOT HEMOSTASIS (ARGON PLASMA COAGULATION/BICAP) (N/A)     Patient location during evaluation: PACU Anesthesia Type: MAC Level of consciousness: awake and alert Pain management: pain level controlled Vital Signs Assessment: post-procedure vital signs reviewed and stable Respiratory status: spontaneous breathing, nonlabored ventilation and respiratory function stable Cardiovascular status: stable and blood pressure returned to baseline Anesthetic complications: no    Last Vitals:  Vitals:   10/29/16 0858 10/29/16 0900  BP: (!) 122/50 (!) 122/50  Pulse: 74 78  Resp: 12 18  Temp: 36.6 C   SpO2: 97% 98%    Last Pain:  Vitals:   10/29/16 0858  TempSrc: Oral                 Audry Pili

## 2016-10-29 NOTE — Anesthesia Procedure Notes (Addendum)
Procedure Name: MAC Date/Time: 10/29/2016 8:22 AM Performed by: Lissa Morales Pre-anesthesia Checklist: Patient identified, Emergency Drugs available, Suction available and Patient being monitored Patient Re-evaluated:Patient Re-evaluated prior to induction Oxygen Delivery Method: Simple face mask Placement Confirmation: positive ETCO2 Tube secured with: Tape Dental Injury: Teeth and Oropharynx as per pre-operative assessment

## 2016-10-31 ENCOUNTER — Encounter: Payer: Self-pay | Admitting: Family Medicine

## 2016-11-01 ENCOUNTER — Encounter (HOSPITAL_COMMUNITY): Payer: Self-pay | Admitting: Internal Medicine

## 2016-11-02 ENCOUNTER — Encounter: Payer: Self-pay | Admitting: Family Medicine

## 2016-11-05 ENCOUNTER — Encounter: Payer: Self-pay | Admitting: Internal Medicine

## 2016-11-05 NOTE — Progress Notes (Signed)
Colon recall in 10/2017 My Chart letter

## 2016-11-08 DIAGNOSIS — M1711 Unilateral primary osteoarthritis, right knee: Secondary | ICD-10-CM | POA: Diagnosis not present

## 2016-11-11 ENCOUNTER — Encounter: Payer: Medicare Other | Admitting: Internal Medicine

## 2016-12-05 ENCOUNTER — Encounter: Payer: Self-pay | Admitting: Family Medicine

## 2016-12-07 MED ORDER — METFORMIN HCL 500 MG PO TABS: 500.0000 mg | ORAL_TABLET | Freq: Every day | ORAL | 3 refills | Status: DC

## 2016-12-07 MED ORDER — FREESTYLE LIBRE SENSOR SYSTEM MISC: 1.0000 [IU] | 11 refills | Status: DC

## 2016-12-07 NOTE — Telephone Encounter (Signed)
Sensor picking up lows this past week. Lab Results  Component Value Date   HGBA1C 6.1 09/03/2016

## 2016-12-09 ENCOUNTER — Telehealth: Payer: Self-pay | Admitting: Family Medicine

## 2016-12-09 DIAGNOSIS — E118 Type 2 diabetes mellitus with unspecified complications: Principal | ICD-10-CM

## 2016-12-09 DIAGNOSIS — IMO0002 Reserved for concepts with insufficient information to code with codable children: Secondary | ICD-10-CM

## 2016-12-09 DIAGNOSIS — E1165 Type 2 diabetes mellitus with hyperglycemia: Secondary | ICD-10-CM

## 2016-12-09 MED ORDER — FREESTYLE LIBRE SENSOR SYSTEM MISC: 1.0000 [IU] | 3 refills | Status: DC

## 2016-12-09 NOTE — Addendum Note (Signed)
Addended by: Ria Bush on: 12/09/2016 02:06 PM   Modules accepted: Orders

## 2016-12-09 NOTE — Telephone Encounter (Signed)
Pt sent MyChart msg requesting Urine Microalbumin labs. Is he due, if so he needs orders.

## 2016-12-12 NOTE — Telephone Encounter (Signed)
As long as he continues taking losartan (on med list), he doesn't need microalbumin urine.  Ok to wait until 02/2017 for f/u as scheduled unless ongoing low sugars

## 2016-12-15 NOTE — Telephone Encounter (Signed)
Spoke with pt relaying message per Dr. Oneita Jolly he is requesting to have micro alb added to labs in 02/2017.

## 2016-12-16 NOTE — Telephone Encounter (Signed)
Ordered

## 2017-03-03 ENCOUNTER — Other Ambulatory Visit: Payer: Self-pay | Admitting: Family Medicine

## 2017-03-03 DIAGNOSIS — D751 Secondary polycythemia: Secondary | ICD-10-CM

## 2017-03-03 DIAGNOSIS — IMO0002 Reserved for concepts with insufficient information to code with codable children: Secondary | ICD-10-CM

## 2017-03-03 DIAGNOSIS — E1165 Type 2 diabetes mellitus with hyperglycemia: Secondary | ICD-10-CM

## 2017-03-03 DIAGNOSIS — E118 Type 2 diabetes mellitus with unspecified complications: Secondary | ICD-10-CM

## 2017-03-04 ENCOUNTER — Other Ambulatory Visit (INDEPENDENT_AMBULATORY_CARE_PROVIDER_SITE_OTHER): Payer: Medicare Other

## 2017-03-04 DIAGNOSIS — E1165 Type 2 diabetes mellitus with hyperglycemia: Secondary | ICD-10-CM

## 2017-03-04 DIAGNOSIS — E118 Type 2 diabetes mellitus with unspecified complications: Secondary | ICD-10-CM | POA: Diagnosis not present

## 2017-03-04 DIAGNOSIS — IMO0002 Reserved for concepts with insufficient information to code with codable children: Secondary | ICD-10-CM

## 2017-03-04 LAB — COMPREHENSIVE METABOLIC PANEL
ALK PHOS: 50 U/L (ref 39–117)
ALT: 24 U/L (ref 0–53)
AST: 17 U/L (ref 0–37)
Albumin: 4.6 g/dL (ref 3.5–5.2)
BILIRUBIN TOTAL: 0.8 mg/dL (ref 0.2–1.2)
BUN: 22 mg/dL (ref 6–23)
CHLORIDE: 102 meq/L (ref 96–112)
CO2: 28 mEq/L (ref 19–32)
CREATININE: 0.97 mg/dL (ref 0.40–1.50)
Calcium: 9.4 mg/dL (ref 8.4–10.5)
GFR: 81.97 mL/min (ref >60.00)
Glucose, Bld: 125 mg/dL — ABNORMAL HIGH (ref 70–99)
Potassium: 4.1 mEq/L (ref 3.5–5.1)
SODIUM: 137 meq/L (ref 135–145)
TOTAL PROTEIN: 7.6 g/dL (ref 6.0–8.3)

## 2017-03-04 LAB — MICROALBUMIN / CREATININE URINE RATIO
Creatinine,U: 168.6 mg/dL
Microalb Creat Ratio: 0.5 mg/g (ref 0.0–30.0)
Microalb, Ur: 0.8 mg/dL (ref 0.0–1.9)

## 2017-03-04 LAB — HEMOGLOBIN A1C: Hgb A1c MFr Bld: 5.4 % (ref 4.6–6.5)

## 2017-03-11 ENCOUNTER — Ambulatory Visit: Payer: Medicare Other | Admitting: Family Medicine

## 2017-03-11 ENCOUNTER — Encounter: Payer: Self-pay | Admitting: Family Medicine

## 2017-03-11 VITALS — BP 130/76 | HR 87 | Temp 98.0°F | Wt 316.0 lb

## 2017-03-11 DIAGNOSIS — E119 Type 2 diabetes mellitus without complications: Secondary | ICD-10-CM | POA: Diagnosis not present

## 2017-03-11 DIAGNOSIS — Z23 Encounter for immunization: Secondary | ICD-10-CM

## 2017-03-11 MED ORDER — METFORMIN HCL 500 MG PO TABS: 500.0000 mg | ORAL_TABLET | Freq: Two times a day (BID) | ORAL | 1 refills | Status: DC

## 2017-03-11 MED ORDER — FREESTYLE LIBRE 14 DAY READER DEVI: 1.0000 [IU] | 0 refills | Status: DC

## 2017-03-11 MED ORDER — FREESTYLE LIBRE 14 DAY SENSOR MISC: 1.0000 [IU] | 3 refills | Status: DC

## 2017-03-11 NOTE — Assessment & Plan Note (Signed)
Chronic, stable. Wonderful control based on sugars he brings from CGM and recent A1c, pt motivated to continue optimizing diabetic control. RTC 31mo CPE.

## 2017-03-11 NOTE — Patient Instructions (Signed)
Hep B shot today You are doing great! Keep up the wonderful sugar control! Return as needed or in 6 months for physical

## 2017-03-11 NOTE — Progress Notes (Signed)
BP 130/76 (BP Location: Left Arm, Patient Position: Sitting, Cuff Size: Large)   Pulse 87   Temp 98 F (36.7 C) (Oral)   Wt (!) 316 lb (143.3 kg)   SpO2 97%   BMI 40.57 kg/m    CC: 89mo f/u visit Subjective:    Patient ID: Dennis Ayers, male    DOB: [birth year], 67 y.o.   MRN: [id number]  HPI: Dennis Ayers is a 67 y.o. male presenting on 03/11/2017 for 6 mo follow-up (Wants to change [place] monitor and sensor to 14 day. Will need new rx)   DM - relatively new diagnosis. Does regularly check sugars and brings log - has freestyle libre CGM system. aveage glucose 105. Compliant with antihyperglycemic regimen which includes: metformin 500mg  bid. 2 low sugars without hypoglycemic symptoms. None since. Denies paresthesias. Last diabetic eye exam 06/2016. Pneumovax: 2017. Prevnar: 2017. Glucometer brand: freestyle libre CGM. DSME: completed.  Lab Results  Component Value Date   HGBA1C 5.4 03/04/2017   Diabetic Foot Exam - Simple   Simple Foot Form Diabetic Foot exam was performed with the following findings:  Yes 03/11/2017  8:36 AM  Visual Inspection No deformities, no ulcerations, no other skin breakdown bilaterally:  Yes Sensation Testing Intact to touch and monofilament testing bilaterally:  Yes Pulse Check Posterior Tibialis and Dorsalis pulse intact bilaterally:  Yes Comments    Lab Results  Component Value Date   MICROALBUR 0.8 03/04/2017     Relevant past medical, surgical, family and social history reviewed and updated as indicated. Interim medical history since our last visit reviewed. Allergies and medications reviewed and updated. Outpatient Medications Prior to Visit  Medication Sig Dispense Refill  . aspirin EC 81 MG tablet Take 81 mg by mouth daily.    . DiphenhydrAMINE HCl, Sleep, (UNISOM SLEEPMELTS) 25 MG TBDP Take 25 mg by mouth at bedtime.     Marland Kitchen ibuprofen (ADVIL,MOTRIN) 200 MG tablet Take 600 mg by mouth daily as needed for moderate pain.    Anadalay Macdonell Docker Oil  300 MG CAPS Take 300 mg by mouth daily.    . Misc Natural Products (OSTEO BI-FLEX JOINT SHIELD PO) Take 1 capsule by mouth daily.     . Multiple Vitamin (MULTIVITAMIN) tablet Take 1 tablet by mouth daily.    . sildenafil (VIAGRA) 100 MG tablet Take 0.5-1 tablets (50-100 mg total) by mouth daily as needed for erectile dysfunction. 9 tablet 6  . Continuous Blood Gluc Receiver (FREESTYLE LIBRE READER) DEVI 1 Device by Does not apply route 2 (two) times daily. 1 Device 0  . Continuous Blood Gluc Sensor (FREESTYLE LIBRE SENSOR SYSTEM) MISC Apply 1 Units topically as directed. Change sensor every 10 days 9 each 3  . metFORMIN (GLUCOPHAGE) 500 MG tablet Take 1 tablet (500 mg total) by mouth daily with breakfast. (Patient taking differently: Take 500 mg by mouth 2 (two) times daily with a meal. ) 90 tablet 3  . losartan (COZAAR) 50 MG tablet Take 1 tablet (50 mg total) by mouth daily. 90 tablet 3   No facility-administered medications prior to visit.      Per HPI unless specifically indicated in ROS section below Review of Systems     Objective:    BP 130/76 (BP Location: Left Arm, Patient Position: Sitting, Cuff Size: Large)   Pulse 87   Temp 98 F (36.7 C) (Oral)   Wt (!) 316 lb (143.3 kg)   SpO2 97%   BMI 40.57 kg/m   Wt  Readings from Last 3 Encounters:  03/11/17 (!) 316 lb (143.3 kg)  10/29/16 (!) 311 lb (141.1 kg)  10/15/16 (!) 324 lb 9.6 oz (147.2 kg)    Physical Exam  Constitutional: He appears well-developed and well-nourished. No distress.  HENT:  Head: Normocephalic and atraumatic.  Right Ear: External ear normal.  Left Ear: External ear normal.  Nose: Nose normal.  Mouth/Throat: Oropharynx is clear and moist. No oropharyngeal exudate.  Eyes: Conjunctivae and EOM are normal. Pupils are equal, round, and reactive to light. No scleral icterus.  Neck: Normal range of motion. Neck supple.  Cardiovascular: Normal rate, regular rhythm, normal heart sounds and intact distal  pulses.  No murmur heard. Pulmonary/Chest: Effort normal and breath sounds normal. No respiratory distress. He has no wheezes. He has no rales.  Musculoskeletal: He exhibits no edema.  See HPI for foot exam if done  Lymphadenopathy:    He has no cervical adenopathy.  Skin: Skin is warm and dry. No rash noted.  Psychiatric: He has a normal mood and affect.  Nursing note and vitals reviewed.  Results for orders placed or performed in visit on 03/04/17  Microalbumin / creatinine urine ratio  Result Value Ref Range   Microalb, Ur 0.8 0.0 - 1.9 mg/dL   Creatinine,U 168.6 mg/dL   Microalb Creat Ratio 0.5 0.0 - 30.0 mg/g  Hemoglobin A1c  Result Value Ref Range   Hgb A1c MFr Bld 5.4 4.6 - 6.5 %  Comprehensive metabolic panel  Result Value Ref Range   Sodium 137 135 - 145 mEq/L   Potassium 4.1 3.5 - 5.1 mEq/L   Chloride 102 96 - 112 mEq/L   CO2 28 19 - 32 mEq/L   Glucose, Bld 125 (H) 70 - 99 mg/dL   BUN 22 6 - 23 mg/dL   Creatinine, Ser 0.97 0.40 - 1.50 mg/dL   Total Bilirubin 0.8 0.2 - 1.2 mg/dL   Alkaline Phosphatase 50 39 - 117 U/L   AST 17 0 - 37 U/L   ALT 24 0 - 53 U/L   Total Protein 7.6 6.0 - 8.3 g/dL   Albumin 4.6 3.5 - 5.2 g/dL   Calcium 9.4 8.4 - 10.5 mg/dL   GFR 81.97 >60.00 mL/min      Assessment & Plan:   Problem List Items Addressed This Visit    Controlled type 2 diabetes mellitus without complication, without long-term current use of insulin (HCC) - Primary    Chronic, stable. Wonderful control based on sugars he brings from CGM and recent A1c, pt motivated to continue optimizing diabetic control. RTC 32mo CPE.       Relevant Medications   metFORMIN (GLUCOPHAGE) 500 MG tablet   Obesity, morbid, BMI 40.0-49.9 ([place])   Relevant Medications   metFORMIN (GLUCOPHAGE) 500 MG tablet       Follow up plan: Return in about 6 months (around 09/08/2017) for annual exam, prior fasting for blood work, medicare wellness visit.  Ria Bush, MD

## 2017-03-11 NOTE — Addendum Note (Signed)
Addended by: Brenton Grills on: 9/84/2103 12:81 PM   Modules accepted: Orders

## 2017-06-14 ENCOUNTER — Ambulatory Visit (INDEPENDENT_AMBULATORY_CARE_PROVIDER_SITE_OTHER): Payer: Medicare Other

## 2017-06-14 DIAGNOSIS — Z23 Encounter for immunization: Secondary | ICD-10-CM | POA: Diagnosis not present

## 2017-07-01 DIAGNOSIS — H524 Presbyopia: Secondary | ICD-10-CM | POA: Diagnosis not present

## 2017-07-01 LAB — HM DIABETES EYE EXAM

## 2017-07-06 ENCOUNTER — Encounter: Payer: Self-pay | Admitting: Family Medicine

## 2017-09-07 ENCOUNTER — Encounter: Payer: Self-pay | Admitting: Internal Medicine

## 2017-09-07 ENCOUNTER — Ambulatory Visit: Payer: Medicare Other | Admitting: Internal Medicine

## 2017-09-07 VITALS — BP 130/68 | HR 74 | Ht 75.0 in | Wt 308.0 lb

## 2017-09-07 DIAGNOSIS — Z13220 Encounter for screening for lipoid disorders: Secondary | ICD-10-CM | POA: Diagnosis not present

## 2017-09-07 DIAGNOSIS — I10 Essential (primary) hypertension: Secondary | ICD-10-CM | POA: Diagnosis not present

## 2017-09-07 DIAGNOSIS — I2584 Coronary atherosclerosis due to calcified coronary lesion: Secondary | ICD-10-CM

## 2017-09-07 DIAGNOSIS — I2510 Atherosclerotic heart disease of native coronary artery without angina pectoris: Secondary | ICD-10-CM

## 2017-09-07 MED ORDER — LOSARTAN POTASSIUM 50 MG PO TABS: 50.0000 mg | ORAL_TABLET | Freq: Every day | ORAL | 3 refills | Status: DC

## 2017-09-07 MED ORDER — ATORVASTATIN CALCIUM 20 MG PO TABS: 20.0000 mg | ORAL_TABLET | Freq: Every day | ORAL | 3 refills | Status: DC

## 2017-09-07 NOTE — Patient Instructions (Signed)
Medication Instructions:  Your physician has recommended you make the following change in your medication:  1- START Atorvastatin 20 mg by mouth once a day with supper.   Labwork: Your physician recommends that you return for lab work in: 3 MONTHS (LIPID, ALT). - You will need to be FASTING. - On or around October 17th at the [facility]. - Please go to the [facility]. You will check in at the front desk to the right as you walk into the atrium. Valet Parking is offered if needed.    Testing/Procedures: none  Follow-Up: Your physician recommends that you schedule a follow-up appointment in: [place]. Please call our office to schedule the appointment closer to that time.   If you need a refill on your cardiac medications before your next appointment, please call your pharmacy.

## 2017-09-07 NOTE — Progress Notes (Signed)
Follow-up Outpatient Visit Date: 09/07/2017  Primary Care Provider: Ria Bush, MD [place] [address]  Chief Complaint: Follow up coronary artery calcification  HPI:  [name] is a 67 y.o. year-old male with history of coronary artery calcification, type 2 diabetes mellitus, hypertension, morbid obesity, obstructive sleep apnea, and degenerative joint disease, who presents for follow-up of coronary artery calcification.  I last saw [name] a year ago at which time we agreed to obtain an exercise tolerance test.  No ischemia was identified, though exercise capacity was poor, achieving only 4.8 METS.  Today, [name] reports that he has been doing well.  He has not had any episodes of chest pain.  Mild exertional dyspnea is stable to somewhat improved, as Mr. Hands has lost weight and increased his activity since retiring.  He has been trying to lose weight, notably having lost about 16 pounds over the last year.  He denies edema, orthopnea, palpitations, and lightheadedness.  He is trying to stay active in the yard and is also watching his diet.  [name] notes that he ran out of losartan about 3 to 4 weeks ago and has been hesitant to restart this out of concerns about medication recalls.  He has been monitoring his blood pressure at home and notes that it is typically in the 130's/60's.  --------------------------------------------------------------------------------------------------  Cardiovascular History & Procedures: Cardiovascular Problems:  Coronary artery calcification  Dilated thoracic aorta by echo; upper normal on cardiac CT  Risk Factors:  Hypertension, diabetes mellitus, obesity, sedentary lifestyle, male gender, age greater than 77, and family history  Cath/PCI:  None  CV Surgery:  None  EP Procedures and Devices:  None  Non-Invasive Evaluation(s):  Exercise tolerance test (08/23/2016): Poor exercise capacity.   No ischemia at workload achieved (4.8 METS).  Intermediate risk study ([facility] treadmill score 3) due to poor exercise capacity.  TTE (06/29/16): Normal LV size with mild LVH. LVEF 55-60% with normal wall motion. Normal diastolic function. Mildly dilated aortic root, measuring 4.0 cm. Mild left atrial enlargement. Normal RV size and function. No significant valvular abnormalities.  Coronary calcium score (06/09/16): Calcification of all 3 coronary arteries noted within a score of 102 Agatston units (53rd percentile for age and sex matched cohort). Ascending aorta measuring 3.6 cm in diameter. No significant extracardiac abnormalities in the visualized thorax.  Recent CV Pertinent Labs: Lab Results  Component Value Date   CHOL 159 09/03/2016   HDL 49.90 09/03/2016   LDLCALC 96 09/03/2016   TRIG 68.0 09/03/2016   CHOLHDL 3 09/03/2016   INR 1.04 07/19/2012   K 4.1 03/04/2017   BUN 22 03/04/2017   BUN 24 05/19/2016   CREATININE 0.97 03/04/2017    Past medical and surgical history were reviewed and updated in EPIC.  Current Meds  Medication Sig  . aspirin EC 81 MG tablet Take 81 mg by mouth daily.  . Continuous Blood Gluc Receiver (FREESTYLE LIBRE 14 DAY READER) DEVI 1 Units by Does not apply route as directed.  . Continuous Blood Gluc Sensor (FREESTYLE LIBRE 14 DAY SENSOR) MISC 1 Units by Does not apply route every 14 (fourteen) days.  . DiphenhydrAMINE HCl, Sleep, (UNISOM SLEEPMELTS) 25 MG TBDP Take 25 mg by mouth at bedtime.   Marland Kitchen ibuprofen (ADVIL,MOTRIN) 200 MG tablet Take 600 mg by mouth daily as needed for moderate pain.  Javier Docker Oil 300 MG CAPS Take 300 mg by mouth daily.  . metFORMIN (GLUCOPHAGE) 500 MG tablet  Take 1 tablet (500 mg total) by mouth 2 (two) times daily with a meal.  . Misc Natural Products (OSTEO BI-FLEX JOINT SHIELD PO) Take 1 capsule by mouth daily.   . Multiple Vitamin (MULTIVITAMIN) tablet Take 1 tablet by mouth daily.  . sildenafil (VIAGRA) 100 MG tablet Take 0.5-1  tablets (50-100 mg total) by mouth daily as needed for erectile dysfunction.    Allergies: Lisinopril  Social History   Tobacco Use  . Smoking status: Never Smoker  . Smokeless tobacco: Never Used  Substance Use Topics  . Alcohol use: No  . Drug use: No    Family History  Problem Relation Age of Onset  . Ovarian cancer Mother   . Ulcers Mother        stomach  . Coronary artery disease Father 77       CABG X4  . Hypertension Father   . Stroke Father 64  . Heart attack Father 14  . Heart attack Sister 68  . Heart Problems Paternal Aunt   . Heart attack Paternal Uncle   . Heart attack Paternal Aunt   . Heart attack Paternal Uncle   . Diabetes Neg Hx   . Prostate cancer Neg Hx   . Breast cancer Neg Hx   . Colon cancer Neg Hx   . Depression Neg Hx   . Alcohol abuse Neg Hx     Review of Systems: A 12-system review of systems was performed and was negative except as noted in the HPI.  --------------------------------------------------------------------------------------------------  Physical Exam: BP 130/68 (BP Location: Left Arm, Patient Position: Sitting, Cuff Size: Large)   Pulse 74   Ht 6\' 3"  (1.905 m)   Wt (!) 308 lb (139.7 kg)   BMI 38.50 kg/m   General: NAD. HEENT: No conjunctival pallor or scleral icterus. Moist mucous membranes.  OP clear. Neck: Supple without lymphadenopathy, thyromegaly, JVD, or HJR. Lungs: Normal work of breathing. Clear to auscultation bilaterally without wheezes or crackles. Heart: Regular rate and rhythm without murmurs, rubs, or gallops.  Unable to assess PMI due to body habitus. Abd: Bowel sounds present. Soft, NT/ND.  Unable to assess HSM due to body habitus. Ext: No lower extremity edema. Skin: Warm and dry without rash.  EKG: Normal sinus rhythm without significant abnormalities.  Lab Results  Component Value Date   WBC 5.0 09/03/2016   HGB 16.8 09/03/2016   HCT 47.6 09/03/2016   MCV 93.0 09/03/2016   PLT 254.0  09/03/2016    Lab Results  Component Value Date   NA 137 03/04/2017   K 4.1 03/04/2017   CL 102 03/04/2017   CO2 28 03/04/2017   BUN 22 03/04/2017   CREATININE 0.97 03/04/2017   GLUCOSE 125 (H) 03/04/2017   ALT 24 03/04/2017    Lab Results  Component Value Date   CHOL 159 09/03/2016   HDL 49.90 09/03/2016   LDLCALC 96 09/03/2016   TRIG 68.0 09/03/2016   CHOLHDL 3 09/03/2016    --------------------------------------------------------------------------------------------------  ASSESSMENT AND PLAN: Coronary artery calcification If symptoms of worsening coronary insufficiency.  Exercise tolerance test after our last visit was intermediate risk due to decreased exercise capacity, largely driven by knee pain.  Given that Mr. Klasen has remained asymptomatic except for mild stable exertional dyspnea, we have agreed to defer additional testing at this time.  We will focus on risk factor modification and medical therapy.  I have encouraged Mr. Ayers to continue to lose weight.  I have recommended that he begin  a statin for target LDL less than 70.  We will begin atorvastatin 20 mg daily with plans for repeat lipid panel and ALT in about 3 months.  Hypertension Blood pressure borderline elevated today (goal less than 130/80).  I have asked him to begin taking losartan again.  I will defer ongoing management to Dr. [name].  Follow-up: Return to clinic in 1 year  Nelva Bush, MD 09/07/2017 11:03 PM

## 2017-09-08 ENCOUNTER — Other Ambulatory Visit: Payer: Self-pay | Admitting: Family Medicine

## 2017-09-08 DIAGNOSIS — I2584 Coronary atherosclerosis due to calcified coronary lesion: Secondary | ICD-10-CM

## 2017-09-08 DIAGNOSIS — D751 Secondary polycythemia: Secondary | ICD-10-CM

## 2017-09-08 DIAGNOSIS — E119 Type 2 diabetes mellitus without complications: Secondary | ICD-10-CM

## 2017-09-08 DIAGNOSIS — I2510 Atherosclerotic heart disease of native coronary artery without angina pectoris: Secondary | ICD-10-CM

## 2017-09-08 DIAGNOSIS — Z125 Encounter for screening for malignant neoplasm of prostate: Secondary | ICD-10-CM

## 2017-09-09 ENCOUNTER — Ambulatory Visit (INDEPENDENT_AMBULATORY_CARE_PROVIDER_SITE_OTHER): Payer: Medicare Other

## 2017-09-09 ENCOUNTER — Encounter: Payer: Self-pay | Admitting: Internal Medicine

## 2017-09-09 VITALS — BP 124/70 | HR 79 | Temp 97.9°F | Ht 73.5 in | Wt 306.5 lb

## 2017-09-09 DIAGNOSIS — I2584 Coronary atherosclerosis due to calcified coronary lesion: Secondary | ICD-10-CM

## 2017-09-09 DIAGNOSIS — I2510 Atherosclerotic heart disease of native coronary artery without angina pectoris: Secondary | ICD-10-CM

## 2017-09-09 DIAGNOSIS — E119 Type 2 diabetes mellitus without complications: Secondary | ICD-10-CM

## 2017-09-09 DIAGNOSIS — D751 Secondary polycythemia: Secondary | ICD-10-CM

## 2017-09-09 DIAGNOSIS — Z125 Encounter for screening for malignant neoplasm of prostate: Secondary | ICD-10-CM | POA: Diagnosis not present

## 2017-09-09 DIAGNOSIS — Z0000 Encounter for general adult medical examination without abnormal findings: Secondary | ICD-10-CM | POA: Diagnosis not present

## 2017-09-09 LAB — COMPREHENSIVE METABOLIC PANEL
ALK PHOS: 53 U/L (ref 39–117)
ALT: 20 U/L (ref 0–53)
AST: 13 U/L (ref 0–37)
Albumin: 4.5 g/dL (ref 3.5–5.2)
BILIRUBIN TOTAL: 0.7 mg/dL (ref 0.2–1.2)
BUN: 25 mg/dL — AB (ref 6–23)
CO2: 29 mEq/L (ref 19–32)
Calcium: 9.3 mg/dL (ref 8.4–10.5)
Chloride: 105 mEq/L (ref 96–112)
Creatinine, Ser: 0.96 mg/dL (ref 0.40–1.50)
GFR: 82.82 mL/min (ref >60.00)
GLUCOSE: 109 mg/dL — AB (ref 70–99)
Potassium: 4.3 mEq/L (ref 3.5–5.1)
SODIUM: 140 meq/L (ref 135–145)
TOTAL PROTEIN: 7 g/dL (ref 6.0–8.3)

## 2017-09-09 LAB — CBC WITH DIFFERENTIAL/PLATELET
BASOS ABS: 0 10*3/uL (ref 0.0–0.1)
Basophils Relative: 0.7 % (ref 0.0–3.0)
EOS ABS: 0.1 10*3/uL (ref 0.0–0.7)
Eosinophils Relative: 2.8 % (ref 0.0–5.0)
HCT: 47.9 % (ref 39.0–52.0)
Hemoglobin: 16.6 g/dL (ref 13.0–17.0)
LYMPHS ABS: 1.9 10*3/uL (ref 0.7–4.0)
Lymphocytes Relative: 38.1 % (ref 12.0–46.0)
MCHC: 34.6 g/dL (ref 30.0–36.0)
MCV: 94 fl (ref 78.0–100.0)
Monocytes Absolute: 0.4 10*3/uL (ref 0.1–1.0)
Monocytes Relative: 8.6 % (ref 3.0–12.0)
NEUTROS ABS: 2.4 10*3/uL (ref 1.4–7.7)
NEUTROS PCT: 49.8 % (ref 43.0–77.0)
PLATELETS: 226 10*3/uL (ref 150.0–400.0)
RBC: 5.1 Mil/uL (ref 4.22–5.81)
RDW: 13.3 % (ref 11.5–15.5)
WBC: 4.9 10*3/uL (ref 4.0–10.5)

## 2017-09-09 LAB — PSA, MEDICARE: PSA: 2.06 ng/ml (ref 0.10–4.00)

## 2017-09-09 LAB — LIPID PANEL
CHOL/HDL RATIO: 3
Cholesterol: 131 mg/dL (ref 0–200)
HDL: 49.2 mg/dL (ref >39.00)
LDL CALC: 73 mg/dL (ref 0–99)
NONHDL: 82.01
TRIGLYCERIDES: 46 mg/dL (ref 0.0–149.0)
VLDL: 9.2 mg/dL (ref 0.0–40.0)

## 2017-09-09 LAB — HEMOGLOBIN A1C: HEMOGLOBIN A1C: 5.3 % (ref 4.6–6.5)

## 2017-09-09 NOTE — Progress Notes (Signed)
PCP notes:   Health maintenance:  A1C - completed   Abnormal screenings:   None  Patient concerns:   Ringing in left ear - started approx. 2 mths ago  Nurse concerns:  None  Next PCP appt:   09/23/17 @ 1400

## 2017-09-09 NOTE — Progress Notes (Signed)
Subjective:   Dennis Ayers is a 67 y.o. male who presents for Medicare Annual/Subsequent preventive examination.  Review of Systems:  N/A Cardiac Risk Factors include: advanced age (>84men, >76 women);male gender;hypertension;diabetes mellitus;obesity (BMI >30kg/m2)     Objective:    Vitals: BP 124/70 (BP Location: Left Arm, Patient Position: Sitting, Cuff Size: Normal)   Pulse 79   Temp 97.9 F (36.6 C) (Oral)   Ht 6' 1.5" (1.867 m) Comment: no shoes  Wt (!) 306 lb 8 oz (139 kg)   SpO2 97%   BMI 39.89 kg/m   Body mass index is 39.89 kg/m.  Advanced Directives 09/09/2017 10/29/2016 10/21/2016 10/21/2016 10/15/2016 09/03/2016 09/10/2015  Does Patient Have a Medical Advance Directive? Yes Yes Yes Yes Yes Yes Yes  Type of Paramedic of [place];Living will [place];Living will Living will;Healthcare Power of [place];Living will Living will;Healthcare Power of [place];Living will Living will  Does patient want to make changes to medical advance directive? - - - - - - -  Copy of [place] in Chart? Yes No - copy requested No - copy requested No - copy requested - Yes Yes  Pre-existing out of facility DNR order (yellow form or pink MOST form) - - - - - - -    Tobacco Social History   Tobacco Use  Smoking Status Never Smoker  Smokeless Tobacco Never Used     Counseling given: No   Clinical Intake:  Pre-visit preparation completed: Yes  Pain : No/denies pain Pain Score: 0-No pain     Nutritional Status: BMI > 30  Obese Nutritional Risks: None Diabetes: Yes CBG done?: No Did pt. bring in CBG monitor from home?: No  How often do you need to have someone help you when you read instructions, pamphlets, or other written materials from your doctor or pharmacy?: 1 - Never What is the last grade level you completed in school?: Associates  degree  Interpreter Needed?: No  Comments: pt lives with spouse Information entered by :: LPinson, LPN  Past Medical History:  Diagnosis Date  . Benign neoplasm of cecum - 25 mm polyp 08/06/2015  . Coronary artery calcification   . Diabetes mellitus without complication ([place])   . DJD (degenerative joint disease) of knee    [place] steroid injection (01/2014)  . Hyperglycemia   . Hypertension   . Jaundice    with icterus, when very young, treated and resolved  . Knee pain   . Morbid obesity ([place])   . OSA (obstructive sleep apnea)    mild to mod, working on weight loss, no cpap needed per test  . Osteoarthritis of knee 07/23/2011   blat s/p L TKR ([place])  . Severe obesity (BMI >= 40) ([place]) 02/15/2008  . Sleep apnea    does not have a cpap  . Viral hepatitis 8th grade   has had jaundice in past, unsure of cause ?Hep A   Past Surgical History:  Procedure Laterality Date  . CHOLECYSTECTOMY  1998  . COLONOSCOPY  08/20/2011   4 adenomatous polyps - rpt 3 yrs Gatha Mayer)  . COLONOSCOPY  07-22-15   mult TAs, severe diverticulosis, one large 2.5cm cecal polyp pending resection Carlean Purl)  . COLONOSCOPY N/A 09/10/2015   large TA, rpt 1 yr Gatha Mayer, MD)  . COLONOSCOPY WITH PROPOFOL N/A 10/29/2016   TA, rpt 1 yr Carlean Purl, Ofilia Neas, MD)  .  EYE SURGERY Right    laser  . HOT HEMOSTASIS N/A 10/29/2016   Procedure: HOT HEMOSTASIS (ARGON PLASMA COAGULATION/BICAP);  Surgeon: Gatha Mayer, MD;  Location: [facility];  Service: Endoscopy;  Laterality: N/A;  . TONSILLECTOMY    . TOTAL KNEE ARTHROPLASTY Left 07/25/2012   Surgeon: Johnny Bridge, MD  . 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 Alphonzo Severance   Family History  Problem Relation Age of Onset  . Ovarian cancer Mother   . Ulcers Mother        stomach  . Coronary artery disease Father 20       CABG X4  . Hypertension Father   . Stroke Father 108  . Heart attack Father 66  . Heart attack Sister 77  . Heart Problems Paternal Aunt    . Heart attack Paternal Uncle   . Heart attack Paternal Aunt   . Heart attack Paternal Uncle   . Diabetes Neg Hx   . Prostate cancer Neg Hx   . Breast cancer Neg Hx   . Colon cancer Neg Hx   . Depression Neg Hx   . Alcohol abuse Neg Hx    Social History   Socioeconomic History  . Marital status: Married    Spouse name: Not on file  . Number of children: 1  . Years of education: Not on file  . Highest education level: Not on file  Occupational History  . Occupation: Self-employed  Social Needs  . Financial resource strain: Not on file  . Food insecurity:    Worry: Not on file    Inability: Not on file  . Transportation needs:    Medical: Not on file    Non-medical: Not on file  Tobacco Use  . Smoking status: Never Smoker  . Smokeless tobacco: Never Used  Substance and Sexual Activity  . Alcohol use: No  . Drug use: No  . Sexual activity: Not on file  Lifestyle  . Physical activity:    Days per week: Not on file    Minutes per session: Not on file  . Stress: Not on file  Relationships  . Social connections:    Talks on phone: Not on file    Gets together: Not on file    Attends religious service: Not on file    Active member of club or organization: Not on file    Attends meetings of clubs or organizations: Not on file    Relationship status: Not on file  Other Topics Concern  . Not on file  Social History Narrative   Caffeine: 2-3 diet sodas/day   Lives with wife   1 grown son.   Occupation: Self-employed; Psychologist, forensic   Activity: some golf, limited by knee pain   Diet: good water, daily vegetables    Outpatient Encounter Medications as of 09/09/2017  Medication Sig  . aspirin EC 81 MG tablet Take 81 mg by mouth daily.  Marland Kitchen atorvastatin (LIPITOR) 20 MG tablet Take 1 tablet (20 mg total) by mouth daily.  . Continuous Blood Gluc Receiver (FREESTYLE LIBRE 14 DAY READER) DEVI 1 Units by Does not apply route as directed.  .  Continuous Blood Gluc Sensor (FREESTYLE LIBRE 14 DAY SENSOR) MISC 1 Units by Does not apply route every 14 (fourteen) days.  . DiphenhydrAMINE HCl, Sleep, (UNISOM SLEEPMELTS) 25 MG TBDP Take 25 mg by mouth at bedtime.   Marland Kitchen ibuprofen (ADVIL,MOTRIN) 200 MG tablet Take 600 mg by mouth daily as  needed for moderate pain.  Javier Docker Oil 300 MG CAPS Take 300 mg by mouth daily.  Marland Kitchen losartan (COZAAR) 50 MG tablet Take 1 tablet (50 mg total) by mouth daily.  . metFORMIN (GLUCOPHAGE) 500 MG tablet Take 1 tablet (500 mg total) by mouth 2 (two) times daily with a meal.  . Misc Natural Products (OSTEO BI-FLEX JOINT SHIELD PO) Take 1 capsule by mouth daily.   . Multiple Vitamin (MULTIVITAMIN) tablet Take 1 tablet by mouth daily.  . [DISCONTINUED] sildenafil (VIAGRA) 100 MG tablet Take 0.5-1 tablets (50-100 mg total) by mouth daily as needed for erectile dysfunction.   No facility-administered encounter medications on file as of 09/09/2017.     Activities of Daily Living In your present state of health, do you have any difficulty performing the following activities: 09/09/2017  Hearing? N  Vision? N  Difficulty concentrating or making decisions? N  Walking or climbing stairs? N  Dressing or bathing? N  Doing errands, shopping? N  Preparing Food and eating ? N  Using the Toilet? N  In the past six months, have you accidently leaked urine? N  Do you have problems with loss of bowel control? N  Managing your Medications? N  Managing your Finances? N  Housekeeping or managing your Housekeeping? N  Some recent data might be hidden    Patient Care Team: Ria Bush, MD as PCP - General (Family Medicine) Marchia Bond, MD as Consulting Physician (Orthopedic Surgery) End, Harrell Gave, MD as Consulting Physician (Cardiology) Gatha Mayer, MD as Consulting Physician (Gastroenterology) Macarthur Critchley, OD as Consulting Physician (Optometry)   Assessment:   This is a routine wellness examination for  [place].   Hearing Screening   125Hz  250Hz  500Hz  1000Hz  2000Hz  3000Hz  4000Hz  6000Hz  8000Hz   Right ear:   40 40 40  40    Left ear:   40 40 40  40    Vision Screening Comments: Vision exam in May 2019 with Dr. Macarthur Critchley  Exercise Activities and Dietary recommendations Current Exercise Habits: The patient does not participate in regular exercise at present(yard work ), Exercise limited by: orthopedic condition(s)  Goals    . Patient Stated     Starting 09/09/2017, I will continue to take medications as prescribed.        Fall Risk Fall Risk  09/09/2017 09/03/2016 07/10/2015  Falls in the past year? No No No   Depression Screen PHQ 2/9 Scores 09/09/2017 09/03/2016 07/10/2015  PHQ - 2 Score 0 0 0  PHQ- 9 Score 0 - -    Cognitive Function MMSE - Mini Mental State Exam 09/09/2017 09/03/2016  Orientation to time 5 5  Orientation to Place 5 5  Registration 3 3  Attention/ Calculation 0 0  Recall 3 3  Language- name 2 objects 0 0  Language- repeat 1 1  Language- follow 3 step command 3 3  Language- read & follow direction 0 0  Write a sentence 0 0  Copy design 0 0  Total score 20 20     PLEASE NOTE: A Mini-Cog screen was completed. Maximum score is 20. A value of 0 denotes this part of [place] MMSE was not completed or the patient failed this part of the Mini-Cog screening.   Mini-Cog Screening Orientation to Time - Max 5 pts Orientation to Place - Max 5 pts Registration - Max 3 pts Recall - Max 3 pts Language Repeat - Max 1 pts Language Follow 3 Step Command - Max 3 pts  Immunization History  Administered Date(s) Administered  . Hepatitis B, adult 03/11/2017, 06/14/2017  . Influenza Whole 11/23/2011  . Influenza, High Dose Seasonal PF 11/03/2014, 11/21/2015, 10/01/2016  . Pneumococcal Conjugate-13 07/10/2015  . Pneumococcal Polysaccharide-23 11/21/2015  . Td 03/23/2002  . Tdap 07/10/2015  . Zoster 07/23/2011   Screening Tests Health Maintenance  Topic Date Due   . INFLUENZA VACCINE  09/22/2017  . COLONOSCOPY  10/29/2017  . FOOT EXAM  03/11/2018  . HEMOGLOBIN A1C  03/12/2018  . OPHTHALMOLOGY EXAM  07/02/2018  . DTaP/Tdap/Td (2 - Td) 07/09/2025  . TETANUS/TDAP  07/09/2025  . Hepatitis C Screening  Completed  . PNA vac Low Risk Adult  Completed      Plan:     I have personally reviewed, addressed, and noted the following in the patient's chart:  A. Medical and social history B. Use of alcohol, tobacco or illicit drugs  C. Current medications and supplements D. Functional ability and status E.  Nutritional status F.  Physical activity G. Advance directives H. List of other physicians I.  Hospitalizations, surgeries, and ER visits in previous 12 months J.  [place] to include hearing, vision, cognitive, depression L. Referrals and appointments - none  In addition, I have reviewed and discussed with patient certain preventive protocols, quality metrics, and best practice recommendations. A written personalized care plan for preventive services as well as general preventive health recommendations were provided to patient.  See attached scanned questionnaire for additional information.   Signed,   Lindell Noe, MHA, BS, LPN Health Coach

## 2017-09-09 NOTE — Patient Instructions (Signed)
Mr. Fiorella , Thank you for taking time to come for your Medicare Wellness Visit. I appreciate your ongoing commitment to your health goals. Please review the following plan we discussed and let me know if I can assist you in the future.   These are the goals we discussed: Goals    . Patient Stated     Starting 09/09/2017, I will continue to take medications as prescribed.        This is a list of the screening recommended for you and due dates:  Health Maintenance  Topic Date Due  . Flu Shot  09/22/2017  . Colon Cancer Screening  10/29/2017  . Complete foot exam   03/11/2018  . Hemoglobin A1C  03/12/2018  . Eye exam for diabetics  07/02/2018  . DTaP/Tdap/Td vaccine (2 - Td) 07/09/2025  . Tetanus Vaccine  07/09/2025  .  Hepatitis C: One time screening is recommended by Center for Disease Control  (CDC) for  adults born from [birth year] through 1966.   Completed  . Pneumonia vaccines  Completed   Preventive Care for Adults  A healthy lifestyle and preventive care can promote health and wellness. Preventive health guidelines for adults include the following key practices.  . A routine yearly physical is a good way to check with your health care provider about your health and preventive screening. It is a chance to share any concerns and updates on your health and to receive a thorough exam.  . Visit your dentist for a routine exam and preventive care every 6 months. Brush your teeth twice a day and floss once a day. Good oral hygiene prevents tooth decay and gum disease.  . The frequency of eye exams is based on your age, health, family medical history, use  of contact lenses, and other factors. Follow your health care provider's recommendations for frequency of eye exams.  . Eat a healthy diet. Foods like vegetables, fruits, whole grains, low-fat dairy products, and lean protein foods contain the nutrients you need without too many calories. Decrease your intake of foods high in solid  fats, added sugars, and salt. Eat the right amount of calories for you. Get information about a proper diet from your health care provider, if necessary.  . Regular physical exercise is one of the most important things you can do for your health. Most adults should get at least 150 minutes of moderate-intensity exercise (any activity that increases your heart rate and causes you to sweat) each week. In addition, most adults need muscle-strengthening exercises on 2 or more days a week.  Silver Sneakers may be a benefit available to you. To determine eligibility, you may visit the website: www.silversneakers.com or contact program at 939-884-9961 Mon-Fri between 8AM-8PM.   . Maintain a healthy weight. The body mass index (BMI) is a screening tool to identify possible weight problems. It provides an estimate of body fat based on height and weight. Your health care provider can find your BMI and can help you achieve or maintain a healthy weight.   For adults 20 years and older: ? A BMI below 18.5 is considered underweight. ? A BMI of 18.5 to 24.9 is normal. ? A BMI of 25 to 29.9 is considered overweight. ? A BMI of 30 and above is considered obese.   . Maintain normal blood lipids and cholesterol levels by exercising and minimizing your intake of saturated fat. Eat a balanced diet with plenty of fruit and vegetables. Blood tests for lipids and cholesterol  should begin at age 26 and be repeated every 5 years. If your lipid or cholesterol levels are high, you are over 50, or you are at high risk for heart disease, you may need your cholesterol levels checked more frequently. Ongoing high lipid and cholesterol levels should be treated with medicines if diet and exercise are not working.  . If you smoke, find out from your health care provider how to quit. If you do not use tobacco, please do not start.  . If you choose to drink alcohol, please do not consume more than 2 drinks per day. One drink is  considered to be 12 ounces (355 mL) of beer, 5 ounces (148 mL) of wine, or 1.5 ounces (44 mL) of liquor.  . If you are 57-76 years old, ask your health care provider if you should take aspirin to prevent strokes.  . Use sunscreen. Apply sunscreen liberally and repeatedly throughout the day. You should seek shade when your shadow is shorter than you. Protect yourself by wearing long sleeves, pants, a wide-brimmed hat, and sunglasses year round, whenever you are outdoors.  . Once a month, do a whole body skin exam, using a mirror to look at the skin on your back. Tell your health care provider of new moles, moles that have irregular borders, moles that are larger than a pencil eraser, or moles that have changed in shape or color.

## 2017-09-15 ENCOUNTER — Encounter: Payer: Self-pay | Admitting: Internal Medicine

## 2017-09-16 ENCOUNTER — Ambulatory Visit: Payer: Medicare Other | Admitting: Family Medicine

## 2017-09-21 ENCOUNTER — Encounter: Payer: Medicare Other | Admitting: Family Medicine

## 2017-09-23 ENCOUNTER — Encounter: Payer: Self-pay | Admitting: Family Medicine

## 2017-09-23 ENCOUNTER — Ambulatory Visit (INDEPENDENT_AMBULATORY_CARE_PROVIDER_SITE_OTHER): Payer: Medicare Other | Admitting: Family Medicine

## 2017-09-23 VITALS — BP 120/60 | HR 82 | Temp 97.8°F | Ht 73.5 in | Wt 306.0 lb

## 2017-09-23 DIAGNOSIS — Z0000 Encounter for general adult medical examination without abnormal findings: Secondary | ICD-10-CM

## 2017-09-23 DIAGNOSIS — I10 Essential (primary) hypertension: Secondary | ICD-10-CM | POA: Diagnosis not present

## 2017-09-23 DIAGNOSIS — E119 Type 2 diabetes mellitus without complications: Secondary | ICD-10-CM | POA: Diagnosis not present

## 2017-09-23 MED ORDER — GLUCOSE BLOOD VI STRP: ORAL_STRIP | 3 refills | Status: DC

## 2017-09-23 MED ORDER — ONETOUCH DELICA LANCETS 33G MISC: 1 refills | Status: DC

## 2017-09-23 MED ORDER — METFORMIN HCL 500 MG PO TABS: 500.0000 mg | ORAL_TABLET | Freq: Every day | ORAL | 3 refills | Status: DC

## 2017-09-23 MED ORDER — FREESTYLE LIBRE 14 DAY SENSOR MISC: 1.0000 [IU] | 3 refills | Status: DC

## 2017-09-23 NOTE — Assessment & Plan Note (Addendum)
Chronic, stable. Continue losartan.  

## 2017-09-23 NOTE — Progress Notes (Signed)
I reviewed health advisor's note, was available for consultation, and agree with documentation and plan.  

## 2017-09-23 NOTE — Progress Notes (Signed)
BP 120/60 (BP Location: Left Arm, Patient Position: Sitting, Cuff Size: Large)   Pulse 82   Temp 97.8 F (36.6 C) (Oral)   Ht 6' 1.5" (1.867 m)   Wt (!) 306 lb (138.8 kg)   SpO2 96%   BMI 39.82 kg/m    CC: CPE Subjective:    Patient ID: Dennis Ayers, male    DOB: May 15, 1950, 67 y.o.   MRN: [id number]  HPI: Dennis Ayers is a 67 y.o. male presenting on 09/23/2017 for Annual Exam (Pt 2.)   Saw Katha Cabal last week for medicare wellness visit. Note reviewed. Some L tinnitus. Also noted knot at R axilla and occiput.   DM - has freestyle libre CGM. Some lows - he thinks it's a sensor issue. Brings log showing average cbg 106 over last 2 weeks. Overall doing wonderful with sugar control! Has completely changed diet. 15 lb weight loss in last year.  Preventative: COLONOSCOPY Date: 06/2015 mult TAs, severe diverticulosis, 1 large 2.5cm cecal polyp pending resection Carlean Purl) - 08/2015 large TA, rpt 1 yr Carlean Purl) COLONOSCOPY WITH PROPOFOL 10/29/2016 TA, rpt 1 yr Carlean Purl, Ofilia Neas, MD) Rpt scheduled 11/16/2017.  Prostate cancer screening - discussed. Will screen.  Lung cancer screening - never smoker  AAA screen - never smoker, no fmhx AAA  Flu shot - yearly at [facility] Td 2004, Tdap 06/2015 prevnar 06/2015, pneumovax 10/2015. Zostavax - 2013 Shingrix - pt to get at pharmacy. Hep B - 02/2017, 05/2017, pending 3rd.  Advanced directive discussion - has at home. HCPOA is wife and son. No prolonged life support if terminal illness. Copy in chart 08/2015 Seat belt use discussed  Sunscreen use and skin screen discussed, no changing moles on skin. Non smoker Alcohol - none Dentist - Q6 mo Eye exam - yearly  Caffeine: 2-3 diet sodas/day Lives with wife 1 grown son. Occupation: Self-employed; Psychologist, forensic Activity: some golf, limited by knee pain  Diet: good water, daily vegetables   Relevant past medical, surgical, family and social history reviewed and updated as  indicated. Interim medical history since our last visit reviewed. Allergies and medications reviewed and updated. Outpatient Medications Prior to Visit  Medication Sig Dispense Refill  . aspirin EC 81 MG tablet Take 81 mg by mouth daily.    Marland Kitchen atorvastatin (LIPITOR) 20 MG tablet Take 1 tablet (20 mg total) by mouth daily. 90 tablet 3  . Continuous Blood Gluc Receiver (FREESTYLE LIBRE 14 DAY READER) DEVI 1 Units by Does not apply route as directed. 1 Device 0  . DiphenhydrAMINE HCl, Sleep, (UNISOM SLEEPMELTS) 25 MG TBDP Take 25 mg by mouth at bedtime.     Marland Kitchen ibuprofen (ADVIL,MOTRIN) 200 MG tablet Take 600 mg by mouth daily as needed for moderate pain.    Mairin Lindsley Docker Oil 300 MG CAPS Take 300 mg by mouth daily.    Marland Kitchen losartan (COZAAR) 50 MG tablet Take 1 tablet (50 mg total) by mouth daily. 90 tablet 3  . Misc Natural Products (OSTEO BI-FLEX JOINT SHIELD PO) Take 1 capsule by mouth daily.     . Multiple Vitamin (MULTIVITAMIN) tablet Take 1 tablet by mouth daily.    . Continuous Blood Gluc Sensor (FREESTYLE LIBRE 14 DAY SENSOR) MISC 1 Units by Does not apply route every 14 (fourteen) days. 6 each 3  . glucose blood (ONE TOUCH ULTRA TEST) test strip 1 each by Other route as needed for other. Use as instructed to check blood sugar once a day as  needed. Dx code:  E11.9    . metFORMIN (GLUCOPHAGE) 500 MG tablet Take 1 tablet (500 mg total) by mouth 2 (two) times daily with a meal. 180 tablet 1  . ONETOUCH DELICA LANCETS 20B MISC 1 each by Does not apply route. Use as directed to check blood sugar once daily as needed.  Dx code: E11.9     No facility-administered medications prior to visit.      Per HPI unless specifically indicated in ROS section below Review of Systems  Constitutional: Negative for activity change, appetite change, chills, fatigue, fever and unexpected weight change.  HENT: Negative for hearing loss.   Eyes: Negative for visual disturbance.  Respiratory: Negative for cough, chest  tightness, shortness of breath and wheezing.   Cardiovascular: Negative for chest pain, palpitations and leg swelling.  Gastrointestinal: Negative for abdominal distention, abdominal pain, blood in stool, constipation, diarrhea, nausea and vomiting.  Genitourinary: Negative for difficulty urinating and hematuria.  Musculoskeletal: Negative for arthralgias, myalgias and neck pain.  Skin: Negative for rash.  Neurological: Negative for dizziness, seizures, syncope and headaches.  Hematological: Negative for adenopathy. Does not bruise/bleed easily.  Psychiatric/Behavioral: Negative for dysphoric mood. The patient is not nervous/anxious.        Objective:    BP 120/60 (BP Location: Left Arm, Patient Position: Sitting, Cuff Size: Large)   Pulse 82   Temp 97.8 F (36.6 C) (Oral)   Ht 6' 1.5" (1.867 m)   Wt (!) 306 lb (138.8 kg)   SpO2 96%   BMI 39.82 kg/m   Wt Readings from Last 3 Encounters:  09/23/17 (!) 306 lb (138.8 kg)  09/09/17 (!) 306 lb 8 oz (139 kg)  09/07/17 (!) 308 lb (139.7 kg)    Physical Exam  Constitutional: He is oriented to person, place, and time. He appears well-developed and well-nourished. No distress.  HENT:  Head: Normocephalic and atraumatic.  Right Ear: Hearing, tympanic membrane, external ear and ear canal normal.  Left Ear: Hearing, tympanic membrane, external ear and ear canal normal.  Nose: Nose normal.  Mouth/Throat: Uvula is midline, oropharynx is clear and moist and mucous membranes are normal. No oropharyngeal exudate, posterior oropharyngeal edema or posterior oropharyngeal erythema.  Eyes: Pupils are equal, round, and reactive to light. Conjunctivae and EOM are normal. No scleral icterus.  Neck: Normal range of motion. Neck supple. Carotid bruit is not present. No thyromegaly present.  Cardiovascular: Normal rate, regular rhythm, normal heart sounds and intact distal pulses.  No murmur heard. Pulses:      Radial pulses are 2+ on the right side,  and 2+ on the left side.  Pulmonary/Chest: Effort normal and breath sounds normal. No respiratory distress. He has no wheezes. He has no rales.  Abdominal: Soft. Bowel sounds are normal. He exhibits no distension and no mass. There is no tenderness. There is no rebound and no guarding.  Genitourinary: Rectum normal and prostate normal. Rectal exam shows no external hemorrhoid, no internal hemorrhoid, no fissure, no mass, no tenderness and anal tone normal. Prostate is not enlarged (15gm) and not tender.  Musculoskeletal: Normal range of motion. He exhibits no edema.  Lymphadenopathy:    He has no cervical adenopathy.  No LAD appreciated  Neurological: He is alert and oriented to person, place, and time.  CN grossly intact, station and gait intact  Skin: Skin is warm and dry. No rash noted.  Psychiatric: He has a normal mood and affect. His behavior is normal. Judgment and thought content  normal.  Nursing note and vitals reviewed.  Results for orders placed or performed in visit on 09/09/17  Hemoglobin A1c  Result Value Ref Range   Hgb A1c MFr Bld 5.3 4.6 - 6.5 %  Comprehensive metabolic panel  Result Value Ref Range   Sodium 140 135 - 145 mEq/L   Potassium 4.3 3.5 - 5.1 mEq/L   Chloride 105 96 - 112 mEq/L   CO2 29 19 - 32 mEq/L   Glucose, Bld 109 (H) 70 - 99 mg/dL   BUN 25 (H) 6 - 23 mg/dL   Creatinine, Ser 0.96 0.40 - 1.50 mg/dL   Total Bilirubin 0.7 0.2 - 1.2 mg/dL   Alkaline Phosphatase 53 39 - 117 U/L   AST 13 0 - 37 U/L   ALT 20 0 - 53 U/L   Total Protein 7.0 6.0 - 8.3 g/dL   Albumin 4.5 3.5 - 5.2 g/dL   Calcium 9.3 8.4 - 10.5 mg/dL   GFR 82.82 >60.00 mL/min  Lipid panel  Result Value Ref Range   Cholesterol 131 0 - 200 mg/dL   Triglycerides 46.0 0.0 - 149.0 mg/dL   HDL 49.20 >39.00 mg/dL   VLDL 9.2 0.0 - 40.0 mg/dL   LDL Cholesterol 73 0 - 99 mg/dL   Total CHOL/HDL Ratio 3    NonHDL 82.01   CBC with Differential/Platelet  Result Value Ref Range   WBC 4.9 4.0 - 10.5  K/uL   RBC 5.10 4.22 - 5.81 Mil/uL   Hemoglobin 16.6 13.0 - 17.0 g/dL   HCT 47.9 39.0 - 52.0 %   MCV 94.0 78.0 - 100.0 fl   MCHC 34.6 30.0 - 36.0 g/dL   RDW 13.3 11.5 - 15.5 %   Platelets 226.0 150.0 - 400.0 K/uL   Neutrophils Relative % 49.8 43.0 - 77.0 %   Lymphocytes Relative 38.1 12.0 - 46.0 %   Monocytes Relative 8.6 3.0 - 12.0 %   Eosinophils Relative 2.8 0.0 - 5.0 %   Basophils Relative 0.7 0.0 - 3.0 %   Neutro Abs 2.4 1.4 - 7.7 K/uL   Lymphs Abs 1.9 0.7 - 4.0 K/uL   Monocytes Absolute 0.4 0.1 - 1.0 K/uL   Eosinophils Absolute 0.1 0.0 - 0.7 K/uL   Basophils Absolute 0.0 0.0 - 0.1 K/uL  PSA, Medicare  Result Value Ref Range   PSA 2.06 0.10 - 4.00 ng/ml      Assessment & Plan:   Problem List Items Addressed This Visit    Severe obesity (BMI 35.0-39.9) with comorbidity ([place])    Congratulated on ongoing weight loss efforts - 14 lbs in last year. Encouraged continued healthy diet and lifestyle changes for ongoing sustainable weight loss.       Relevant Medications   metFORMIN (GLUCOPHAGE) 500 MG tablet   Health maintenance examination - Primary    Preventative protocols reviewed and updated unless pt declined. Discussed healthy diet and lifestyle.       Essential hypertension    Chronic, stable. Continue losartan.       Controlled type 2 diabetes mellitus without complication, without long-term current use of insulin (HCC)    Chronic, stable. Wonderful control. Checks sugars regularly with freestyle libre CGM. Selinda Eon out of pocket for this as insurance does not cover. Given wonderful control and some low readings, will decrease metformin to 500mg  once daily. RTC 6 mo DM f/u visit.       Relevant Medications   metFORMIN (GLUCOPHAGE) 500 MG tablet  Meds ordered this encounter  Medications  . Continuous Blood Gluc Sensor (FREESTYLE LIBRE 14 DAY SENSOR) MISC    Sig: 1 Units by Does not apply route every 14 (fourteen) days.    Dispense:  6 each    Refill:  3  .  glucose blood (ONE TOUCH ULTRA TEST) test strip    Sig: Use as instructed to check blood sugar once a day as needed. Dx code:  E11.9    Dispense:  25 each    Refill:  3  . metFORMIN (GLUCOPHAGE) 500 MG tablet    Sig: Take 1 tablet (500 mg total) by mouth daily with breakfast.    Dispense:  90 tablet    Refill:  3    Note new sig  . ONETOUCH DELICA LANCETS 65K MISC    Sig: Use as directed to check blood sugar once daily as needed.  Dx code: E11.9    Dispense:  100 each    Refill:  1   No orders of the defined types were placed in this encounter.   Follow up plan: Return in about 6 months (around 03/26/2018) for follow up visit.  Ria Bush, MD

## 2017-09-23 NOTE — Patient Instructions (Addendum)
Sugars are doing wonderful! Let's decrease metformin to 500mg  once daily with breakfast. Continue healthy diet as up to now. If interested, check with pharmacy about new 2 shot shingles series (shingrix).  Return in 6 months for follow up visit.   Health Maintenance, Male A healthy lifestyle and preventive care is important for your health and wellness. Ask your health care provider about what schedule of regular examinations is right for you. What should I know about weight and diet? Eat a Healthy Diet  Eat plenty of vegetables, fruits, whole grains, low-fat dairy products, and lean protein.  Do not eat a lot of foods high in solid fats, added sugars, or salt.  Maintain a Healthy Weight Regular exercise can help you achieve or maintain a healthy weight. You should:  Do at least 150 minutes of exercise each week. The exercise should increase your heart rate and make you sweat (moderate-intensity exercise).  Do strength-training exercises at least twice a week.  Watch Your Levels of Cholesterol and Blood Lipids  Have your blood tested for lipids and cholesterol every 5 years starting at 68 years of age. If you are at high risk for heart disease, you should start having your blood tested when you are 68 years old. You may need to have your cholesterol levels checked more often if: ? Your lipid or cholesterol levels are high. ? You are older than 68 years of age. ? You are at high risk for heart disease.  What should I know about cancer screening? Many types of cancers can be detected early and may often be prevented. Lung Cancer  You should be screened every year for lung cancer if: ? You are a current smoker who has smoked for at least 30 years. ? You are a former smoker who has quit within the past 15 years.  Talk to your health care provider about your screening options, when you should start screening, and how often you should be screened.  Colorectal Cancer  Routine  colorectal cancer screening usually begins at 68 years of age and should be repeated every 5-10 years until you are 68 years old. You may need to be screened more often if early forms of precancerous polyps or small growths are found. Your health care provider may recommend screening at an earlier age if you have risk factors for colon cancer.  Your health care provider may recommend using home test kits to check for hidden blood in the stool.  A small camera at the end of a tube can be used to examine your colon (sigmoidoscopy or colonoscopy). This checks for the earliest forms of colorectal cancer.  Prostate and Testicular Cancer  Depending on your age and overall health, your health care provider may do certain tests to screen for prostate and testicular cancer.  Talk to your health care provider about any symptoms or concerns you have about testicular or prostate cancer.  Skin Cancer  Check your skin from head to toe regularly.  Tell your health care provider about any new moles or changes in moles, especially if: ? There is a change in a mole's size, shape, or color. ? You have a mole that is larger than a pencil eraser.  Always use sunscreen. Apply sunscreen liberally and repeat throughout the day.  Protect yourself by wearing long sleeves, pants, a wide-brimmed hat, and sunglasses when outside.  What should I know about heart disease, diabetes, and high blood pressure?  If you are 80-11 years of age,  have your blood pressure checked every 3-5 years. If you are 68 years of age or older, have your blood pressure checked every year. You should have your blood pressure measured twice-once when you are at a hospital or clinic, and once when you are not at a hospital or clinic. Record the average of the two measurements. To check your blood pressure when you are not at a hospital or clinic, you can use: ? An automated blood pressure machine at a pharmacy. ? A home blood pressure  monitor.  Talk to your health care provider about your target blood pressure.  If you are between 51-54 years old, ask your health care provider if you should take aspirin to prevent heart disease.  Have regular diabetes screenings by checking your fasting blood sugar level. ? If you are at a normal weight and have a low risk for diabetes, have this test once every three years after the age of 47. ? If you are overweight and have a high risk for diabetes, consider being tested at a younger age or more often.  A one-time screening for abdominal aortic aneurysm (AAA) by ultrasound is recommended for men aged 66-75 years who are current or former smokers. What should I know about preventing infection? Hepatitis B If you have a higher risk for hepatitis B, you should be screened for this virus. Talk with your health care provider to find out if you are at risk for hepatitis B infection. Hepatitis C Blood testing is recommended for:  Everyone born from [birth year] through 1966.  Anyone with known risk factors for hepatitis C.  Sexually Transmitted Diseases (STDs)  You should be screened each year for STDs including gonorrhea and chlamydia if: ? You are sexually active and are younger than 68 years of age. ? You are older than 67 years of age and your health care provider tells you that you are at risk for this type of infection. ? Your sexual activity has changed since you were last screened and you are at an increased risk for chlamydia or gonorrhea. Ask your health care provider if you are at risk.  Talk with your health care provider about whether you are at high risk of being infected with HIV. Your health care provider may recommend a prescription medicine to help prevent HIV infection.  What else can I do?  Schedule regular health, dental, and eye exams.  Stay current with your vaccines (immunizations).  Do not use any tobacco products, such as cigarettes, chewing tobacco, and  e-cigarettes. If you need help quitting, ask your health care provider.  Limit alcohol intake to no more than 2 drinks per day. One drink equals 12 ounces of beer, 5 ounces of wine, or 1 ounces of hard liquor.  Do not use street drugs.  Do not share needles.  Ask your health care provider for help if you need support or information about quitting drugs.  Tell your health care provider if you often feel depressed.  Tell your health care provider if you have ever been abused or do not feel safe at home. This information is not intended to replace advice given to you by your health care provider. Make sure you discuss any questions you have with your health care provider. Document Released: 08/07/2007 Document Revised: 10/08/2015 Document Reviewed: 11/12/2014 Elsevier Interactive Patient Education  [employer].

## 2017-09-23 NOTE — Assessment & Plan Note (Signed)
Preventative protocols reviewed and updated unless pt declined. Discussed healthy diet and lifestyle.  

## 2017-09-23 NOTE — Assessment & Plan Note (Signed)
Congratulated on ongoing weight loss efforts - 14 lbs in last year. Encouraged continued healthy diet and lifestyle changes for ongoing sustainable weight loss.

## 2017-09-23 NOTE — Assessment & Plan Note (Signed)
Chronic, stable. Wonderful control. Checks sugars regularly with freestyle libre CGM. Selinda Eon out of pocket for this as insurance does not cover. Given wonderful control and some low readings, will decrease metformin to 500mg  once daily. RTC 6 mo DM f/u visit.

## 2017-10-18 ENCOUNTER — Ambulatory Visit (INDEPENDENT_AMBULATORY_CARE_PROVIDER_SITE_OTHER): Payer: Medicare Other | Admitting: *Deleted

## 2017-10-18 DIAGNOSIS — Z23 Encounter for immunization: Secondary | ICD-10-CM | POA: Diagnosis not present

## 2017-11-02 ENCOUNTER — Ambulatory Visit (AMBULATORY_SURGERY_CENTER): Payer: Self-pay | Admitting: *Deleted

## 2017-11-02 ENCOUNTER — Encounter: Payer: Self-pay | Admitting: Internal Medicine

## 2017-11-02 VITALS — Ht 73.5 in | Wt 309.2 lb

## 2017-11-02 DIAGNOSIS — Z86010 Personal history of colonic polyps: Secondary | ICD-10-CM

## 2017-11-02 NOTE — Progress Notes (Signed)
Patient denies any allergies to eggs or soy. Patient denies any problems with anesthesia/sedation. Patient denies any oxygen use at home. Patient denies taking any diet/weight loss medications or blood thinners. EMMI education offered, pt declined. 2 day prep given due to prep results last colon.

## 2017-11-16 ENCOUNTER — Other Ambulatory Visit: Payer: Self-pay | Admitting: Family Medicine

## 2017-11-16 ENCOUNTER — Ambulatory Visit (AMBULATORY_SURGERY_CENTER): Payer: Medicare Other | Admitting: Internal Medicine

## 2017-11-16 ENCOUNTER — Encounter: Payer: Self-pay | Admitting: Internal Medicine

## 2017-11-16 VITALS — BP 116/72 | HR 69 | Temp 98.9°F | Resp 13 | Ht 73.0 in | Wt 309.0 lb

## 2017-11-16 DIAGNOSIS — D124 Benign neoplasm of descending colon: Secondary | ICD-10-CM

## 2017-11-16 DIAGNOSIS — D123 Benign neoplasm of transverse colon: Secondary | ICD-10-CM | POA: Diagnosis not present

## 2017-11-16 DIAGNOSIS — Z860100 Personal history of colon polyps, unspecified: Secondary | ICD-10-CM

## 2017-11-16 DIAGNOSIS — Q438 Other specified congenital malformations of intestine: Secondary | ICD-10-CM | POA: Insufficient documentation

## 2017-11-16 DIAGNOSIS — D120 Benign neoplasm of cecum: Secondary | ICD-10-CM

## 2017-11-16 DIAGNOSIS — D122 Benign neoplasm of ascending colon: Secondary | ICD-10-CM

## 2017-11-16 HISTORY — DX: Other specified congenital malformations of intestine: Q43.8

## 2017-11-16 MED ORDER — SODIUM CHLORIDE 0.9 % IV SOLN: 500.0000 mL | Freq: Once | INTRAVENOUS | Status: DC

## 2017-11-16 NOTE — Progress Notes (Signed)
Report given to PACU, vss 

## 2017-11-16 NOTE — Progress Notes (Signed)
Called to room to assist during endoscopic procedure.  Patient ID and intended procedure confirmed with present staff. Received instructions for my participation in the procedure from the performing physician.  

## 2017-11-16 NOTE — Op Note (Signed)
Dennis Ayers: Dennis Ayers Procedure Date: 11/16/2017 3:30 PM MRN: [id number] Endoscopist: Gatha Mayer , MD Age: 67 Referring MD:  Date of Birth: 1950/05/29 Gender: Male Account #: [id number] Procedure:                Colonoscopy Indications:              Surveillance: History of numerous (> 10) adenomas                            on last colonoscopy (< 3 yrs), Surveillance:                            Piecemeal removal of large sessile adenoma last                            colonoscopy (< 3 yrs) Medicines:                Propofol per Anesthesia, Monitored Anesthesia Care Procedure:                Pre-Anesthesia Assessment:                           - Prior to the procedure, a History and Physical                            was performed, and patient medications and                            allergies were reviewed. The patient's tolerance of                            previous anesthesia was also reviewed. The risks                            and benefits of the procedure and the sedation                            options and risks were discussed with the patient.                            All questions were answered, and informed consent                            was obtained. Prior Anticoagulants: The patient has                            taken no previous anticoagulant or antiplatelet                            agents. ASA Grade Assessment: III - A patient with                            severe systemic disease. After reviewing the risks  and benefits, the patient was deemed in                            satisfactory condition to undergo the procedure.                           After obtaining informed consent, the colonoscope                            was passed under direct vision. Throughout the                            procedure, the patient's blood pressure, pulse, and                            oxygen saturations  were monitored continuously. The                            Model CF-HQ190L [id number]) scope was introduced                            through the anus and advanced to the the cecum,                            identified by appendiceal orifice and ileocecal                            valve. The colonoscopy was performed with                            difficulty due to a redundant colon and significant                            looping. Successful completion of the procedure was                            aided by applying abdominal pressure AND APPLYING                            AN ABDOMINAL BINDER. The patient tolerated the                            procedure well. Scope In: 3:43:28 PM Scope Out: 4:08:01 PM Scope Withdrawal Time: 0 hours 18 minutes 58 seconds  Total Procedure Duration: 0 hours 24 minutes 33 seconds  Findings:                 The perianal and digital rectal examinations were                            normal. Pertinent negatives include normal prostate                            (size, shape, and consistency).  Five flat and sessile polyps were found in the                            descending colon, transverse colon, ascending colon                            and cecum. The polyps were diminutive in size.                            These polyps were removed with a cold snare.                            Resection and retrieval were complete. Verification                            of patient identification for the specimen was                            done. Estimated blood loss was minimal.                           Multiple diverticula were found in the left colon.                           The colon (entire examined portion) was                            significantly redundant. Advancing the scope                            required using manual pressure with abdominal                            binder.                           The  exam was otherwise without abnormality on                            direct and retroflexion views. Complications:            No immediate complications. Estimated Blood Loss:     Estimated blood loss was minimal. Impression:               - Five diminutive polyps in the descending colon,                            in the transverse colon, in the ascending colon and                            in the cecum, removed with a cold snare. Resected                            and retrieved.                           -  Diverticulosis in the left colon.                           - Redundant colon.                           - The examination was otherwise normal on direct                            and retroflexion views. Recommendation:           - Patient has a contact number available for                            emergencies. The signs and symptoms of potential                            delayed complications were discussed with the                            patient. Return to normal activities tomorrow.                            Written discharge instructions were provided to the                            patient.                           - Resume previous diet.                           - Continue present medications.                           - Repeat colonoscopy is recommended for                            surveillance. The colonoscopy date will be                            determined after pathology results from today's                            exam become available for review.                           -                           HE HAS HAD LIFETIME 22 POLYPS NOW SO WILL REFER TO                            GENETICS (ONE WAS LARGE CECAL TV ADENOMA AND MOM                            HAD OVARIAN CANCER) Glendell Docker  Simonne Maffucci, MD 11/16/2017 4:26:03 PM This report has been signed electronically.

## 2017-11-16 NOTE — Progress Notes (Signed)
Pt's states no medical or surgical changes since previsit or office visit. 

## 2017-11-16 NOTE — Patient Instructions (Addendum)
I found and removed 5 small polyps - all look benign.  I will let you know pathology results and when to have another routine colonoscopy by mail and/or My Chart. Will try for 3 years BUT given the number of polyps you have had removed I think you should see the genetics counselor to see if tyou have a genetic abnormality that could influence how you are followed and it could possibly imapct other relatives.  My office will arrange that.  I appreciate the opportunity to care for you. Gatha Mayer, MD, FACG  YOU HAD AN ENDOSCOPIC PROCEDURE TODAY AT [facility]:   Refer to the procedure report that was given to you for any specific questions about what was found during the examination.  If the procedure report does not answer your questions, please call your gastroenterologist to clarify.  If you requested that your care partner not be given the details of your procedure findings, then the procedure report has been included in a sealed envelope for you to review at your convenience later.  YOU SHOULD EXPECT: Some feelings of bloating in the abdomen. Passage of more gas than usual.  Walking can help get rid of the air that was put into your GI tract during the procedure and reduce the bloating. If you had a lower endoscopy (such as a colonoscopy or flexible sigmoidoscopy) you may notice spotting of blood in your stool or on the toilet paper. If you underwent a bowel prep for your procedure, you may not have a normal bowel movement for a few days.  Please Note:  You might notice some irritation and congestion in your nose or some drainage.  This is from the oxygen used during your procedure.  There is no need for concern and it should clear up in a day or so.  SYMPTOMS TO REPORT IMMEDIATELY:   Following lower endoscopy (colonoscopy or flexible sigmoidoscopy):  Excessive amounts of blood in the stool  Significant tenderness or worsening of abdominal pains  Swelling of  the abdomen that is new, acute  Fever of 100F or higher  For urgent or emergent issues, a gastroenterologist can be reached at any hour by calling [phone].   DIET:  We do recommend a small meal at first, but then you may proceed to your regular diet.  Drink plenty of fluids but you should avoid alcoholic beverages for 24 hours.  ACTIVITY:  You should plan to take it easy for the rest of today and you should NOT DRIVE or use heavy machinery until tomorrow (because of the sedation medicines used during the test).    FOLLOW UP: Our staff will call the number listed on your records the next business day following your procedure to check on you and address any questions or concerns that you may have regarding the information given to you following your procedure. If we do not reach you, we will leave a message.  However, if you are feeling well and you are not experiencing any problems, there is no need to return our call.  We will assume that you have returned to your regular daily activities without incident.  If any biopsies were taken you will be contacted by phone or by letter within the next 1-3 weeks.  Please call us at [phone] if you have not heard about the biopsies in 3 weeks.    SIGNATURES/CONFIDENTIALITY: You and/or your care partner have signed paperwork which will be entered into your electronic  medical record.  These signatures attest to the fact that that the information above on your After Visit Summary has been reviewed and is understood.  Full responsibility of the confidentiality of this discharge information lies with you and/or your care-partner.

## 2017-11-17 ENCOUNTER — Telehealth: Payer: Self-pay

## 2017-11-17 NOTE — Telephone Encounter (Signed)
  Follow up Call-  Call back number 11/16/2017 07/22/2015  Post procedure Call Back phone  # [phone]  Permission to leave phone message Yes Yes  Some recent data might be hidden     Patient questions:  Do you have a fever, pain , or abdominal swelling? No. Pain Score  0 *  Have you tolerated food without any problems? Yes.    Have you been able to return to your normal activities? Yes.    Do you have any questions about your discharge instructions: Diet   No. Medications  No. Follow up visit  No.  Do you have questions or concerns about your Care? No.  Actions: * If pain score is 4 or above: No action needed, pain <4.

## 2017-11-21 ENCOUNTER — Other Ambulatory Visit: Payer: Self-pay

## 2017-11-21 DIAGNOSIS — D369 Benign neoplasm, unspecified site: Secondary | ICD-10-CM

## 2017-11-21 DIAGNOSIS — Z86010 Personal history of colonic polyps: Secondary | ICD-10-CM

## 2017-11-22 HISTORY — PX: COLONOSCOPY: SHX174

## 2017-11-23 ENCOUNTER — Other Ambulatory Visit: Payer: Self-pay | Admitting: *Deleted

## 2017-11-23 MED ORDER — LOSARTAN POTASSIUM 100 MG PO TABS: 50.0000 mg | ORAL_TABLET | Freq: Every day | ORAL | 2 refills | Status: DC

## 2017-11-23 NOTE — Telephone Encounter (Signed)
Received request from [facility] that losartan 50 mg on back order. Called pharmacy and they have losartan 25 mg and 100 mg tablets. Discussed with Dr End and he is agreeable to send in rx how patient prefers. S/w patient. He is fine with splitting the 100 mg tablet. Rx for losartan 100 mg - Take 1/2 tablet (50 mg) by mouth once a day.

## 2017-11-24 ENCOUNTER — Encounter: Payer: Self-pay | Admitting: Internal Medicine

## 2017-11-24 NOTE — Progress Notes (Signed)
Mix adenomas ssps 5 total Recall 2 years (awaiting genetics eval)

## 2017-11-28 ENCOUNTER — Telehealth: Payer: Self-pay | Admitting: Licensed Clinical Social Worker

## 2017-11-28 ENCOUNTER — Encounter: Payer: Self-pay | Admitting: Licensed Clinical Social Worker

## 2017-11-28 NOTE — Telephone Encounter (Signed)
New referral received from [facility] for genetic counseling. Pt cld back to schedule a genetic counseling appt on 11/11 at 9am. Pt aware to arrive 15 minutes earlly. Letter mailed.

## 2017-11-28 NOTE — Telephone Encounter (Signed)
error 

## 2017-12-04 ENCOUNTER — Encounter: Payer: Self-pay | Admitting: Family Medicine

## 2017-12-08 ENCOUNTER — Other Ambulatory Visit
Admission: RE | Admit: 2017-12-08 | Discharge: 2017-12-08 | Disposition: A | Discharge status: Home / Self Care | Payer: Medicare Other | Source: Ambulatory Visit | Attending: Internal Medicine | Admitting: Internal Medicine

## 2017-12-08 DIAGNOSIS — Z13220 Encounter for screening for lipoid disorders: Secondary | ICD-10-CM | POA: Diagnosis not present

## 2017-12-08 DIAGNOSIS — I10 Essential (primary) hypertension: Secondary | ICD-10-CM | POA: Diagnosis not present

## 2017-12-08 LAB — LIPID PANEL
CHOLESTEROL: 131 mg/dL (ref 0–200)
HDL: 49 mg/dL (ref >40)
LDL Cholesterol: 77 mg/dL (ref 0–99)
TRIGLYCERIDES: 27 mg/dL (ref <150)
Total CHOL/HDL Ratio: 2.7 RATIO
VLDL: 5 mg/dL (ref 0–40)

## 2017-12-08 LAB — ALT: ALT: 23 U/L (ref 0–44)

## 2017-12-14 ENCOUNTER — Telehealth: Payer: Self-pay | Admitting: *Deleted

## 2017-12-14 DIAGNOSIS — I10 Essential (primary) hypertension: Secondary | ICD-10-CM

## 2017-12-14 DIAGNOSIS — Z13220 Encounter for screening for lipoid disorders: Secondary | ICD-10-CM

## 2017-12-14 DIAGNOSIS — Z79899 Other long term (current) drug therapy: Secondary | ICD-10-CM

## 2017-12-14 MED ORDER — ATORVASTATIN CALCIUM 40 MG PO TABS: 20.0000 mg | ORAL_TABLET | Freq: Every day | ORAL | 3 refills | Status: DC

## 2017-12-14 NOTE — Telephone Encounter (Signed)
Results called to pt. Pt verbalized understanding of results, to increase atorvastatin to 40 mg daily and repeat lab work in [facility] Rx sent to pharmacy. He was appreciative.

## 2017-12-14 NOTE — Telephone Encounter (Signed)
-----   Message from Nelva Bush, MD sent at 12/09/2017  1:49 PM EDT ----- Please let Dennis Ayers know that his LDL is stable and still slightly above our goal of < 70.  If he is tolerating atorvastatin, I recommend increasing this to 40 mg daily with repeat lipid panel and ALT in 3 months.

## 2017-12-21 ENCOUNTER — Other Ambulatory Visit: Payer: Self-pay | Admitting: Family Medicine

## 2018-01-02 ENCOUNTER — Inpatient Hospital Stay: Payer: Medicare Other

## 2018-01-02 ENCOUNTER — Encounter: Payer: Self-pay | Admitting: Licensed Clinical Social Worker

## 2018-01-02 ENCOUNTER — Inpatient Hospital Stay: Payer: Medicare Other | Attending: Genetic Counselor | Admitting: Licensed Clinical Social Worker

## 2018-01-02 DIAGNOSIS — Z8041 Family history of malignant neoplasm of ovary: Secondary | ICD-10-CM | POA: Diagnosis not present

## 2018-01-02 DIAGNOSIS — Z86010 Personal history of colonic polyps: Secondary | ICD-10-CM | POA: Diagnosis not present

## 2018-01-02 NOTE — Progress Notes (Signed)
REFERRING PROVIDER: Gatha Mayer, MD [address], [place] [address]  PRIMARY PROVIDER:  Ria Bush, MD  PRIMARY REASON FOR VISIT:  1. Personal history of colonic polyps-serrated adenomas and tubular adenoma   2. Family history of ovarian cancer      HISTORY OF PRESENT ILLNESS:   Dennis Ayers, a 67 y.o. male, was seen for a [place] cancer genetics consultation at the request of Dr. Carlean Purl due to a personal history of colon polyps. Dennis Ayers presents to clinic today to discuss the possibility of a hereditary predisposition to cancer, genetic testing, and to further clarify his future cancer risks, as well as potential cancer risks for family members.    Dennis Ayers is a 67 y.o. male with no personal history of cancer.   He has had colonoscopies in 2013, 2017, 2018, and 2019 that have revealed adenomatous polyps, for a cumulative total of 22 adenomas.  He has had prostate screening at his physicals.    Past Medical History:  Diagnosis Date  . Benign neoplasm of cecum - 25 mm polyp 08/06/2015  . Coronary artery calcification   . Diabetes mellitus without complication ([place])   . DJD (degenerative joint disease) of knee    [place] steroid injection (01/2014)  . Family history of ovarian cancer   . Hyperglycemia   . Hypertension   . Jaundice    with icterus, when very young, treated and resolved  . Knee pain   . Morbid obesity ([place])   . OSA (obstructive sleep apnea)    mild to mod, working on weight loss, no cpap needed per test  . Osteoarthritis of knee 07/23/2011   blat s/p L TKR ([place])  . Redundant colon 11/16/2017  . Severe obesity (BMI >= 40) ([place]) 02/15/2008  . Sleep apnea    does not have a cpap  . Viral hepatitis 8th grade   has had jaundice in past, unsure of cause ?Hep A    Past Surgical History:  Procedure Laterality Date  . CHOLECYSTECTOMY  1998  . COLONOSCOPY  08/20/2011   4 adenomatous polyps - rpt 3 yrs Gatha Mayer)  . COLONOSCOPY   07-22-15   mult TAs, severe diverticulosis, one large 2.5cm cecal polyp pending resection Carlean Purl)  . COLONOSCOPY N/A 09/10/2015   large TA, rpt 1 yr Gatha Mayer, MD)  . COLONOSCOPY  11/2017   7 TAs, 3 SSPs, diverticulosis, rpt 2 yrs Carlean Purl)  . COLONOSCOPY WITH PROPOFOL N/A 10/29/2016   TA, rpt 1 yr Carlean Purl, Ofilia Neas, MD)  . EYE SURGERY Right    laser  . HOT HEMOSTASIS N/A 10/29/2016   Procedure: HOT HEMOSTASIS (ARGON PLASMA COAGULATION/BICAP);  Surgeon: Gatha Mayer, MD;  Location: [facility];  Service: Endoscopy;  Laterality: N/A;  . TONSILLECTOMY    . TOTAL KNEE ARTHROPLASTY Left 07/25/2012   Surgeon: Johnny Bridge, MD  . 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 Alphonzo Severance    Social History   Socioeconomic History  . Marital status: Married    Spouse name: Not on file  . Number of children: 1  . Years of education: Not on file  . Highest education level: Not on file  Occupational History  . Occupation: Self-employed  Social Needs  . Financial resource strain: Not on file  . Food insecurity:    Worry: Not on file    Inability: Not on file  . Transportation needs:    Medical: Not on file  Non-medical: Not on file  Tobacco Use  . Smoking status: Never Smoker  . Smokeless tobacco: Never Used  Substance and Sexual Activity  . Alcohol use: No  . Drug use: No  . Sexual activity: Not on file  Lifestyle  . Physical activity:    Days per week: Not on file    Minutes per session: Not on file  . Stress: Not on file  Relationships  . Social connections:    Talks on phone: Not on file    Gets together: Not on file    Attends religious service: Not on file    Active member of club or organization: Not on file    Attends meetings of clubs or organizations: Not on file    Relationship status: Not on file  Other Topics Concern  . Not on file  Social History Narrative   Caffeine: 2-3 diet sodas/day   Lives with wife   1 grown son.   Occupation: Self-employed; Metallurgist   Activity: some golf, limited by knee pain   Diet: good water, daily vegetables     FAMILY HISTORY:  We obtained a detailed, 4-generation family history.  Significant diagnoses are listed below: Family History  Problem Relation Age of Onset  . Ovarian cancer Mother   . Ulcers Mother        stomach  . Coronary artery disease Father 76       CABG X4  . Hypertension Father   . Stroke Father 83  . Heart attack Father 56  . Heart attack Sister 43  . Heart Problems Paternal Aunt   . Heart attack Paternal Uncle   . Heart attack Paternal Aunt   . Heart attack Paternal Uncle   . Diabetes Neg Hx   . Prostate cancer Neg Hx   . Breast cancer Neg Hx   . Colon cancer Neg Hx   . Depression Neg Hx   . Alcohol abuse Neg Hx   . Colon polyps Neg Hx    Dennis Ayers has one son, Dennis Ayers, who is 9 with no cancer history. He has two granddaughters. Dennis Ayers has a sister, [name], who is 76. He also had a half-brother through his mother, but he does not have information about him and he is deceased. She had a heart attack a few years ago, no history of cancer. Dennis Ayers believes she may have had genetic testing at some point and will let me know if he finds out.  Dennis Ayers mother Dennis Ayers was diagnosed with ovarian cancer at 39 and died at 64. She had two sisters who have passed away. Dennis Ayers has limited information about his maternal aunts, cousins and grandparents but reports no history of cancer/polyps to his knowledge in these individuals. Dennis Ayers maternal grandparents passed away in their 77's-80's.   [name] father Dennis Ayers died of a stroke at 26. He had two brothers who have passed away of heart attacks in their 63's. He also had 5 sisters. Dennis Ayers has limited information about his paternal aunts/uncles/cousins/grandparents, but does not believe there is any history of cancers/polyps. He has no information about his paternal grandfather, his paternal  grandmother died in her 36's.   Dennis Ayers is  of previous family history of genetic testing for hereditary cancer risks. Patient's maternal ancestors are of Caucasian descent, and paternal ancestors are of Caucasian descent. There is no reported Ashkenazi Jewish ancestry. There is no known consanguinity.  GENETIC COUNSELING ASSESSMENT: Denzal  Viona Gilmore Osuna is a 67 y.o. male with a personal and family history which is somewhat suggestive of a Hereditary Cancer Predisposition Syndrome. We, therefore, discussed and recommended the following at today's visit.   DISCUSSION: We discussed that polyps in general are common, however, most people have fewer than 5 lifetime polyps.  When an individual has 10 or more polyps we become concerned about an underlying polyposis syndrome.  The most common hereditary polyposis syndromes are caused by problems in the APC and MUTYH genes, however, more recently, mutations in the [place] and MSH3 genes have been identified in some polyposis families.    We also discussed Lynch Syndrome, also called HNPCC (Hereditary Non-Polyposis Colon Cancer).  This syndrome increases the risk for colon, uterine, ovarian and stomach cancers, brain cancers, as well as others.  Families with Lynch Syndrome tend to have multiple family members with these cancers, typically diagnosed under age 83, and diagnoses in multiple generations. The genes that are known to cause Lynch Syndrome are called MLH1, MSH2, MSH6, PMS2 and EPCAM.     We reviewed the characteristics, features and inheritance patterns of hereditary cancer syndromes. We also discussed genetic testing, including the appropriate family members to test, the process of testing, insurance coverage and turn-around-time for results. We discussed the implications of a negative, positive and/or variant of uncertain significant result. We recommended Mr. Isadore pursue genetic testing for the [facility] Multi-Cancer Panel + Colorectal Cancer panel.    The Multi-Cancer Panel offered by [facility] includes sequencing and/or deletion duplication testing of the following 84 genes: AIP, ALK, APC, ATM, AXIN2,BAP1,  BARD1, BLM, BMPR1A, BRCA1, BRCA2, BRIP1, CASR, CDC73, CDH1, CDK4, CDKN1B, CDKN1C, CDKN2A (p14ARF), CDKN2A (p16INK4a), CEBPA, CHEK2, CTNNA1, DICER1, DIS3L2, EGFR (c.2369C>T, p.Thr790Met variant only), EPCAM (Deletion/duplication testing only), FH, FLCN, GATA2, GPC3, GREM1 (Promoter region deletion/duplication testing only), HOXB13 (c.251G>A, p.Gly84Glu), HRAS, KIT, MAX, MEN1, MET, MITF (c.952G>A, p.Glu318Lys variant only), MLH1, MSH2, MSH3, MSH6, MUTYH, NBN, NF1, NF2, NTHL1, PALB2, PDGFRA, PHOX2B, PMS2, POLD1, POLE, POT1, PRKAR1A, PTCH1, PTEN, RAD50, RAD51C, RAD51D, RB1, RECQL4, RET, RUNX1, SDHAF2, SDHA (sequence changes only), SDHB, SDHC, SDHD, SMAD4, SMARCA4, SMARCB1, SMARCE1, STK11, SUFU, TERC, TERT, TMEM127, TP53, TSC1, TSC2, VHL, WRN and WT1.   The Colorectal Cancer Panel offered by [facility] includes sequencing and/or deletion duplication testing of the following 30 genes: APC, AXIN2, BMPR1A, CDH1, CHEK2, EPCAM, GREM1, MLH1, MSH2, MSH3, MSH6, MUTYH, NTHL1, PMS2, POLD1, POLE, PTEN, SMAD4, STK11, TP53, ATM, BLM, BUB1B, CEP57, ENG, FLCN, GALNT12, MLH3, RNF43, RPS20.  We discussed that if he is found to have a mutation in one of these genes, it may impact future medical management recommendations such as increased cancer screenings and consideration of risk reducing surgeries.  A positive result could also have implications for the patient's family members.   A Negative result would mean we were unable to identify a hereditary component to his development of adenomatous polyps, but does not rule out the possibility of a hereditary basis for his polyps. There could be mutations that are undetectable by current technology, or in genes not yet tested or identified to increase cancer risk.     We discussed the potential to find a Variant of Uncertain  Significance or VUS.  These are variants that have not yet been identified as pathogenic or benign, and it is unknown if this variant is associated with increased cancer risk or if this is a normal finding.  Most VUS's are reclassified to benign or likely benign.   It should not be used  to make medical management decisions. With time, we suspect the lab will determine the significance of any VUS's identified if any.   Based on Mr. Walberg personal and family history of cancer, he meets NCCN medical criteria for genetic testing. Despite that he meets criteria, he may still have an out of pocket cost. The lab will notify him of an out of pocket cost if any.   We discussed that some people do not want to undergo genetic testing due to fear of genetic discrimination.  A federal law called the Genetic Information Non-Discrimination Act (GINA) of 2008 helps protect individuals against genetic discrimination based on their genetic test results.  It impacts both health insurance and employment.  For health insurance, it protects against increased premiums, being kicked off insurance or being forced to take a test in order to be insured.  For employment it protects against hiring, firing and promoting decisions based on genetic test results.  Health status due to a cancer diagnosis is not protected under GINA.  This law does not protect life insurance, disability insurance, or other types of insurance.   PLAN: After considering the risks, benefits, and limitations, Mr. Livecchi  provided informed consent to pursue genetic testing and the blood sample was sent to [facility] for analysis of the Multi-Cancer Panel + Colorectal Cancer Panel. Results should be available within approximately 2-3 weeks' time, at which point they will be disclosed by telephone to Mr. Kernen, as will any additional recommendations warranted by these results. Mr. Bolin will receive a summary of his genetic counseling visit and a copy  of his results once available. This information will also be available in Epic.   Lastly, we encouraged Mr. Mullens to remain in contact with cancer genetics annually so that we can continuously update the family history and inform him of any changes in cancer genetics and testing that may be of benefit for this family.   Mr.  Bruni questions were answered to his satisfaction today. Our contact information was provided should additional questions or concerns arise. Thank you for the referral and allowing [place] to share in the care of your patient.   Faith Rogue, MS Genetic Counselor [email or web address] Phone: [phone]  The patient was seen for a total of 35 minutes in face-to-face genetic counseling.

## 2018-01-11 ENCOUNTER — Encounter: Payer: Self-pay | Admitting: Licensed Clinical Social Worker

## 2018-01-11 ENCOUNTER — Telehealth: Payer: Self-pay | Admitting: Licensed Clinical Social Worker

## 2018-01-11 ENCOUNTER — Ambulatory Visit: Payer: Self-pay | Admitting: Licensed Clinical Social Worker

## 2018-01-11 DIAGNOSIS — Z1379 Encounter for other screening for genetic and chromosomal anomalies: Secondary | ICD-10-CM

## 2018-01-11 NOTE — Progress Notes (Addendum)
HPI:  Mr. Moyano was previously seen in the [place] clinic on 01/02/2018 due to a personal history of adenomatous colon polyps, family history of cancer and concerns regarding a hereditary predisposition to cancer. Please refer to our prior cancer genetics clinic note for more information regarding Mr. Rostro medical, social and family histories, and our assessment and recommendations, at the time. Mr. Wade recent genetic test results were disclosed to him, as well as recommendations warranted by these results. These results and recommendations are discussed in more detail below.  CANCER HISTORY:   No history exists.     FAMILY HISTORY:  We obtained a detailed, 4-generation family history.  Significant diagnoses are listed below: Family History  Problem Relation Age of Onset  . Ovarian cancer Mother   . Ulcers Mother        stomach  . Coronary artery disease Father 45       CABG X4  . Hypertension Father   . Stroke Father 68  . Heart attack Father 70  . Heart attack Sister 31  . Heart Problems Paternal Aunt   . Heart attack Paternal Uncle   . Heart attack Paternal Aunt   . Heart attack Paternal Uncle   . Diabetes Neg Hx   . Prostate cancer Neg Hx   . Breast cancer Neg Hx   . Colon cancer Neg Hx   . Depression Neg Hx   . Alcohol abuse Neg Hx   . Colon polyps Neg Hx     Mr. Bovey has one son, [name], who is 59 with no cancer history. He has two granddaughters. Mr. Buer has a sister, [name], who is 5. He also had a half-brother through his mother, but he does not have information about him and he is deceased. She had a heart attack a few years ago, no history of cancer. Mr. Correa believes she may have had genetic testing at some point and will let me know if he finds out.  Mr. Odonoghue mother [name] was diagnosed with ovarian cancer at 22 and died at 36. She had two sisters who have passed away. Mr. Mctavish has limited information about his maternal aunts,  cousins and grandparents but reports no history of cancer/polyps to his knowledge in these individuals. Mr. Krach maternal grandparents passed away in their 56's-80's.   Mr. Caudillo father [name] died of a stroke at 66. He had two brothers who have passed away of heart attacks in their 90's. He also had 5 sisters. Mr. Dorko has limited information about his paternal aunts/uncles/cousins/grandparents, but does not believe there is any history of cancers/polyps. He has no information about his paternal grandfather, his paternal grandmother died in her 91's.   Mr. Kimberlin is  of previous family history of genetic testing for hereditary cancer risks. Patient's maternal ancestors are of Caucasian descent, and paternal ancestors are of Caucasian descent. There is no reported Ashkenazi Jewish ancestry. There is no known consanguinity.  GENETIC TEST RESULTS: Genetic testing performed through [facility]'s Common Hereditary Cancers Panel + Colorectal Cancer Panel reported out on 01/09/2018 showed no pathogenic mutations. The Multi-Cancer Panel offered by [facility] includes sequencing and/or deletion duplication testing of the following 84 genes: AIP, ALK, APC, ATM, AXIN2,BAP1,  BARD1, BLM, BMPR1A, BRCA1, BRCA2, BRIP1, CASR, CDC73, CDH1, CDK4, CDKN1B, CDKN1C, CDKN2A (p14ARF), CDKN2A (p16INK4a), CEBPA, CHEK2, CTNNA1, DICER1, DIS3L2, EGFR (c.2369C>T, p.Thr790Met variant only), EPCAM (Deletion/duplication testing only), FH, FLCN, GATA2, GPC3, GREM1 (Promoter region deletion/duplication testing only), HOXB13 (c.251G>A, p.Gly84Glu), HRAS,  KIT, MAX, MEN1, MET, MITF (c.952G>A, p.Glu318Lys variant only), MLH1, MSH2, MSH3, MSH6, MUTYH, NBN, NF1, NF2, NTHL1, PALB2, PDGFRA, PHOX2B, PMS2, POLD1, POLE, POT1, PRKAR1A, PTCH1, PTEN, RAD50, RAD51C, RAD51D, RB1, RECQL4, RET, RUNX1, SDHAF2, SDHA (sequence changes only), SDHB, SDHC, SDHD, SMAD4, SMARCA4, SMARCB1, SMARCE1, STK11, SUFU, TERC, TERT, TMEM127, TP53, TSC1, TSC2, VHL, WRN and  WT1. The Colorectal Cancer Panel offered by [facility] includes sequencing and/or deletion duplication testing of the following 30 genes: APC, AXIN2, BMPR1A, CDH1, CHEK2, EPCAM, GREM1, MLH1, MSH2, MSH3, MSH6, MUTYH, NTHL1, PMS2, POLD1, POLE, PTEN, SMAD4, STK11, TP53, ATM, BLM, BUB1B, CEP57, ENG, FLCN, GALNT12, MLH3, RNF43, RPS20.  The test report will be scanned into EPIC and will be located under the Molecular Pathology section of the Results Review tab. A portion of the result report is included below for reference.     We discussed with Mr. Mickley that because current genetic testing is not perfect, it is possible there may be a mutation in one of these genes that current testing cannot detect, but that chance is small.  We also discussed, that there could be another gene that has not yet been discovered, or that we have not yet tested, that is responsible for the cancer diagnoses in the family. It is also possible there is a hereditary cause for the cancer in the family that Mr. Alejandro did not inherit and therefore was not identified in his testing.  Therefore, it is important to remain in touch with cancer genetics in the future so that we can continue to offer Mr. Nuon the most up to date genetic testing.   ADDITIONAL GENETIC TESTING: We discussed with Mr. Kats that his genetic testing was fairly extensive.  If there are are genes identified to increase cancer risk that can be analyzed in the future, we would be happy to discuss and coordinate this testing at that time.    CANCER SCREENING RECOMMENDATIONS: Mr. Scorsone test result is considered negative (normal).  This means that we have not identified a hereditary cause for his personal history of colon polyps and family history of cancer at this time.   This normal indicates that it is unlikely Mr. Gelles has an increased risk of cancer due to a mutation in one of these genes. While reassuring, this does not definitively rule out a hereditary  predisposition to cancer. It is still possible that there could be genetic mutations that are undetectable by current technology, or genetic mutations in genes that have not been tested or identified to increase cancer risk.    Given his history of colon and duodenal polyps, it should be assumed he is at high risk to develop more GI polyps and should be screened approprately. Therefore, it is recommended he continue to follow the cancer management and screening guidelines provided by his GI and primary healthcare provider. An individual's cancer risk is not determined by genetic test results alone.  Overall cancer risk assessment includes additional factors such as personal medical history, family history, etc.  These should be used to make a personalized plan for cancer prevention and surveillance.    RECOMMENDATIONS FOR FAMILY MEMBERS:  Relatives in this family might be at some increased risk of developing cancer, over the general population risk, simply due to the family history of cancer.  We recommended women in this family have a yearly mammogram beginning at age 91, or 57 years younger than the earliest onset of cancer, an annual clinical breast exam, and perform monthly breast self-exams. Women  in this family should also have a gynecological exam as recommended by their primary provider. All family members should have a colonoscopy by age 99 (or as directed by their doctors).  We discussed that he should tell his son about his polyp history so that his son can let his doctor know. All family members should inform their physicians about the family history of cancer so their doctors can make the most appropriate screening recommendations for them.   For first degree relatives of a person with colonic adenomatous polyposis if unknown etiology >20-<100 adenomas:  Colonoscopy should be considered every 3-5 years starting at age 2 (or age when polyposis dx in relative) See NCCN Genetic/Familial High-Risk  Assessment: Colorectal guidelines for further recommendations/commentary.    It is also possible there is a hereditary cause for the cancer in Mr. Trevino family that he did not inherit and therefore was not identified in him.  We recommended his maternal relatives have genetic counseling and testing. Mr. Vicencio will let us know if we can be of any assistance in coordinating genetic counseling and/or testing for these family members.   FOLLOW-UP: Lastly, we discussed with Mr. Poythress that cancer genetics is a rapidly advancing field and it is possible that new genetic tests will be appropriate for him and/or his family members in the future. We encouraged him to remain in contact with cancer genetics on an annual basis so we can update his personal and family histories and let him know of advances in cancer genetics that may benefit this family.   Our contact number was provided. Mr. Cookston questions were answered to his satisfaction, and he knows he is welcome to call us at anytime with additional questions or concerns.  Faith Rogue, MS Genetic Counselor [email or web address] Phone: [phone]

## 2018-01-11 NOTE — Telephone Encounter (Signed)
Revealed negative genetic testing. It is unlikely his polyps are due to a hereditary cause.  It is unlikely that there is an increased risk of cancer due to a mutation in one of these genes.  However, genetic testing is not perfect, and cannot definitively rule out a hereditary cause.  It will be important for him to keep in contact with genetics to learn if any additional testing may be needed in the future.  We discussed sharing his history with family, especially his son, and continuing to follow doctors' recommendations for cancer screening. His maternal side of the family could benefit from genetic counseling and testing.

## 2018-03-08 MED ORDER — ATORVASTATIN CALCIUM 40 MG PO TABS: 20.0000 mg | ORAL_TABLET | Freq: Every day | ORAL | 3 refills | Status: DC

## 2018-03-08 NOTE — Addendum Note (Signed)
Addended by: Vanessa Ralphs on: 03/08/2018 10:54 AM   Modules accepted: Orders

## 2018-03-16 ENCOUNTER — Other Ambulatory Visit
Admission: RE | Admit: 2018-03-16 | Discharge: 2018-03-16 | Disposition: A | Discharge status: Home / Self Care | Payer: Medicare Other | Attending: Internal Medicine | Admitting: Internal Medicine

## 2018-03-16 DIAGNOSIS — Z13220 Encounter for screening for lipoid disorders: Secondary | ICD-10-CM | POA: Insufficient documentation

## 2018-03-16 DIAGNOSIS — I10 Essential (primary) hypertension: Secondary | ICD-10-CM | POA: Diagnosis not present

## 2018-03-16 DIAGNOSIS — Z79899 Other long term (current) drug therapy: Secondary | ICD-10-CM | POA: Diagnosis not present

## 2018-03-16 LAB — ALT: ALT: 24 U/L (ref 0–44)

## 2018-03-16 LAB — LIPID PANEL
Cholesterol: 103 mg/dL (ref 0–200)
HDL: 59 mg/dL (ref >40)
LDL Cholesterol: 39 mg/dL (ref 0–99)
Total CHOL/HDL Ratio: 1.7 RATIO
Triglycerides: 25 mg/dL (ref <150)
VLDL: 5 mg/dL (ref 0–40)

## 2018-03-20 ENCOUNTER — Encounter: Payer: Self-pay | Admitting: Family Medicine

## 2018-03-23 ENCOUNTER — Telehealth: Payer: Self-pay

## 2018-03-23 MED ORDER — ATORVASTATIN CALCIUM 40 MG PO TABS: 20.0000 mg | ORAL_TABLET | Freq: Every day | ORAL | 4 refills | Status: DC

## 2018-03-23 MED ORDER — ATORVASTATIN CALCIUM 40 MG PO TABS: 40.0000 mg | ORAL_TABLET | Freq: Every day | ORAL | 3 refills | Status: DC

## 2018-03-23 NOTE — Telephone Encounter (Signed)
Call to patient regarding Atorvastatin refill. Per pt he has been taking 1 tablet (40 mg) once daily as indicated by phone note on 10/23. Epic currently had him still taking 1/2 tab and he states that he will not have enough refills to last him to f/u in July.   Rx change completed and call to patient to make him aware.  He had schedule lab work as planned and has already reviewed lab work with Anderson Malta, Therapist, sports.   No further orders at this time. Will schedule appt closer to July.  Advised pt to call for any further questions or concerns.

## 2018-03-23 NOTE — Telephone Encounter (Signed)
Duplicate

## 2018-03-26 ENCOUNTER — Encounter: Payer: Self-pay | Admitting: Family Medicine

## 2018-03-26 NOTE — Progress Notes (Signed)
BP 124/70 (BP Location: Left Arm, Patient Position: Sitting, Cuff Size: Large)   Pulse 85   Temp 97.6 F (36.4 C) (Oral)   Ht 6' 1.5" (1.867 m)   Wt (!) 317 lb 12 oz (144.1 kg)   SpO2 96%   BMI 41.35 kg/m    CC: 6 mo f/u visit Subjective:    Patient ID: Dennis Ayers, male    DOB: 1950/07/01, 68 y.o.   MRN: [id number]  HPI: Dennis Ayers is a 68 y.o. male presenting on 03/27/2018 for Diabetes (Here for 6 mo f/u.)   Weight gain noted attributes to return to work with driving long distances weekly, poorer diet (eating out).   DM - does regularly check sugars with freestyle CGM - averaging 103-117. Compliant with antihyperglycemic regimen which includes: metformin 500mg  daily. Denies low sugars or hypoglycemic symptoms. Denies paresthesias. Last diabetic eye exam 06/2017. Pneumovax: 06/2015. Prevnar: 10/2015. Glucometer brand: freestyle CGM. DSME: at [facility].  Lab Results  Component Value Date   HGBA1C 5.3 03/27/2018   Diabetic Foot Exam - Simple   Simple Foot Form Diabetic Foot exam was performed with the following findings:  Yes 03/27/2018  8:15 AM  Visual Inspection No deformities, no ulcerations, no other skin breakdown bilaterally:  Yes Sensation Testing Intact to touch and monofilament testing bilaterally:  Yes Pulse Check See comments:  Yes Comments 1+ DP bilaterally    Lab Results  Component Value Date   MICROALBUR 0.8 03/04/2017        Relevant past medical, surgical, family and social history reviewed and updated as indicated. Interim medical history since our last visit reviewed. Allergies and medications reviewed and updated. Outpatient Medications Prior to Visit  Medication Sig Dispense Refill  . aspirin EC 81 MG tablet Take 81 mg by mouth daily.    Marland Kitchen atorvastatin (LIPITOR) 40 MG tablet Take 1 tablet (40 mg total) by mouth daily at 6 PM. 90 tablet 3  . Continuous Blood Gluc Sensor (FREESTYLE LIBRE 14 DAY SENSOR) MISC 1 Units by Does not apply route every  14 (fourteen) days. 6 each 3  . DiphenhydrAMINE HCl, Sleep, (UNISOM SLEEPMELTS) 25 MG TBDP Take 25 mg by mouth at bedtime.     Marland Kitchen glucose blood (ONE TOUCH ULTRA TEST) test strip USE AS INSTRUCTED TO CHECK BLOOD SUGAR ONCE A DAY AS NEEDED. DX CODE: E11.9 25 each 3  . ibuprofen (ADVIL,MOTRIN) 200 MG tablet Take 600 mg by mouth daily as needed for moderate pain.    Tiant Peixoto Docker Oil 300 MG CAPS Take 300 mg by mouth daily.    . metFORMIN (GLUCOPHAGE) 500 MG tablet Take 1 tablet (500 mg total) by mouth daily with breakfast. 90 tablet 3  . Misc Natural Products (OSTEO BI-FLEX JOINT SHIELD PO) Take 1 capsule by mouth daily.     . Multiple Vitamin (MULTIVITAMIN) tablet Take 1 tablet by mouth daily.    Glory Rosebush DELICA LANCETS 40X MISC Use as directed to check blood sugar once daily as needed.  Dx code: E11.9 100 each 1  . losartan (COZAAR) 100 MG tablet Take 0.5 tablets (50 mg total) by mouth daily. 45 tablet 2   No facility-administered medications prior to visit.      Per HPI unless specifically indicated in ROS section below Review of Systems Objective:    BP 124/70 (BP Location: Left Arm, Patient Position: Sitting, Cuff Size: Large)   Pulse 85   Temp 97.6 F (36.4 C) (Oral)   Ht 6'  1.5" (1.867 m)   Wt (!) 317 lb 12 oz (144.1 kg)   SpO2 96%   BMI 41.35 kg/m   Wt Readings from Last 3 Encounters:  03/27/18 (!) 317 lb 12 oz (144.1 kg)  11/16/17 (!) 309 lb (140.2 kg)  11/02/17 (!) 309 lb 3.2 oz (140.3 kg)    Physical Exam Vitals signs and nursing note reviewed.  Constitutional:      General: He is not in acute distress.    Appearance: He is well-developed.  HENT:     Head: Normocephalic and atraumatic.     Right Ear: External ear normal.     Left Ear: External ear normal.     Nose: Nose normal.     Mouth/Throat:     Pharynx: No oropharyngeal exudate.  Eyes:     General: No scleral icterus.    Conjunctiva/sclera: Conjunctivae normal.     Pupils: Pupils are equal, round, and reactive to  light.  Neck:     Musculoskeletal: Normal range of motion and neck supple.  Cardiovascular:     Rate and Rhythm: Normal rate and regular rhythm.     Heart sounds: Normal heart sounds. No murmur.  Pulmonary:     Effort: Pulmonary effort is normal. No respiratory distress.     Breath sounds: Normal breath sounds. No wheezing or rales.  Musculoskeletal:     Comments: See HPI for foot exam if done  Lymphadenopathy:     Cervical: No cervical adenopathy.  Skin:    General: Skin is warm and dry.     Findings: No rash.       Results for orders placed or performed in visit on 03/27/18  POCT glycosylated hemoglobin (Hb A1C)  Result Value Ref Range   Hemoglobin A1C 5.3 4.0 - 5.6 %   HbA1c POC (<> result, manual entry)     HbA1c, POC (prediabetic range)     HbA1c, POC (controlled diabetic range)     Lab Results  Component Value Date   CREATININE 0.96 09/09/2017   BUN 25 (H) 09/09/2017   NA 140 09/09/2017   K 4.3 09/09/2017   CL 105 09/09/2017   CO2 29 09/09/2017    Assessment & Plan:   Problem List Items Addressed This Visit    Severe obesity (BMI 35.0-39.9) with comorbidity ([place])    Weight gain noted - attributes to recent work lifestyle - but has retired and planning on increasing regular exercise in routine.       Controlled diabetes mellitus type 2 with complications (HCC) - Primary    Chronic, stable. Remains under excellent control. Starting A1c 8.4% (2018). Continue low dose metformin.       Relevant Medications   losartan (COZAAR) 50 MG tablet   Other Relevant Orders   POCT glycosylated hemoglobin (Hb A1C) (Completed)   Microalbumin / creatinine urine ratio       Meds ordered this encounter  Medications  . losartan (COZAAR) 50 MG tablet    Sig: Take 1 tablet (50 mg total) by mouth daily.    Dispense:  90 tablet    Refill:  1    If available   Orders Placed This Encounter  Procedures  . Microalbumin / creatinine urine ratio  . POCT glycosylated hemoglobin  (Hb A1C)    Follow up plan: Return in about 6 months (around 09/25/2018) for annual exam, prior fasting for blood work, medicare wellness visit.  Ria Bush, MD

## 2018-03-27 ENCOUNTER — Other Ambulatory Visit: Payer: Self-pay | Admitting: Family Medicine

## 2018-03-27 ENCOUNTER — Encounter: Payer: Self-pay | Admitting: Family Medicine

## 2018-03-27 ENCOUNTER — Ambulatory Visit (INDEPENDENT_AMBULATORY_CARE_PROVIDER_SITE_OTHER): Payer: Medicare Other | Admitting: Family Medicine

## 2018-03-27 VITALS — BP 124/70 | HR 85 | Temp 97.6°F | Ht 73.5 in | Wt 317.8 lb

## 2018-03-27 DIAGNOSIS — E118 Type 2 diabetes mellitus with unspecified complications: Secondary | ICD-10-CM | POA: Diagnosis not present

## 2018-03-27 LAB — POCT GLYCOSYLATED HEMOGLOBIN (HGB A1C): HEMOGLOBIN A1C: 5.3 % (ref 4.0–5.6)

## 2018-03-27 LAB — MICROALBUMIN / CREATININE URINE RATIO
Creatinine,U: 128.9 mg/dL
MICROALB UR: 1 mg/dL (ref 0.0–1.9)
Microalb Creat Ratio: 0.7 mg/g (ref 0.0–30.0)

## 2018-03-27 MED ORDER — LOSARTAN POTASSIUM 50 MG PO TABS: 50.0000 mg | ORAL_TABLET | Freq: Every day | ORAL | 1 refills | Status: DC

## 2018-03-27 NOTE — Assessment & Plan Note (Signed)
Weight gain noted - attributes to recent work lifestyle - but has retired and planning on increasing regular exercise in routine.

## 2018-03-27 NOTE — Assessment & Plan Note (Signed)
Chronic, stable. Remains under excellent control. Starting A1c 8.4% (2018). Continue low dose metformin.

## 2018-03-27 NOTE — Patient Instructions (Addendum)
We will see if losartan 50mg  tablets are available.  Work on regular activity this coming spring.  Return as needed or in 6 months for physical.

## 2018-05-10 ENCOUNTER — Other Ambulatory Visit: Payer: Self-pay | Admitting: Family Medicine

## 2018-05-10 NOTE — Telephone Encounter (Signed)
Patient takes Metformin 1 tablet once daily as of August 2019. This request for old RX for twice daily-declining. RX for 1 tablet once daily was sent in in August 2019 for 90 tablets with 3 refills.

## 2018-05-12 ENCOUNTER — Other Ambulatory Visit: Payer: Self-pay | Admitting: Family Medicine

## 2018-05-12 NOTE — Telephone Encounter (Signed)
E-scribed refill 

## 2018-07-04 DIAGNOSIS — E119 Type 2 diabetes mellitus without complications: Secondary | ICD-10-CM | POA: Diagnosis not present

## 2018-07-04 LAB — HM DIABETES EYE EXAM

## 2018-07-05 ENCOUNTER — Encounter: Payer: Self-pay | Admitting: Family Medicine

## 2018-07-31 IMAGING — CT CT HEART SCORING
2 series · 16 of 20 positions shown, 18 images · non-contrast
Comparison: None.

CLINICAL DATA: Risk stratification

EXAM:
Coronary Calcium Score
TECHNIQUE: The patient was scanned on a Siemens Somatom 64 slice scanner. Axial
non-contrast 3 mm slices were carried out through the heart. The
data set was analyzed on a dedicated work station and scored using
the Agatson method.

[Series 2: casc 3.0 i36f 2 bestdiast 60 % · axial · 0.48mm/px · z∈[-234,-147]mm · 8 of 39 slices shown, 10 images]
[im 5/39  vessel]
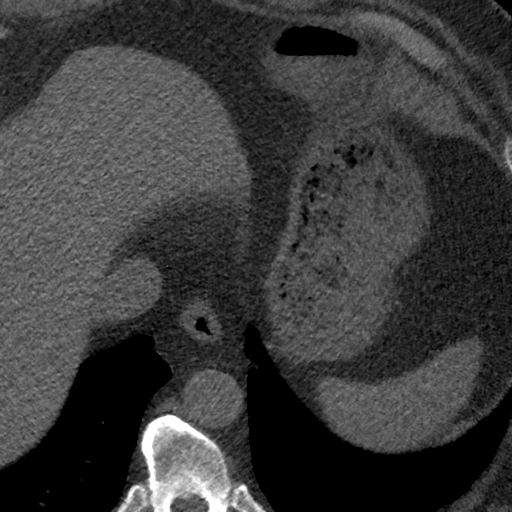
[im 5/39  lung]
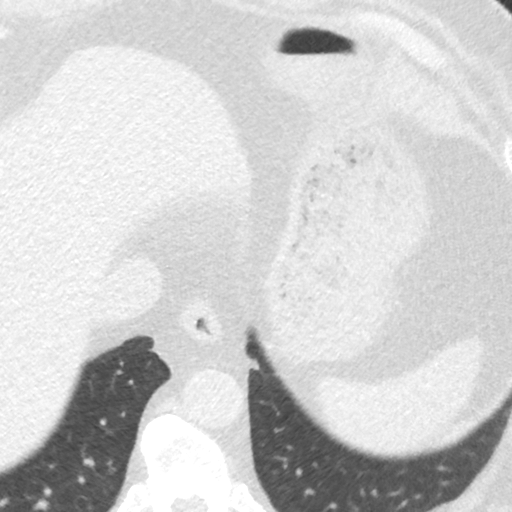
[im 9/39  vessel]
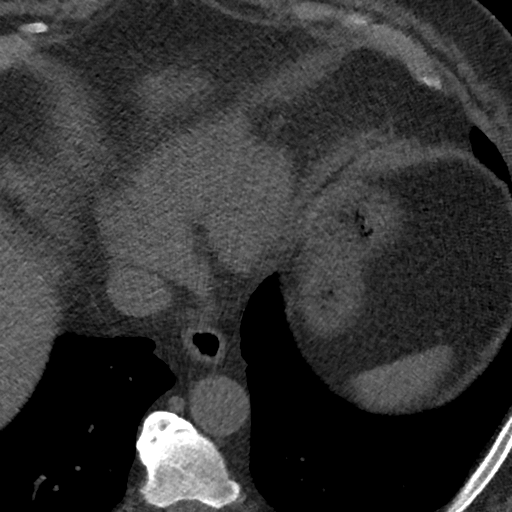
[im 13/39  vessel]
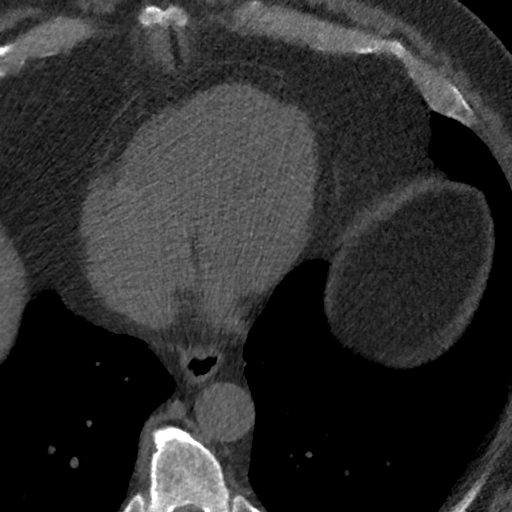
[im 17/39  vessel]
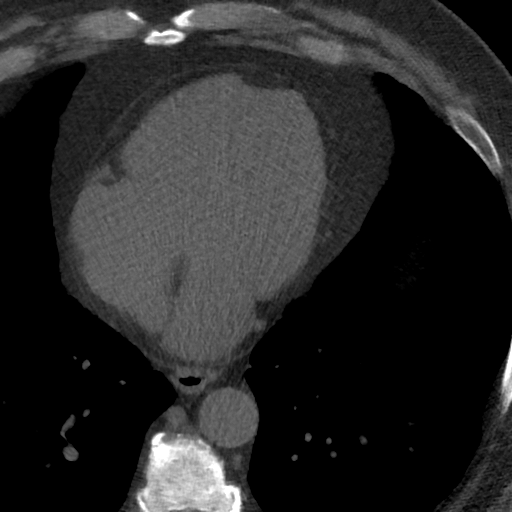
[im 22/39  vessel]
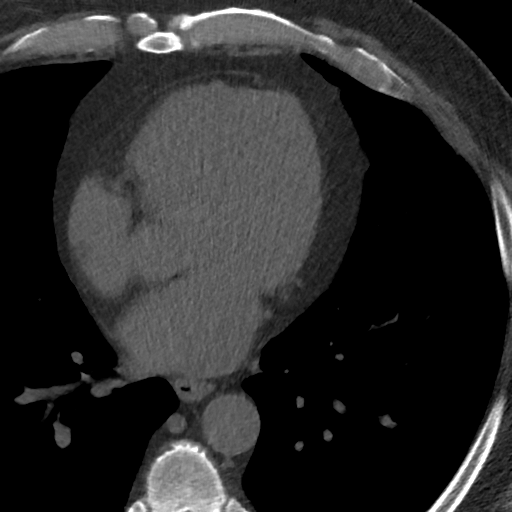
[im 22/39  lung]
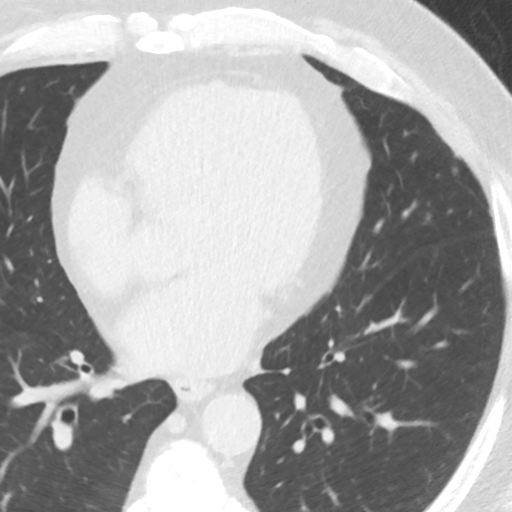
[im 26/39  vessel]
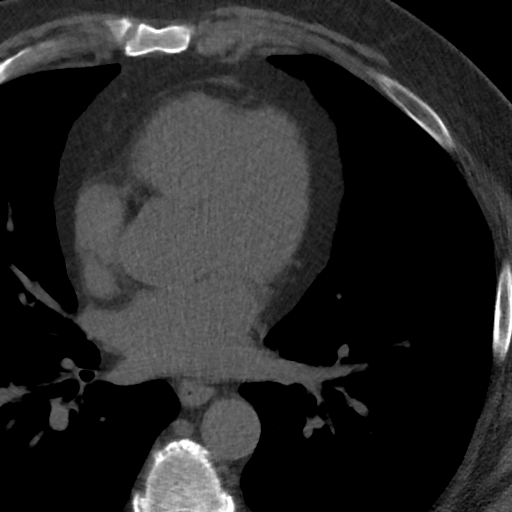
[im 30/39  vessel]
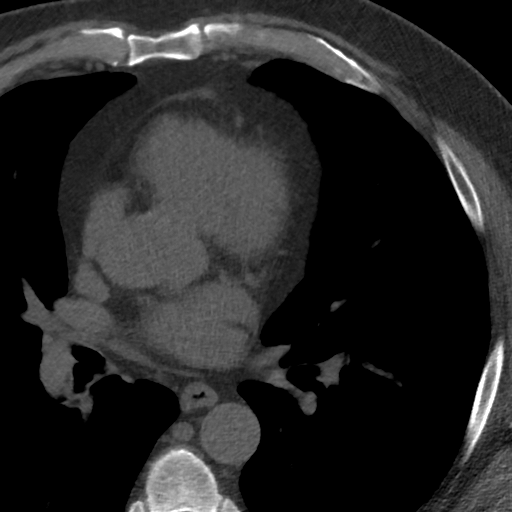
[im 34/39  vessel]
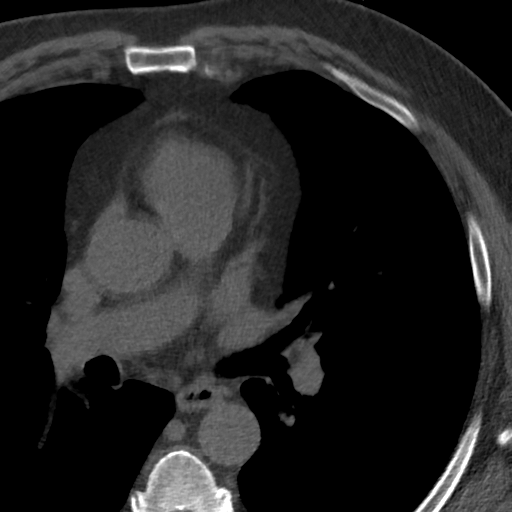

[Series 4: lung st 60 % · axial · 0.83mm/px · z∈[-234,-147]mm · 8 of 39 slices shown]
[im 5/39  lung]
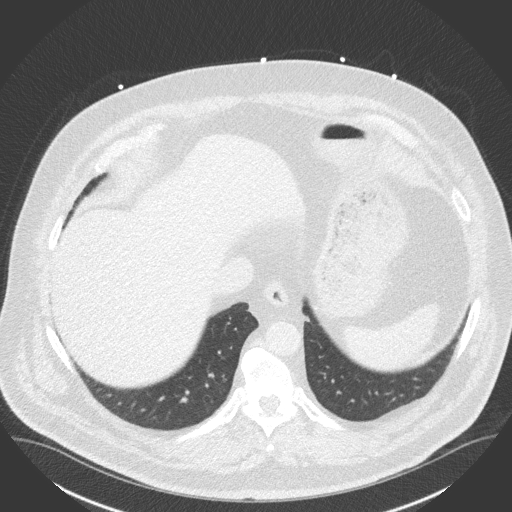
[im 9/39  lung]
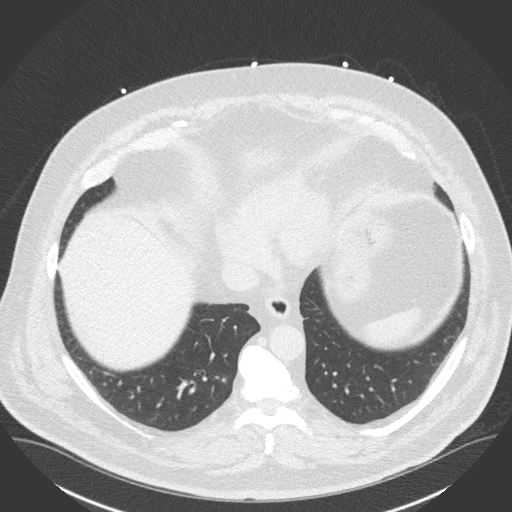
[im 13/39  lung]
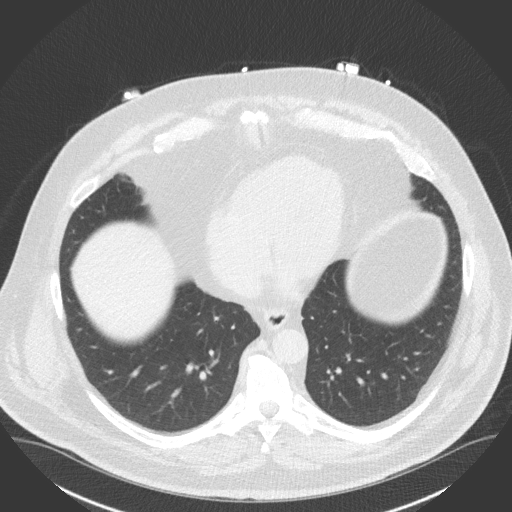
[im 17/39  lung]
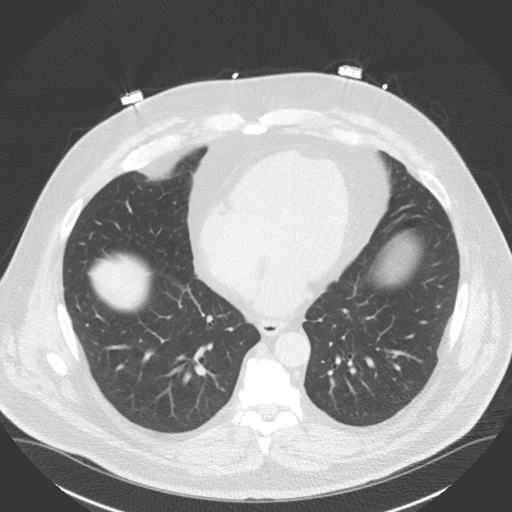
[im 22/39  lung]
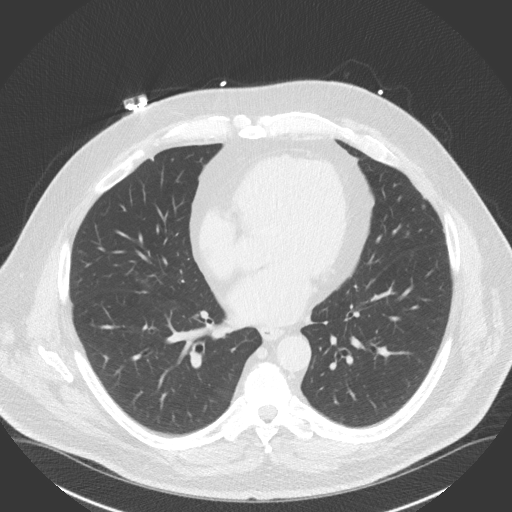
[im 26/39  lung]
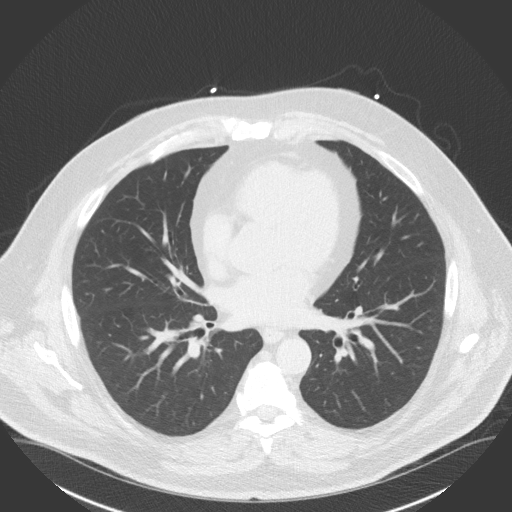
[im 30/39  lung]
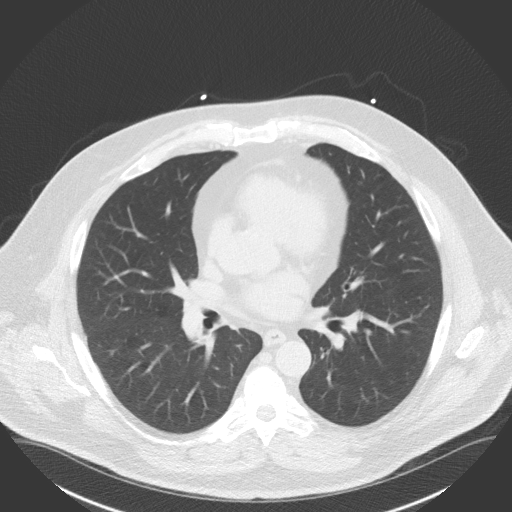
[im 34/39  lung]
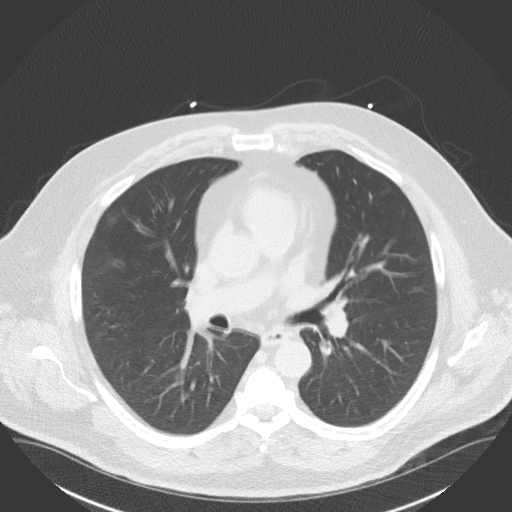

[16 of 20 positions shown; findings below may reference images not displayed]

FINDINGS: Non-cardiac: See separate report from [REDACTED].

Ascending Aorta:  3.6 cm

Pericardium: Normal

Coronary arteries: Calcium seen in proximal portion of all 3
coronary arteries
IMPRESSION: Coronary calcium score of 102 . This was 53 [REDACTED] percentile for age
and sex matched control.

[place] Baso

EXAM:
OVER-READ INTERPRETATION  CT CHEST

The following report is an over-read performed by radiologist Dr.
Yousif Pessoa [REDACTED] on 06/04/2016. This over-read
does not include interpretation of cardiac or coronary anatomy or
pathology. The coronary calcium score interpretation by the
cardiologist is attached.
FINDINGS: Cardiovascular: Heart is normal size. Visualized aorta is normal
caliber.

Mediastinum/Nodes: No adenopathy in the visualized lower mediastinum
or hila. Distal esophagus is unremarkable.

Lungs/Pleura: Visualized lungs are clear.  No effusions.

Upper Abdomen: Imaging into the upper abdomen shows no acute
findings.

Musculoskeletal: Visualized chest wall soft tissues are
unremarkable. No acute bony abnormality.
IMPRESSION: No acute or significant extracardiac abnormality.

## 2018-09-12 ENCOUNTER — Other Ambulatory Visit: Payer: Self-pay | Admitting: Family Medicine

## 2018-09-15 ENCOUNTER — Other Ambulatory Visit: Payer: Self-pay

## 2018-09-15 ENCOUNTER — Ambulatory Visit: Payer: Medicare Other | Admitting: Internal Medicine

## 2018-09-15 ENCOUNTER — Encounter: Payer: Self-pay | Admitting: Internal Medicine

## 2018-09-15 VITALS — BP 128/78 | HR 82 | Ht 74.0 in | Wt 323.5 lb

## 2018-09-15 DIAGNOSIS — I2584 Coronary atherosclerosis due to calcified coronary lesion: Secondary | ICD-10-CM | POA: Diagnosis not present

## 2018-09-15 DIAGNOSIS — E785 Hyperlipidemia, unspecified: Secondary | ICD-10-CM

## 2018-09-15 DIAGNOSIS — I10 Essential (primary) hypertension: Secondary | ICD-10-CM

## 2018-09-15 DIAGNOSIS — I2510 Atherosclerotic heart disease of native coronary artery without angina pectoris: Secondary | ICD-10-CM | POA: Diagnosis not present

## 2018-09-15 MED ORDER — ATORVASTATIN CALCIUM 40 MG PO TABS: 40.0000 mg | ORAL_TABLET | Freq: Every day | ORAL | 3 refills | Status: DC

## 2018-09-15 NOTE — Progress Notes (Signed)
Follow-up Outpatient Visit Date: 09/15/2018  Primary Care Provider: Ria Bush, MD [place] [address]  Chief Complaint: Follow-up hypertension and coronary artery calcification  HPI:  Dennis Ayers is a 68 y.o. year-old male with history of coronary artery calcification, type 2 diabetes mellitus, hypertension, morbid obesity, obstructive sleep apnea, and degenerative joint disease, who presents for follow-up of coronary artery calcification.  I last saw him a year ago, at which time he was doing well.  He noted mild exertional dyspnea, which was stable to slightly better than at prior visits.  He had increased his activity and was losing weight since beginning retirement.  To begin atorvastatin 20 mg daily for target LDL less than 70 in the setting of coronary artery calcification and diabetes.  Today, Dennis Ayers reports that he has been doing well other than weight gain that he attributes to dietary indiscrtion and decreased activity in the setting of COVID-19 pandemic.  He denies chest pain, shortness of breath, palpitations, lightheadedness, orthopnea, and edema.  He is tolerating his medications well.  He is due to for labs next month with Dr. [name].  --------------------------------------------------------------------------------------------------  Cardiovascular History & Procedures: Cardiovascular Problems:  Coronary artery calcification  Dilated thoracic aorta by echo; upper normal on cardiac CT  Risk Factors:  Hypertension, diabetes mellitus, obesity, sedentary lifestyle, male gender, age greater than 63, and family history  Cath/PCI:  None  CV Surgery:  None  EP Procedures and Devices:  None  Non-Invasive Evaluation(s):  Exercise tolerance test (08/23/2016): Poor exercise capacity.  No ischemia at workload achieved (4.8 METS).  Intermediate risk study ([facility] treadmill score 3) due to poor exercise capacity.  TTE (06/29/16):  Normal LV size with mild LVH. LVEF 55-60% with normal wall motion. Normal diastolic function. Mildly dilated aortic root, measuring 4.0 cm. Mild left atrial enlargement. Normal RV size and function. No significant valvular abnormalities.  Coronary calcium score (06/09/16): Calcification of all 3 coronary arteries noted within a score of 102Agatstonunits (53rd percentile for age and sex matched cohort). Ascending aorta measuring 3.6 cm in diameter. No significant extracardiac abnormalities in the visualized thorax.  Recent CV Pertinent Labs: Lab Results  Component Value Date   CHOL 103 03/16/2018   HDL 59 03/16/2018   LDLCALC 39 03/16/2018   TRIG 25 03/16/2018   CHOLHDL 1.7 03/16/2018   INR 1.04 07/19/2012   K 4.3 09/09/2017   BUN 25 (H) 09/09/2017   BUN 24 05/19/2016   CREATININE 0.96 09/09/2017    Past medical and surgical history were reviewed and updated in EPIC.  Current Meds  Medication Sig  . aspirin EC 81 MG tablet Take 81 mg by mouth daily.  Marland Kitchen atorvastatin (LIPITOR) 40 MG tablet Take 1 tablet (40 mg total) by mouth daily at 6 PM.  . Continuous Blood Gluc Sensor (FREESTYLE LIBRE 14 DAY SENSOR) MISC 1 Units by Does not apply route every 14 (fourteen) days.  . DiphenhydrAMINE HCl, Sleep, (UNISOM SLEEPMELTS) 25 MG TBDP Take 25 mg by mouth at bedtime.   Marland Kitchen glucose blood (ONE TOUCH ULTRA TEST) test strip USE AS INSTRUCTED TO CHECK BLOOD SUGAR ONCE A DAY AS NEEDED. DX CODE: E11.9  . ibuprofen (ADVIL,MOTRIN) 200 MG tablet Take 600 mg by mouth daily as needed for moderate pain.  Javier Docker Oil 300 MG CAPS Take 300 mg by mouth daily.  Marland Kitchen losartan (COZAAR) 100 MG tablet TAKE ONE-HALF TABLET DAILY  . metFORMIN (GLUCOPHAGE) 500 MG tablet Take 1 tablet (500  mg total) by mouth daily with breakfast.  . Misc Natural Products (OSTEO BI-FLEX JOINT SHIELD PO) Take 1 capsule by mouth daily.   . Multiple Vitamin (MULTIVITAMIN) tablet Take 1 tablet by mouth daily.  Glory Rosebush DELICA LANCETS 58N MISC  Use as directed to check blood sugar once daily as needed.  Dx code: E11.9  . [DISCONTINUED] losartan (COZAAR) 100 MG tablet Take 0.5 tablets (50 mg total) by mouth daily.    Allergies: Lisinopril  Social History   Tobacco Use  . Smoking status: Never Smoker  . Smokeless tobacco: Never Used  Substance Use Topics  . Alcohol use: No  . Drug use: No    Family History  Problem Relation Age of Onset  . Ovarian cancer Mother   . Ulcers Mother        stomach  . Coronary artery disease Father 30       CABG X4  . Hypertension Father   . Stroke Father 66  . Heart attack Father 47  . Heart attack Sister 30  . Heart Problems Paternal Aunt   . Heart attack Paternal Uncle   . Heart attack Paternal Aunt   . Heart attack Paternal Uncle   . Diabetes Neg Hx   . Prostate cancer Neg Hx   . Breast cancer Neg Hx   . Colon cancer Neg Hx   . Depression Neg Hx   . Alcohol abuse Neg Hx   . Colon polyps Neg Hx     Review of Systems: A 12-system review of systems was performed and was negative except as noted in the HPI.  --------------------------------------------------------------------------------------------------  Physical Exam: BP 128/78 (BP Location: Left Arm, Patient Position: Sitting, Cuff Size: Large)   Pulse 82   Ht 6\' 2"  (1.88 m)   Wt (!) 323 lb 8 oz (146.7 kg)   BMI 41.53 kg/m   General:  NAD HEENT: No conjunctival pallor or scleral icterus. Facemask in place Neck: Supple without lymphadenopathy, thyromegaly, JVD, or HJR, though evaluation is limited by body habitus. Lungs: Normal work of breathing. Clear to auscultation bilaterally without wheezes or crackles. Heart: Regular rate and rhythm without murmurs, rubs, or gallops. Unable to assess PMI due to body habitus. Abd: Bowel sounds present. Soft, NT/ND.  Unable to assess HSM due to body habitus. Ext: No lower extremity edema. Radial, PT, and DP pulses are 2+ bilaterally. Skin: Warm and dry without rash.  EKG:  NSR  with left axis deviation and low voltage.  Lab Results  Component Value Date   WBC 4.9 09/09/2017   HGB 16.6 09/09/2017   HCT 47.9 09/09/2017   MCV 94.0 09/09/2017   PLT 226.0 09/09/2017    Lab Results  Component Value Date   NA 140 09/09/2017   K 4.3 09/09/2017   CL 105 09/09/2017   CO2 29 09/09/2017   BUN 25 (H) 09/09/2017   CREATININE 0.96 09/09/2017   GLUCOSE 109 (H) 09/09/2017   ALT 24 03/16/2018    Lab Results  Component Value Date   CHOL 103 03/16/2018   HDL 59 03/16/2018   LDLCALC 39 03/16/2018   TRIG 25 03/16/2018   CHOLHDL 1.7 03/16/2018    --------------------------------------------------------------------------------------------------  ASSESSMENT AND PLAN: Coronary artery calcification: No symptoms to suggest worsening coronary insufficieny.  Continue current medications and lifestyle modifications for risk factor reduction.  Hypertension: BP well-controlled.  No medication changes today.  Hyperlipidemia: LDL well-controlled.  Mr. Pienta is due for labs with Dr. [name] next month; if LDL  remains < 50, we could consider decreasing dose of atorvastatin (goal LDL < 70).  Morbid obesity: Mr. Hopping has gained weight over the last several months due to dietary indiscretion and decreased activity in the setting of COVID-19 pandemic (BMI > 40 with multiple comorbidities; DM, HTN, OSA).  I encouraged him to increase his activity, as tolerated, and return to his former eating habits that had helped him lose weight.  Follow-up: Return to clinic in 1 year.  Nelva Bush, MD 09/15/2018 11:05 AM

## 2018-09-15 NOTE — Patient Instructions (Signed)
Medication Instructions:  Your physician recommends that you continue on your current medications as directed. Please refer to the Current Medication list given to you today.  If you need a refill on your cardiac medications before your next appointment, please call your pharmacy.   Lab work: - None ordered.  If you have labs (blood work) drawn today and your tests are completely normal, you will receive your results only by: Marland Kitchen MyChart Message (if you have MyChart) OR . A paper copy in the mail If you have any lab test that is abnormal or we need to change your treatment, we will call you to review the results.  Testing/Procedures: - None ordered.   Follow-Up: At [facility], you and your health needs are our priority.  As part of our continuing mission to provide you with exceptional heart care, we have created designated Provider Care Teams.  These Care Teams include your primary Cardiologist (physician) and Advanced Practice Providers (APPs -  Physician Assistants and Nurse Practitioners) who all work together to provide you with the care you need, when you need it. You will need a follow up appointment in 12 months.  Please call our office 2 months in advance to schedule this appointment.  You may see DR Harrell Gave END or one of the following Advanced Practice Providers on your designated Care Team:   Murray Hodgkins, NP Christell Faith, PA-C . Marrianne Mood, PA-C

## 2018-09-16 ENCOUNTER — Encounter: Payer: Self-pay | Admitting: Internal Medicine

## 2018-09-16 DIAGNOSIS — E785 Hyperlipidemia, unspecified: Secondary | ICD-10-CM | POA: Insufficient documentation

## 2018-09-25 ENCOUNTER — Other Ambulatory Visit: Payer: Self-pay

## 2018-09-25 ENCOUNTER — Encounter: Payer: Self-pay | Admitting: Family Medicine

## 2018-09-25 ENCOUNTER — Ambulatory Visit (INDEPENDENT_AMBULATORY_CARE_PROVIDER_SITE_OTHER): Payer: Medicare Other

## 2018-09-25 VITALS — Temp 96.9°F | Ht 74.0 in | Wt 320.0 lb

## 2018-09-25 DIAGNOSIS — Z0000 Encounter for general adult medical examination without abnormal findings: Secondary | ICD-10-CM

## 2018-09-25 NOTE — Progress Notes (Signed)
PCP notes:  Health Maintenance:  No gaps  Abnormal Screenings:  None  Patient concerns:  None  Nurse concerns:  None  Next PCP appt.: 10/02/2018 at 8:30

## 2018-09-25 NOTE — Patient Instructions (Signed)
Dennis Ayers , Thank you for taking time to come for your Medicare Wellness Visit. I appreciate your ongoing commitment to your health goals. Please review the following plan we discussed and let me know if I can assist you in the future.   Screening recommendations/referrals: Colonoscopy: 10/2017 Recommended yearly ophthalmology/optometry visit for glaucoma screening and checkup Recommended yearly dental visit for hygiene and checkup  Vaccinations: Influenza vaccine: 09/2017 Pneumococcal vaccine: 10/2015 Tdap vaccine: 06/2015 Shingles vaccine: 02/2018    Advanced directives: copy in chart  Conditions/risks identified: obesity  Next appointment: 10/02/2018 at 8:30  Preventive Care 69 Years and Older, Male Preventive care refers to lifestyle choices and visits with your health care provider that can promote health and wellness. What does preventive care include?  A yearly physical exam. This is also called an annual well check.  Dental exams once or twice a year.  Routine eye exams. Ask your health care provider how often you should have your eyes checked.  Personal lifestyle choices, including:  Daily care of your teeth and gums.  Regular physical activity.  Eating a healthy diet.  Avoiding tobacco and drug use.  Limiting alcohol use.  Practicing safe sex.  Taking low doses of aspirin every day.  Taking vitamin and mineral supplements as recommended by your health care provider. What happens during an annual well check? The services and screenings done by your health care provider during your annual well check will depend on your age, overall health, lifestyle risk factors, and family history of disease. Counseling  Your health care provider may ask you questions about your:  Alcohol use.  Tobacco use.  Drug use.  Emotional well-being.  Home and relationship well-being.  Sexual activity.  Eating habits.  History of falls.  Memory and ability to understand  (cognition).  Work and work Statistician. Screening  You may have the following tests or measurements:  Height, weight, and BMI.  Blood pressure.  Lipid and cholesterol levels. These may be checked every 5 years, or more frequently if you are over 35 years old.  Skin check.  Lung cancer screening. You may have this screening every year starting at age 90 if you have a 30-pack-year history of smoking and currently smoke or have quit within the past 15 years.  Fecal occult blood test (FOBT) of the stool. You may have this test every year starting at age 29.  Flexible sigmoidoscopy or colonoscopy. You may have a sigmoidoscopy every 5 years or a colonoscopy every 10 years starting at age 86.  Prostate cancer screening. Recommendations will vary depending on your family history and other risks.  Hepatitis C blood test.  Hepatitis B blood test.  Sexually transmitted disease (STD) testing.  Diabetes screening. This is done by checking your blood sugar (glucose) after you have not eaten for a while (fasting). You may have this done every 1-3 years.  Abdominal aortic aneurysm (AAA) screening. You may need this if you are a current or former smoker.  Osteoporosis. You may be screened starting at age 52 if you are at high risk. Talk with your health care provider about your test results, treatment options, and if necessary, the need for more tests. Vaccines  Your health care provider may recommend certain vaccines, such as:  Influenza vaccine. This is recommended every year.  Tetanus, diphtheria, and acellular pertussis (Tdap, Td) vaccine. You may need a Td booster every 10 years.  Zoster vaccine. You may need this after age 29.  Pneumococcal 13-valent conjugate (  PCV13) vaccine. One dose is recommended after age 84.  Pneumococcal polysaccharide (PPSV23) vaccine. One dose is recommended after age 25. Talk to your health care provider about which screenings and vaccines you need and  how often you need them. This information is not intended to replace advice given to you by your health care provider. Make sure you discuss any questions you have with your health care provider. Document Released: 03/07/2015 Document Revised: 10/29/2015 Document Reviewed: 12/10/2014 Elsevier Interactive Patient Education  2017 [place] Prevention in the Home Falls can cause injuries. They can happen to people of all ages. There are many things you can do to make your home safe and to help prevent falls. What can I do on the outside of my home?  Regularly fix the edges of walkways and driveways and fix any cracks.  Remove anything that might make you trip as you walk through a door, such as a raised step or threshold.  Trim any bushes or trees on the path to your home.  Use bright outdoor lighting.  Clear any walking paths of anything that might make someone trip, such as rocks or tools.  Regularly check to see if handrails are loose or broken. Make sure that both sides of any steps have handrails.  Any raised decks and porches should have guardrails on the edges.  Have any leaves, snow, or ice cleared regularly.  Use sand or salt on walking paths during winter.  Clean up any spills in your garage right away. This includes oil or grease spills. What can I do in the bathroom?  Use night lights.  Install grab bars by the toilet and in the tub and shower. Do not use towel bars as grab bars.  Use non-skid mats or decals in the tub or shower.  If you need to sit down in the shower, use a plastic, non-slip stool.  Keep the floor dry. Clean up any water that spills on the floor as soon as it happens.  Remove soap buildup in the tub or shower regularly.  Attach bath mats securely with double-sided non-slip rug tape.  Do not have throw rugs and other things on the floor that can make you trip. What can I do in the bedroom?  Use night lights.  Make sure that you  have a light by your bed that is easy to reach.  Do not use any sheets or blankets that are too big for your bed. They should not hang down onto the floor.  Have a firm chair that has side arms. You can use this for support while you get dressed.  Do not have throw rugs and other things on the floor that can make you trip. What can I do in the kitchen?  Clean up any spills right away.  Avoid walking on wet floors.  Keep items that you use a lot in easy-to-reach places.  If you need to reach something above you, use a strong step stool that has a grab bar.  Keep electrical cords out of the way.  Do not use floor polish or wax that makes floors slippery. If you must use wax, use non-skid floor wax.  Do not have throw rugs and other things on the floor that can make you trip. What can I do with my stairs?  Do not leave any items on the stairs.  Make sure that there are handrails on both sides of the stairs and use them. Fix handrails that  are broken or loose. Make sure that handrails are as long as the stairways.  Check any carpeting to make sure that it is firmly attached to the stairs. Fix any carpet that is loose or worn.  Avoid having throw rugs at the top or bottom of the stairs. If you do have throw rugs, attach them to the floor with carpet tape.  Make sure that you have a light switch at the top of the stairs and the bottom of the stairs. If you do not have them, ask someone to add them for you. What else can I do to help prevent falls?  Wear shoes that:  Do not have high heels.  Have rubber bottoms.  Are comfortable and fit you well.  Are closed at the toe. Do not wear sandals.  If you use a stepladder:  Make sure that it is fully opened. Do not climb a closed stepladder.  Make sure that both sides of the stepladder are locked into place.  Ask someone to hold it for you, if possible.  Clearly mark and make sure that you can see:  Any grab bars or  handrails.  First and last steps.  Where the edge of each step is.  Use tools that help you move around (mobility aids) if they are needed. These include:  Canes.  Walkers.  Scooters.  Crutches.  Turn on the lights when you go into a dark area. Replace any light bulbs as soon as they burn out.  Set up your furniture so you have a clear path. Avoid moving your furniture around.  If any of your floors are uneven, fix them.  If there are any pets around you, be aware of where they are.  Review your medicines with your doctor. Some medicines can make you feel dizzy. This can increase your chance of falling. Ask your doctor what other things that you can do to help prevent falls. This information is not intended to replace advice given to you by your health care provider. Make sure you discuss any questions you have with your health care provider. Document Released: 12/05/2008 Document Revised: 07/17/2015 Document Reviewed: 03/15/2014 Elsevier Interactive Patient Education  2017 [employer].

## 2018-09-25 NOTE — Progress Notes (Signed)
Subjective:   Dennis Ayers is a 68 y.o. male who presents for Medicare Annual/Subsequent preventive examination.  This visit type was conducted due to national recommendations for restrictions regarding the COVID-19 Pandemic (e.g. social distancing). This format is felt to be most appropriate for this patient at this time. All issues noted in this document were discussed and addressed. No physical exam was performed (except for noted visual exam findings with Video Visits). The patient, Dennis Ayers, has given consent to perform this visit via video. Vital signs may be absent or patient reported.   Patient location:  At home   Nurse location:  At home   Review of Systems:  n/a Cardiac Risk Factors include: diabetes mellitus;dyslipidemia;male gender;hypertension;sedentary lifestyle;obesity (BMI >30kg/m2)     Objective:    Vitals: Temp (!) 96.9 F (36.1 C) Comment: per patient  Ht 6\' 2"  (1.88 m)   Wt (!) 320 lb (145.2 kg) Comment: per patient  BMI 41.09 kg/m   Body mass index is 41.09 kg/m.  Advanced Directives 09/25/2018 09/09/2017 10/29/2016 10/21/2016 10/21/2016 10/15/2016 09/03/2016  Does Patient Have a Medical Advance Directive? Yes Yes Yes Yes Yes Yes Yes  Type of Paramedic of [place];Living will [place];Living will [place];Living will Living will;Healthcare Power of [place];Living will Living will;Healthcare Power of [place];Living will  Does patient want to make changes to medical advance directive? - - - - - - -  Copy of [place] in Chart? Yes - validated most recent copy scanned in chart (See row information) Yes No - copy requested No - copy requested No - copy requested - Yes  Pre-existing out of facility DNR order (yellow form or pink MOST form) - - - - - - -    Tobacco Social History   Tobacco Use  Smoking Status  Never Smoker  Smokeless Tobacco Never Used     Counseling given: Not Answered   Clinical Intake:  Pre-visit preparation completed: Yes  Pain : No/denies pain     Nutritional Status: BMI > 30  Obese Nutritional Risks: None Diabetes: Yes CBG done?: No Did pt. bring in CBG monitor from home?: No  How often do you need to have someone help you when you read instructions, pamphlets, or other written materials from your doctor or pharmacy?: 1 - Never What is the last grade level you completed in school?: associate degree  Interpreter Needed?: No  Information entered by :: NAllen LPN  Past Medical History:  Diagnosis Date  . Benign neoplasm of cecum - 25 mm polyp 08/06/2015  . Coronary artery calcification   . Diabetes mellitus without complication ([place])   . DJD (degenerative joint disease) of knee    [place] steroid injection (01/2014)  . Family history of ovarian cancer   . Hyperglycemia   . Hypertension   . Jaundice    with icterus, when very young, treated and resolved  . Knee pain   . Morbid obesity ([place])   . OSA (obstructive sleep apnea)    mild to mod, working on weight loss, no cpap needed per test  . Osteoarthritis of knee 07/23/2011   blat s/p L TKR ([place])  . Redundant colon 11/16/2017  . Severe obesity (BMI >= 40) ([place]) 02/15/2008  . Sleep apnea    does not have a cpap  . Viral hepatitis 8th grade   has had jaundice in past, unsure of  cause ?Hep A   Past Surgical History:  Procedure Laterality Date  . CHOLECYSTECTOMY  1998  . COLONOSCOPY  08/20/2011   4 adenomatous polyps - rpt 3 yrs Gatha Mayer)  . COLONOSCOPY  07-22-15   mult TAs, severe diverticulosis, one large 2.5cm cecal polyp pending resection Carlean Purl)  . COLONOSCOPY N/A 09/10/2015   large TA, rpt 1 yr Gatha Mayer, MD)  . COLONOSCOPY  11/2017   7 TAs, 3 SSPs, diverticulosis, rpt 2 yrs Carlean Purl)  . COLONOSCOPY WITH PROPOFOL N/A 10/29/2016   TA, rpt 1 yr Carlean Purl, Ofilia Neas, MD)  . EYE SURGERY  Right    laser  . HOT HEMOSTASIS N/A 10/29/2016   Procedure: HOT HEMOSTASIS (ARGON PLASMA COAGULATION/BICAP);  Surgeon: Gatha Mayer, MD;  Location: [facility];  Service: Endoscopy;  Laterality: N/A;  . TONSILLECTOMY    . TOTAL KNEE ARTHROPLASTY Left 07/25/2012   Surgeon: Johnny Bridge, MD  . 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 Alphonzo Severance   Family History  Problem Relation Age of Onset  . Ovarian cancer Mother   . Ulcers Mother        stomach  . Coronary artery disease Father 22       CABG X4  . Hypertension Father   . Stroke Father 34  . Heart attack Father 37  . Heart attack Sister 14  . Heart Problems Paternal Aunt   . Heart attack Paternal Uncle   . Heart attack Paternal Aunt   . Heart attack Paternal Uncle   . Diabetes Neg Hx   . Prostate cancer Neg Hx   . Breast cancer Neg Hx   . Colon cancer Neg Hx   . Depression Neg Hx   . Alcohol abuse Neg Hx   . Colon polyps Neg Hx    Social History   Socioeconomic History  . Marital status: Married    Spouse name: Not on file  . Number of children: 1  . Years of education: Not on file  . Highest education level: Not on file  Occupational History  . Occupation: Self-employed  Social Needs  . Financial resource strain: Not hard at all  . Food insecurity    Worry: Never true    Inability: Never true  . Transportation needs    Medical: No    Non-medical: No  Tobacco Use  . Smoking status: Never Smoker  . Smokeless tobacco: Never Used  Substance and Sexual Activity  . Alcohol use: No  . Drug use: No  . Sexual activity: Not Currently  Lifestyle  . Physical activity    Days per week: 0 days    Minutes per session: 0 min  . Stress: Not at all  Relationships  . Social Herbalist on phone: Not on file    Gets together: Not on file    Attends religious service: Not on file    Active member of club or organization: Not on file    Attends meetings of clubs or organizations: Not on file    Relationship  status: Not on file  Other Topics Concern  . Not on file  Social History Narrative   Caffeine: 2-3 diet sodas/day   Lives with wife   1 grown son.   Occupation: Self-employed; Psychologist, forensic   Activity: some golf, limited by knee pain   Diet: good water, daily vegetables    Outpatient Encounter Medications as of 09/25/2018  Medication Sig  .  aspirin EC 81 MG tablet Take 81 mg by mouth daily.  Marland Kitchen atorvastatin (LIPITOR) 40 MG tablet Take 1 tablet (40 mg total) by mouth daily at 6 PM.  . Continuous Blood Gluc Sensor (FREESTYLE LIBRE 14 DAY SENSOR) MISC 1 Units by Does not apply route every 14 (fourteen) days.  . DiphenhydrAMINE HCl, Sleep, (UNISOM SLEEPMELTS) 25 MG TBDP Take 25 mg by mouth at bedtime.   Marland Kitchen glucose blood (ONE TOUCH ULTRA TEST) test strip USE AS INSTRUCTED TO CHECK BLOOD SUGAR ONCE A DAY AS NEEDED. DX CODE: E11.9  . ibuprofen (ADVIL,MOTRIN) 200 MG tablet Take 600 mg by mouth daily as needed for moderate pain.  Javier Docker Oil 300 MG CAPS Take 300 mg by mouth daily.  Marland Kitchen losartan (COZAAR) 100 MG tablet TAKE ONE-HALF TABLET DAILY  . metFORMIN (GLUCOPHAGE) 500 MG tablet Take 1 tablet (500 mg total) by mouth daily with breakfast.  . Misc Natural Products (OSTEO BI-FLEX JOINT SHIELD PO) Take 1 capsule by mouth daily.   . Multiple Vitamin (MULTIVITAMIN) tablet Take 1 tablet by mouth daily.  Glory Rosebush DELICA LANCETS 50N MISC Use as directed to check blood sugar once daily as needed.  Dx code: E11.9   No facility-administered encounter medications on file as of 09/25/2018.     Activities of Daily Living In your present state of health, do you have any difficulty performing the following activities: 09/25/2018  Hearing? N  Vision? N  Difficulty concentrating or making decisions? N  Walking or climbing stairs? N  Dressing or bathing? N  Doing errands, shopping? N  Preparing Food and eating ? N  Using the Toilet? N  In the past six months, have you accidently  leaked urine? N  Do you have problems with loss of bowel control? N  Managing your Medications? N  Managing your Finances? N  Housekeeping or managing your Housekeeping? N  Some recent data might be hidden    Patient Care Team: Ria Bush, MD as PCP - General (Family Medicine) Marchia Bond, MD as Consulting Physician (Orthopedic Surgery) End, Harrell Gave, MD as Consulting Physician (Cardiology) Gatha Mayer, MD as Consulting Physician (Gastroenterology) Macarthur Critchley, OD as Consulting Physician (Optometry)   Assessment:   This is a routine wellness examination for [place].  Exercise Activities and Dietary recommendations Current Exercise Habits: The patient does not participate in regular exercise at present  Goals    . Increase physical activity (pt-stated)     Starting 09/03/2016, I will continue to stay active working in the yard and try to increase walking as tolerated. I will also continue following a low carbohydrate diet in an effort to lose weight and control my blood sugar.    . Patient Stated     Starting 09/09/2017, I will continue to take medications as prescribed.        Fall Risk Fall Risk  09/25/2018 09/09/2017 09/03/2016 07/10/2015  Falls in the past year? 1 No No No  Injury with Fall? 0 - - -  Comment lost balance - - -  Risk for fall due to : History of fall(s);Medication side effect - - -  Follow up Falls evaluation completed;Falls prevention discussed - - -   Is the patient's home free of loose throw rugs in walkways, pet beds, electrical cords, etc?   yes      Grab bars in the bathroom? no      Handrails on the stairs?   yes      Adequate lighting?  yes  Timed Get Up and Go Performed: n/a  Depression Screen PHQ 2/9 Scores 09/25/2018 09/09/2017 09/03/2016 07/10/2015  PHQ - 2 Score 0 0 0 0  PHQ- 9 Score 1 0 - -    Cognitive Function MMSE - Mini Mental State Exam 09/25/2018 09/09/2017 09/03/2016  Orientation to time 5 5 5   Orientation to Place 5 5 5    Registration 3 3 3   Attention/ Calculation 5 0 0  Recall 3 3 3   Language- name 2 objects 0 0 0  Language- repeat 1 1 1   Language- follow 3 step command 0 3 3  Language- read & follow direction 0 0 0  Write a sentence 0 0 0  Copy design 0 0 0  Total score 22 20 20    Mini Cog  Mini-Cog screen was completed. Maximum score is 22. A value of 0 denotes this part of the MMSE was not completed or the patient failed this part of the Mini-Cog screening.       Immunization History  Administered Date(s) Administered  . Hepatitis B, adult 03/11/2017, 06/14/2017, 10/18/2017  . Influenza Whole 11/23/2011  . Influenza, High Dose Seasonal PF 11/03/2014, 11/21/2015, 10/01/2016  . Influenza-Unspecified 09/30/2017  . Pneumococcal Conjugate-13 07/10/2015  . Pneumococcal Polysaccharide-23 11/21/2015  . Td 03/23/2002  . Tdap 07/10/2015  . Zoster 07/23/2011  . Zoster Recombinat (Shingrix) 11/18/2017, 02/22/2018    Qualifies for Shingles Vaccine? yes  Screening Tests Health Maintenance  Topic Date Due  . INFLUENZA VACCINE  09/23/2018  . HEMOGLOBIN A1C  09/25/2018  . FOOT EXAM  03/28/2019  . OPHTHALMOLOGY EXAM  07/04/2019  . COLONOSCOPY  11/17/2019  . DTaP/Tdap/Td (2 - Td) 07/09/2025  . TETANUS/TDAP  07/09/2025  . Hepatitis C Screening  Completed  . PNA vac Low Risk Adult  Completed   Cancer Screenings: Lung: Low Dose CT Chest recommended if Age 25-80 years, 30 pack-year currently smoking OR have quit w/in 15years. Patient does not qualify. Colorectal: up to date  Additional Screenings:  Hepatitis C Screening:06/2015      Plan:   Once heat dies down will start working back out in the yard regularly.  I have personally reviewed and noted the following in the patient's chart:   . Medical and social history . Use of alcohol, tobacco or illicit drugs  . Current medications and supplements . Functional ability and status . Nutritional status . Physical activity . Advanced  directives . List of other physicians . Hospitalizations, surgeries, and ER visits in previous 12 months . Vitals . Screenings to include cognitive, depression, and falls . Referrals and appointments  In addition, I have reviewed and discussed with patient certain preventive protocols, quality metrics, and best practice recommendations. A written personalized care plan for preventive services as well as general preventive health recommendations were provided to patient.     Kellie Simmering, LPN  1/0/2725

## 2018-09-27 ENCOUNTER — Other Ambulatory Visit: Payer: Self-pay | Admitting: Family Medicine

## 2018-09-27 DIAGNOSIS — D751 Secondary polycythemia: Secondary | ICD-10-CM

## 2018-09-27 DIAGNOSIS — Z125 Encounter for screening for malignant neoplasm of prostate: Secondary | ICD-10-CM

## 2018-09-27 DIAGNOSIS — E118 Type 2 diabetes mellitus with unspecified complications: Secondary | ICD-10-CM

## 2018-09-27 DIAGNOSIS — E785 Hyperlipidemia, unspecified: Secondary | ICD-10-CM

## 2018-09-28 ENCOUNTER — Other Ambulatory Visit (INDEPENDENT_AMBULATORY_CARE_PROVIDER_SITE_OTHER): Payer: Medicare Other

## 2018-09-28 ENCOUNTER — Other Ambulatory Visit: Payer: Self-pay

## 2018-09-28 DIAGNOSIS — E785 Hyperlipidemia, unspecified: Secondary | ICD-10-CM

## 2018-09-28 DIAGNOSIS — D751 Secondary polycythemia: Secondary | ICD-10-CM | POA: Diagnosis not present

## 2018-09-28 DIAGNOSIS — E118 Type 2 diabetes mellitus with unspecified complications: Secondary | ICD-10-CM | POA: Diagnosis not present

## 2018-09-28 DIAGNOSIS — Z125 Encounter for screening for malignant neoplasm of prostate: Secondary | ICD-10-CM

## 2018-09-28 LAB — CBC WITH DIFFERENTIAL/PLATELET
Basophils Absolute: 0 10*3/uL (ref 0.0–0.1)
Basophils Relative: 0.8 % (ref 0.0–3.0)
Eosinophils Absolute: 0.2 10*3/uL (ref 0.0–0.7)
Eosinophils Relative: 2.8 % (ref 0.0–5.0)
HCT: 47.9 % (ref 39.0–52.0)
Hemoglobin: 16.4 g/dL (ref 13.0–17.0)
Lymphocytes Relative: 40.9 % (ref 12.0–46.0)
Lymphs Abs: 2.2 10*3/uL (ref 0.7–4.0)
MCHC: 34.3 g/dL (ref 30.0–36.0)
MCV: 94.6 fl (ref 78.0–100.0)
Monocytes Absolute: 0.5 10*3/uL (ref 0.1–1.0)
Monocytes Relative: 9.3 % (ref 3.0–12.0)
Neutro Abs: 2.5 10*3/uL (ref 1.4–7.7)
Neutrophils Relative %: 46.2 % (ref 43.0–77.0)
Platelets: 213 10*3/uL (ref 150.0–400.0)
RBC: 5.06 Mil/uL (ref 4.22–5.81)
RDW: 13.4 % (ref 11.5–15.5)
WBC: 5.3 10*3/uL (ref 4.0–10.5)

## 2018-09-28 LAB — COMPREHENSIVE METABOLIC PANEL
ALT: 28 U/L (ref 0–53)
AST: 17 U/L (ref 0–37)
Albumin: 4.4 g/dL (ref 3.5–5.2)
Alkaline Phosphatase: 67 U/L (ref 39–117)
BUN: 21 mg/dL (ref 6–23)
CO2: 27 mEq/L (ref 19–32)
Calcium: 9.4 mg/dL (ref 8.4–10.5)
Chloride: 104 mEq/L (ref 96–112)
Creatinine, Ser: 0.93 mg/dL (ref 0.40–1.50)
GFR: 80.58 mL/min (ref >60.00)
Glucose, Bld: 143 mg/dL — ABNORMAL HIGH (ref 70–99)
Potassium: 4.3 mEq/L (ref 3.5–5.1)
Sodium: 140 mEq/L (ref 135–145)
Total Bilirubin: 0.8 mg/dL (ref 0.2–1.2)
Total Protein: 6.9 g/dL (ref 6.0–8.3)

## 2018-09-28 LAB — PSA, MEDICARE: PSA: 2.06 ng/ml (ref 0.10–4.00)

## 2018-09-28 LAB — LIPID PANEL
Cholesterol: 100 mg/dL (ref 0–200)
HDL: 51.3 mg/dL (ref >39.00)
LDL Cholesterol: 40 mg/dL (ref 0–99)
NonHDL: 48.4
Total CHOL/HDL Ratio: 2
Triglycerides: 40 mg/dL (ref 0.0–149.0)
VLDL: 8 mg/dL (ref 0.0–40.0)

## 2018-09-28 LAB — HEMOGLOBIN A1C: Hgb A1c MFr Bld: 5.9 % (ref 4.6–6.5)

## 2018-10-02 ENCOUNTER — Ambulatory Visit (INDEPENDENT_AMBULATORY_CARE_PROVIDER_SITE_OTHER): Payer: Medicare Other | Admitting: Family Medicine

## 2018-10-02 ENCOUNTER — Encounter: Payer: Self-pay | Admitting: Family Medicine

## 2018-10-02 ENCOUNTER — Other Ambulatory Visit: Payer: Self-pay

## 2018-10-02 VITALS — BP 130/68 | HR 96 | Temp 97.9°F | Ht 73.5 in | Wt 324.6 lb

## 2018-10-02 DIAGNOSIS — I10 Essential (primary) hypertension: Secondary | ICD-10-CM

## 2018-10-02 DIAGNOSIS — Z0000 Encounter for general adult medical examination without abnormal findings: Secondary | ICD-10-CM | POA: Diagnosis not present

## 2018-10-02 DIAGNOSIS — E118 Type 2 diabetes mellitus with unspecified complications: Secondary | ICD-10-CM | POA: Diagnosis not present

## 2018-10-02 DIAGNOSIS — E785 Hyperlipidemia, unspecified: Secondary | ICD-10-CM

## 2018-10-02 DIAGNOSIS — G4733 Obstructive sleep apnea (adult) (pediatric): Secondary | ICD-10-CM

## 2018-10-02 DIAGNOSIS — I77810 Thoracic aortic ectasia: Secondary | ICD-10-CM

## 2018-10-02 DIAGNOSIS — Z86010 Personal history of colonic polyps: Secondary | ICD-10-CM

## 2018-10-02 MED ORDER — ATORVASTATIN CALCIUM 40 MG PO TABS: 40.0000 mg | ORAL_TABLET | Freq: Every day | ORAL | 3 refills | Status: DC

## 2018-10-02 MED ORDER — METFORMIN HCL 500 MG PO TABS: 500.0000 mg | ORAL_TABLET | Freq: Every day | ORAL | 3 refills | Status: DC

## 2018-10-02 MED ORDER — FREESTYLE LIBRE 14 DAY SENSOR MISC: 1.0000 [IU] | 3 refills | Status: DC

## 2018-10-02 MED ORDER — LOSARTAN POTASSIUM 100 MG PO TABS: 50.0000 mg | ORAL_TABLET | Freq: Every day | ORAL | 3 refills | Status: DC

## 2018-10-02 NOTE — Telephone Encounter (Signed)
Seen today. 

## 2018-10-02 NOTE — Patient Instructions (Addendum)
You are doing well today.  Continue walking routine.  May try melatonin 5mg  for sleep as well.  Return as needed or in 6 months for diabetes follow up visit.   Health Maintenance After Age 69 After age 14, you are at a higher risk for certain long-term diseases and infections as well as injuries from falls. Falls are a major cause of broken bones and head injuries in people who are older than age 62. Getting regular preventive care can help to keep you healthy and well. Preventive care includes getting regular testing and making lifestyle changes as recommended by your health care provider. Talk with your health care provider about:  Which screenings and tests you should have. A screening is a test that checks for a disease when you have no symptoms.  A diet and exercise plan that is right for you. What should I know about screenings and tests to prevent falls? Screening and testing are the best ways to find a health problem early. Early diagnosis and treatment give you the best chance of managing medical conditions that are common after age 10. Certain conditions and lifestyle choices may make you more likely to have a fall. Your health care provider may recommend:  Regular vision checks. Poor vision and conditions such as cataracts can make you more likely to have a fall. If you wear glasses, make sure to get your prescription updated if your vision changes.  Medicine review. Work with your health care provider to regularly review all of the medicines you are taking, including over-the-counter medicines. Ask your health care provider about any side effects that may make you more likely to have a fall. Tell your health care provider if any medicines that you take make you feel dizzy or sleepy.  Osteoporosis screening. Osteoporosis is a condition that causes the bones to get weaker. This can make the bones weak and cause them to break more easily.  Blood pressure screening. Blood pressure changes  and medicines to control blood pressure can make you feel dizzy.  Strength and balance checks. Your health care provider may recommend certain tests to check your strength and balance while standing, walking, or changing positions.  Foot health exam. Foot pain and numbness, as well as not wearing proper footwear, can make you more likely to have a fall.  Depression screening. You may be more likely to have a fall if you have a fear of falling, feel emotionally low, or feel unable to do activities that you used to do.  Alcohol use screening. Using too much alcohol can affect your balance and may make you more likely to have a fall. What actions can I take to lower my risk of falls? General instructions  Talk with your health care provider about your risks for falling. Tell your health care provider if: ? You fall. Be sure to tell your health care provider about all falls, even ones that seem minor. ? You feel dizzy, sleepy, or off-balance.  Take over-the-counter and prescription medicines only as told by your health care provider. These include any supplements.  Eat a healthy diet and maintain a healthy weight. A healthy diet includes low-fat dairy products, low-fat (lean) meats, and fiber from whole grains, beans, and lots of fruits and vegetables. Home safety  Remove any tripping hazards, such as rugs, cords, and clutter.  Install safety equipment such as grab bars in bathrooms and safety rails on stairs.  Keep rooms and walkways well-lit. Activity   Follow a  regular exercise program to stay fit. This will help you maintain your balance. Ask your health care provider what types of exercise are appropriate for you.  If you need a cane or walker, use it as recommended by your health care provider.  Wear supportive shoes that have nonskid soles. Lifestyle  Do not drink alcohol if your health care provider tells you not to drink.  If you drink alcohol, limit how much you  have: ? 0-1 drink a day for women. ? 0-2 drinks a day for men.  Be aware of how much alcohol is in your drink. In the [place] one drink equals one typical bottle of beer (12 oz), one-half glass of wine (5 oz), or one shot of hard liquor (1 oz).  Do not use any products that contain nicotine or tobacco, such as cigarettes and e-cigarettes. If you need help quitting, ask your health care provider. Summary  Having a healthy lifestyle and getting preventive care can help to protect your health and wellness after age 24.  Screening and testing are the best way to find a health problem early and help you avoid having a fall. Early diagnosis and treatment give you the best chance for managing medical conditions that are more common for people who are older than age 46.  Falls are a major cause of broken bones and head injuries in people who are older than age 47. Take precautions to prevent a fall at home.  Work with your health care provider to learn what changes you can make to improve your health and wellness and to prevent falls. This information is not intended to replace advice given to you by your health care provider. Make sure you discuss any questions you have with your health care provider. Document Released: 12/22/2016 Document Revised: 06/01/2018 Document Reviewed: 12/22/2016 Elsevier Patient Education  2020 [employer].

## 2018-10-02 NOTE — Assessment & Plan Note (Signed)
Chronic, stable. Continue current regimen. Initial A1c 8.4% (2018).

## 2018-10-02 NOTE — Progress Notes (Signed)
This visit was conducted in person.  BP 130/68 (BP Location: Right Arm, Patient Position: Sitting, Cuff Size: Large)   Pulse 96   Temp 97.9 F (36.6 C) (Temporal)   Ht 6' 1.5" (1.867 m)   Wt (!) 324 lb 9 oz (147.2 kg)   SpO2 96%   BMI 42.24 kg/m    CC: CPE  Subjective:    Patient ID: Dennis Ayers, male    DOB: 12-10-1950, 68 y.o.   MRN: [id number]  HPI: Dennis Ayers is a 68 y.o. male presenting on 10/02/2018 for Annual Exam (Pt 2. ) and Medication Refill (Needs Libre Sensor refills sent to [facility].)   Saw health advisor last week for medicare wellness visit. Note reviewed.   Fully retired 01/2018! Saw Dr End last month.  Some ongoing insomnia - wakes up at 2-3am to void, then trouble falling asleep.   DM - sent freestyle libre CGM readings through [employer]. 90d average 100-118! No lows.   Preventative: COLONOSCOPY Date:06/2015 mult TAs, severe diverticulosis, 1 large 2.5cm cecal polyp pending resection([facility])- 08/2015 large TA, rpt 1 yr Carlean Purl) COLONOSCOPY WITH PROPOFOL 10/29/2016 TA, rpt 1 yr Carlean Purl, Ofilia Neas, MD) COLONOSCOPY 11/2017 - 7 TAs, 3 SSPs, diverticulosis, rpt 2 yrs Carlean Purl) Prostate cancer screening - discussed. Will screen.  Lung cancer screening - never smoker  AAA screen - never smoker, no fmhx AAA  [facility], Tdap5/2017 prevnar 06/2015, pneumovax 10/2015. Zostavax- 2013 Shingrix - completed 02/2018 Hep B - completed  Advanced directive discussion - has at home. HCPOA iswifeand son.No prolonged life support if terminal illness. Copy in chart 08/2015. Seat belt use discussed  Sunscreen use discussed, no changing moles on skin.  Non smoker Alcohol - none Dentist - Q6 mo Eye exam - yearly  Caffeine: 2-3 diet sodas/day Lives with wife 1 grown son. Occupation: Self-employed; Psychologist, forensic Activity: some golf, limited by knee pain  Diet: good water, daily vegetables     Relevant past  medical, surgical, family and social history reviewed and updated as indicated. Interim medical history since our last visit reviewed. Allergies and medications reviewed and updated. Outpatient Medications Prior to Visit  Medication Sig Dispense Refill  . aspirin EC 81 MG tablet Take 81 mg by mouth daily.    . DiphenhydrAMINE HCl, Sleep, (UNISOM SLEEPMELTS) 25 MG TBDP Take 25 mg by mouth at bedtime.     Marland Kitchen glucose blood (ONE TOUCH ULTRA TEST) test strip USE AS INSTRUCTED TO CHECK BLOOD SUGAR ONCE A DAY AS NEEDED. DX CODE: E11.9 25 each 3  . ibuprofen (ADVIL,MOTRIN) 200 MG tablet Take 600 mg by mouth daily as needed for moderate pain.    Vansh Reckart Docker Oil 300 MG CAPS Take 300 mg by mouth daily.    . Misc Natural Products (OSTEO BI-FLEX JOINT SHIELD PO) Take 1 capsule by mouth daily.     . Multiple Vitamin (MULTIVITAMIN) tablet Take 1 tablet by mouth daily.    Glory Rosebush DELICA LANCETS 94R MISC Use as directed to check blood sugar once daily as needed.  Dx code: E11.9 100 each 1  . atorvastatin (LIPITOR) 40 MG tablet Take 1 tablet (40 mg total) by mouth daily at 6 PM. 30 tablet 3  . Continuous Blood Gluc Sensor (FREESTYLE LIBRE 14 DAY SENSOR) MISC 1 Units by Does not apply route every 14 (fourteen) days. 6 each 3  . losartan (COZAAR) 100 MG tablet TAKE ONE-HALF TABLET DAILY 45 tablet 1  .  metFORMIN (GLUCOPHAGE) 500 MG tablet Take 1 tablet (500 mg total) by mouth daily with breakfast. 90 tablet 1   No facility-administered medications prior to visit.      Per HPI unless specifically indicated in ROS section below Review of Systems  Constitutional: Negative for activity change, appetite change, chills, fatigue, fever and unexpected weight change.  HENT: Negative for hearing loss.   Eyes: Negative for visual disturbance.  Respiratory: Negative for cough, chest tightness, shortness of breath and wheezing.   Cardiovascular: Negative for chest pain, palpitations and leg swelling.  Gastrointestinal:  Negative for abdominal distention, abdominal pain, blood in stool, constipation, diarrhea, nausea and vomiting.  Genitourinary: Negative for difficulty urinating and hematuria.  Musculoskeletal: Negative for arthralgias, myalgias and neck pain.  Skin: Negative for rash.  Neurological: Negative for dizziness, seizures, syncope and headaches.  Hematological: Negative for adenopathy. Does not bruise/bleed easily.  Psychiatric/Behavioral: Negative for dysphoric mood. The patient is not nervous/anxious.    Objective:    BP 130/68 (BP Location: Right Arm, Patient Position: Sitting, Cuff Size: Large)   Pulse 96   Temp 97.9 F (36.6 C) (Temporal)   Ht 6' 1.5" (1.867 m)   Wt (!) 324 lb 9 oz (147.2 kg)   SpO2 96%   BMI 42.24 kg/m   Wt Readings from Last 3 Encounters:  10/02/18 (!) 324 lb 9 oz (147.2 kg)  09/25/18 (!) 320 lb (145.2 kg)  09/15/18 (!) 323 lb 8 oz (146.7 kg)    Physical Exam Vitals signs and nursing note reviewed.  Constitutional:      General: He is not in acute distress.    Appearance: Normal appearance. He is well-developed. He is obese. He is not ill-appearing.  HENT:     Head: Normocephalic and atraumatic.     Right Ear: Hearing, tympanic membrane, ear canal and external ear normal.     Left Ear: Hearing, tympanic membrane, ear canal and external ear normal.     Nose: Nose normal.     Mouth/Throat:     Mouth: Mucous membranes are moist.     Pharynx: Uvula midline. No oropharyngeal exudate or posterior oropharyngeal erythema.  Eyes:     General: No scleral icterus.    Extraocular Movements: Extraocular movements intact.     Conjunctiva/sclera: Conjunctivae normal.     Pupils: Pupils are equal, round, and reactive to light.  Neck:     Musculoskeletal: Normal range of motion and neck supple.  Cardiovascular:     Rate and Rhythm: Normal rate and regular rhythm.     Pulses: Normal pulses.          Radial pulses are 2+ on the right side and 2+ on the left side.      Heart sounds: Normal heart sounds. No murmur.  Pulmonary:     Effort: Pulmonary effort is normal. No respiratory distress.     Breath sounds: Normal breath sounds. No wheezing, rhonchi or rales.  Abdominal:     General: Bowel sounds are normal. There is no distension.     Palpations: Abdomen is soft. There is no mass.     Tenderness: There is no abdominal tenderness. There is no guarding or rebound.     Hernia: No hernia is present.  Musculoskeletal: Normal range of motion.     Right lower leg: No edema.     Left lower leg: No edema.  Lymphadenopathy:     Cervical: No cervical adenopathy.  Skin:    General: Skin is warm  and dry.     Findings: No rash.  Neurological:     General: No focal deficit present.     Mental Status: He is alert and oriented to person, place, and time.     Comments: CN grossly intact, station and gait intact  Psychiatric:        Mood and Affect: Mood normal.        Behavior: Behavior normal.        Thought Content: Thought content normal.        Judgment: Judgment normal.    Diabetic Foot Exam - Simple   Simple Foot Form Diabetic Foot exam was performed with the following findings: Yes 10/02/2018  8:48 AM  Visual Inspection No deformities, no ulcerations, no other skin breakdown bilaterally: Yes Sensation Testing Intact to touch and monofilament testing bilaterally: Yes Pulse Check See comments: Yes Comments Diminished pulses bilateral soles        Results for orders placed or performed in visit on 09/28/18  PSA, Medicare  Result Value Ref Range   PSA 2.06 0.10 - 4.00 ng/ml  CBC with Differential/Platelet  Result Value Ref Range   WBC 5.3 4.0 - 10.5 K/uL   RBC 5.06 4.22 - 5.81 Mil/uL   Hemoglobin 16.4 13.0 - 17.0 g/dL   HCT 47.9 39.0 - 52.0 %   MCV 94.6 78.0 - 100.0 fl   MCHC 34.3 30.0 - 36.0 g/dL   RDW 13.4 11.5 - 15.5 %   Platelets 213.0 150.0 - 400.0 K/uL   Neutrophils Relative % 46.2 43.0 - 77.0 %   Lymphocytes Relative 40.9 12.0 -  46.0 %   Monocytes Relative 9.3 3.0 - 12.0 %   Eosinophils Relative 2.8 0.0 - 5.0 %   Basophils Relative 0.8 0.0 - 3.0 %   Neutro Abs 2.5 1.4 - 7.7 K/uL   Lymphs Abs 2.2 0.7 - 4.0 K/uL   Monocytes Absolute 0.5 0.1 - 1.0 K/uL   Eosinophils Absolute 0.2 0.0 - 0.7 K/uL   Basophils Absolute 0.0 0.0 - 0.1 K/uL  Hemoglobin A1c  Result Value Ref Range   Hgb A1c MFr Bld 5.9 4.6 - 6.5 %  Comprehensive metabolic panel  Result Value Ref Range   Sodium 140 135 - 145 mEq/L   Potassium 4.3 3.5 - 5.1 mEq/L   Chloride 104 96 - 112 mEq/L   CO2 27 19 - 32 mEq/L   Glucose, Bld 143 (H) 70 - 99 mg/dL   BUN 21 6 - 23 mg/dL   Creatinine, Ser 0.93 0.40 - 1.50 mg/dL   Total Bilirubin 0.8 0.2 - 1.2 mg/dL   Alkaline Phosphatase 67 39 - 117 U/L   AST 17 0 - 37 U/L   ALT 28 0 - 53 U/L   Total Protein 6.9 6.0 - 8.3 g/dL   Albumin 4.4 3.5 - 5.2 g/dL   Calcium 9.4 8.4 - 10.5 mg/dL   GFR 80.58 >60.00 mL/min  Lipid panel  Result Value Ref Range   Cholesterol 100 0 - 200 mg/dL   Triglycerides 40.0 0.0 - 149.0 mg/dL   HDL 51.30 >39.00 mg/dL   VLDL 8.0 0.0 - 40.0 mg/dL   LDL Cholesterol 40 0 - 99 mg/dL   Total CHOL/HDL Ratio 2    NonHDL 48.40    Assessment & Plan:   Problem List Items Addressed This Visit    Personal history of colonic polyps-serrated adenomas and tubular adenoma    Regular colonoscopies - next one due 2021.  OSA (obstructive sleep apnea)    Not on CPAP.       Obesity, morbid, BMI 40.0-49.9 ([place])    Continue to encourage sustainable weight loss efforts with healthy diet and regular walking routine.       Relevant Medications   metFORMIN (GLUCOPHAGE) 500 MG tablet   Hyperlipidemia LDL goal <70    Great control on current regimen - continue.       Relevant Medications   atorvastatin (LIPITOR) 40 MG tablet   losartan (COZAAR) 100 MG tablet   Health maintenance examination - Primary    Preventative protocols reviewed and updated unless pt declined. Discussed healthy diet  and lifestyle.       Essential hypertension    Chronic, stable. Continue losartan.      Relevant Medications   atorvastatin (LIPITOR) 40 MG tablet   losartan (COZAAR) 100 MG tablet   Dilated aortic root (HCC)   Relevant Medications   atorvastatin (LIPITOR) 40 MG tablet   losartan (COZAAR) 100 MG tablet   Controlled diabetes mellitus type 2 with complications (HCC)    Chronic, stable. Continue current regimen. Initial A1c 8.4% (2018).       Relevant Medications   atorvastatin (LIPITOR) 40 MG tablet   metFORMIN (GLUCOPHAGE) 500 MG tablet   losartan (COZAAR) 100 MG tablet       Meds ordered this encounter  Medications  . atorvastatin (LIPITOR) 40 MG tablet    Sig: Take 1 tablet (40 mg total) by mouth daily at 6 PM.    Dispense:  90 tablet    Refill:  3  . metFORMIN (GLUCOPHAGE) 500 MG tablet    Sig: Take 1 tablet (500 mg total) by mouth daily with breakfast.    Dispense:  90 tablet    Refill:  3  . DISCONTD: Continuous Blood Gluc Sensor (FREESTYLE LIBRE 14 DAY SENSOR) MISC    Sig: 1 Units by Does not apply route every 14 (fourteen) days.    Dispense:  6 each    Refill:  3  . Continuous Blood Gluc Sensor (FREESTYLE LIBRE 14 DAY SENSOR) MISC    Sig: 1 Units by Does not apply route every 14 (fourteen) days.    Dispense:  6 each    Refill:  3  . losartan (COZAAR) 100 MG tablet    Sig: Take 0.5 tablets (50 mg total) by mouth daily.    Dispense:  45 tablet    Refill:  3   No orders of the defined types were placed in this encounter.   Follow up plan: No follow-ups on file.  Ria Bush, MD

## 2018-10-02 NOTE — Assessment & Plan Note (Signed)
Chronic, stable. Continue losartan.  

## 2018-10-02 NOTE — Assessment & Plan Note (Signed)
Great control on current regimen - continue. 

## 2018-10-02 NOTE — Assessment & Plan Note (Signed)
Preventative protocols reviewed and updated unless pt declined. Discussed healthy diet and lifestyle.  

## 2018-10-02 NOTE — Assessment & Plan Note (Signed)
Regular colonoscopies - next one due 2021.

## 2018-10-02 NOTE — Assessment & Plan Note (Signed)
Continue to encourage sustainable weight loss efforts with healthy diet and regular walking routine.

## 2018-10-23 ENCOUNTER — Other Ambulatory Visit: Payer: Self-pay | Admitting: Family Medicine

## 2018-10-26 ENCOUNTER — Encounter: Payer: Self-pay | Admitting: Family Medicine

## 2018-10-26 NOTE — Telephone Encounter (Signed)
Updated pt's (and wife's) immunizations.  Notified pt via [place].

## 2019-03-30 ENCOUNTER — Encounter: Payer: Self-pay | Admitting: Family Medicine

## 2019-04-02 ENCOUNTER — Ambulatory Visit: Payer: Medicare Other | Attending: Internal Medicine

## 2019-04-02 DIAGNOSIS — Z23 Encounter for immunization: Secondary | ICD-10-CM | POA: Insufficient documentation

## 2019-04-02 NOTE — Progress Notes (Signed)
   Covid-19 Vaccination Clinic  Name:  Dennis Ayers    MRN: [id number] DOB: [birth year]  04/02/2019  [name] was observed post Covid-19 immunization for 15 minutes without incidence. He was provided with Vaccine Information Sheet and instruction to access the V-Safe system.   [name] was instructed to call 911 with any severe reactions post vaccine: Marland Kitchen Difficulty breathing  . Swelling of your face and throat  . A fast heartbeat  . A bad rash all over your body  . Dizziness and weakness    Immunizations Administered    Name Date Dose VIS Date Route   Pfizer COVID-19 Vaccine 04/02/2019  8:39 AM 0.3 mL 02/02/2019 Intramuscular   Manufacturer: [place]   Lot: [id number]   [place]: [id number]

## 2019-04-06 ENCOUNTER — Other Ambulatory Visit: Payer: Self-pay

## 2019-04-06 ENCOUNTER — Ambulatory Visit (INDEPENDENT_AMBULATORY_CARE_PROVIDER_SITE_OTHER): Payer: Medicare Other | Admitting: Family Medicine

## 2019-04-06 ENCOUNTER — Encounter: Payer: Self-pay | Admitting: Family Medicine

## 2019-04-06 VITALS — BP 124/76 | HR 82 | Temp 98.0°F | Ht 73.5 in | Wt 326.6 lb

## 2019-04-06 DIAGNOSIS — E118 Type 2 diabetes mellitus with unspecified complications: Secondary | ICD-10-CM

## 2019-04-06 DIAGNOSIS — E1169 Type 2 diabetes mellitus with other specified complication: Secondary | ICD-10-CM

## 2019-04-06 DIAGNOSIS — E785 Hyperlipidemia, unspecified: Secondary | ICD-10-CM | POA: Diagnosis not present

## 2019-04-06 DIAGNOSIS — I10 Essential (primary) hypertension: Secondary | ICD-10-CM

## 2019-04-06 LAB — POCT GLYCOSYLATED HEMOGLOBIN (HGB A1C): Hemoglobin A1C: 6 % — AB (ref 4.0–5.6)

## 2019-04-06 MED ORDER — FREESTYLE LIBRE 14 DAY SENSOR MISC: 1.0000 [IU] | 3 refills | Status: DC

## 2019-04-06 MED ORDER — METFORMIN HCL 500 MG PO TABS: ORAL_TABLET | ORAL | 2 refills | Status: DC

## 2019-04-06 MED ORDER — LOSARTAN POTASSIUM 50 MG PO TABS: 50.0000 mg | ORAL_TABLET | Freq: Every day | ORAL | 3 refills | Status: DC

## 2019-04-06 MED ORDER — ATORVASTATIN CALCIUM 40 MG PO TABS: 40.0000 mg | ORAL_TABLET | Freq: Every day | ORAL | 3 refills | Status: DC

## 2019-04-06 NOTE — Assessment & Plan Note (Signed)
Encouraged renewed efforts at regular exercise routine - winter weather has impeded this but planning to be more active in the garden come spring.

## 2019-04-06 NOTE — Assessment & Plan Note (Signed)
Chronic, stable. Continue losartan.  

## 2019-04-06 NOTE — Assessment & Plan Note (Signed)
Chronic, stable on atorvastatin, tolerating without myalgias. The ASCVD Risk score Mikey Bussing DC Jr., et al., 2013) failed to calculate for the following reasons:   The valid total cholesterol range is 130 to 320 mg/dL

## 2019-04-06 NOTE — Telephone Encounter (Signed)
Discussed today.

## 2019-04-06 NOTE — Progress Notes (Signed)
This visit was conducted in person.  BP 124/76 (BP Location: Left Arm, Patient Position: Sitting, Cuff Size: Large)   Pulse 82   Temp 98 F (36.7 C) (Temporal)   Ht 6' 1.5" (1.867 m)   Wt (!) 326 lb 9 oz (148.1 kg)   SpO2 96%   BMI 42.50 kg/m    CC: 6 mo DM f/u visit  Subjective:    Patient ID: Dennis Ayers, male    DOB: January 27, 1951, 69 y.o.   MRN: [id number]  HPI: Dennis Ayers is a 69 y.o. male presenting on 04/06/2019 for Follow-up (Here for 6 mo f/u.)   DM - does regularly check sugars -see mychart for recent readings. Average sugar 127 ranging 120-140. Notes cbg increase at midnight to high 100s. Compliant with antihyperglycemic regimen which includes: metformin 500mg  once daily. Some low sugars - thinks it was a sensor issue. Denies paresthesias. Last diabetic eye exam 06/2018. Pneumovax: 2017. Prevnar: 2017. Glucometer brand: [employer]. DSME: at [facility] 07/2016. Lab Results  Component Value Date   HGBA1C 6.0 (A) 04/06/2019   Diabetic Foot Exam - Simple   Simple Foot Form Diabetic Foot exam was performed with the following findings: Yes 04/06/2019  7:36 AM  Visual Inspection No deformities, no ulcerations, no other skin breakdown bilaterally: Yes Sensation Testing Intact to touch and monofilament testing bilaterally: Yes Pulse Check Posterior Tibialis and Dorsalis pulse intact bilaterally: Yes Comments    Lab Results  Component Value Date   MICROALBUR 1.0 03/27/2018       Relevant past medical, surgical, family and social history reviewed and updated as indicated. Interim medical history since our last visit reviewed. Allergies and medications reviewed and updated. Outpatient Medications Prior to Visit  Medication Sig Dispense Refill  . aspirin EC 81 MG tablet Take 81 mg by mouth daily.    . DiphenhydrAMINE HCl, Sleep, (UNISOM SLEEPMELTS) 25 MG TBDP Take 25 mg by mouth at bedtime.     Marland Kitchen ibuprofen (ADVIL,MOTRIN) 200 MG tablet Take 600 mg by mouth daily  as needed for moderate pain.    Kyandra Mcclaine Docker Oil 300 MG CAPS Take 300 mg by mouth daily.    . Misc Natural Products (OSTEO BI-FLEX JOINT SHIELD PO) Take 1 capsule by mouth daily.     . Multiple Vitamin (MULTIVITAMIN) tablet Take 1 tablet by mouth daily.    Marland Kitchen atorvastatin (LIPITOR) 40 MG tablet Take 1 tablet (40 mg total) by mouth daily at 6 PM. 90 tablet 3  . Continuous Blood Gluc Sensor (FREESTYLE LIBRE 14 DAY SENSOR) MISC 1 Units by Does not apply route every 14 (fourteen) days. 6 each 3  . losartan (COZAAR) 100 MG tablet Take 0.5 tablets (50 mg total) by mouth daily. 45 tablet 3  . metFORMIN (GLUCOPHAGE) 500 MG tablet TAKE 1 TABLET BY MOUTH EVERY DAY WITH BREAKFAST 90 tablet 2  . glucose blood (ONE TOUCH ULTRA TEST) test strip USE AS INSTRUCTED TO CHECK BLOOD SUGAR ONCE A DAY AS NEEDED. DX CODE: E11.9 (Patient not taking: Reported on 04/06/2019) 25 each 3  . ONETOUCH DELICA LANCETS 99991111 MISC Use as directed to check blood sugar once daily as needed.  Dx code: E11.9 (Patient not taking: Reported on 04/06/2019) 100 each 1   No facility-administered medications prior to visit.     Per HPI unless specifically indicated in ROS section below Review of Systems Objective:    BP 124/76 (BP Location: Left Arm, Patient Position: Sitting, Cuff Size: Large)  Pulse 82   Temp 98 F (36.7 C) (Temporal)   Ht 6' 1.5" (1.867 m)   Wt (!) 326 lb 9 oz (148.1 kg)   SpO2 96%   BMI 42.50 kg/m   Wt Readings from Last 3 Encounters:  04/06/19 (!) 326 lb 9 oz (148.1 kg)  10/02/18 (!) 324 lb 9 oz (147.2 kg)  09/25/18 (!) 320 lb (145.2 kg)    Physical Exam Vitals and nursing note reviewed.  Constitutional:      General: He is not in acute distress.    Appearance: Normal appearance. He is well-developed. He is obese. He is not ill-appearing.  Eyes:     General: No scleral icterus.    Extraocular Movements: Extraocular movements intact.     Conjunctiva/sclera: Conjunctivae normal.     Pupils: Pupils are equal,  round, and reactive to light.  Cardiovascular:     Rate and Rhythm: Normal rate and regular rhythm.     Pulses: Normal pulses.     Heart sounds: Normal heart sounds. No murmur.  Pulmonary:     Effort: Pulmonary effort is normal. No respiratory distress.     Breath sounds: Normal breath sounds. No wheezing, rhonchi or rales.  Musculoskeletal:     Cervical back: Normal range of motion and neck supple.     Right lower leg: No edema.     Left lower leg: No edema.     Comments: See HPI for foot exam if done  Lymphadenopathy:     Cervical: No cervical adenopathy.  Skin:    General: Skin is warm and dry.     Findings: No rash.  Neurological:     Mental Status: He is alert.  Psychiatric:        Mood and Affect: Mood normal.       Results for orders placed or performed in visit on 04/06/19  POCT glycosylated hemoglobin (Hb A1C)  Result Value Ref Range   Hemoglobin A1C 6.0 (A) 4.0 - 5.6 %   HbA1c POC (<> result, manual entry)     HbA1c, POC (prediabetic range)     HbA1c, POC (controlled diabetic range)     Assessment & Plan:  This visit occurred during the SARS-CoV-2 public health emergency.  Safety protocols were in place, including screening questions prior to the visit, additional usage of staff PPE, and extensive cleaning of exam room while observing appropriate contact time as indicated for disinfecting solutions.   Problem List Items Addressed This Visit    Obesity, morbid, BMI 40.0-49.9 ([place])    Encouraged renewed efforts at regular exercise routine - winter weather has impeded this but planning to be more active in the garden come spring.      Relevant Medications   metFORMIN (GLUCOPHAGE) 500 MG tablet   Hyperlipidemia associated with type 2 diabetes mellitus (HCC) - goal LDL <70    Chronic, stable on atorvastatin, tolerating without myalgias. The ASCVD Risk score Mikey Bussing DC Jr., et al., 2013) failed to calculate for the following reasons:   The valid total cholesterol range  is 130 to 320 mg/dL       Relevant Medications   atorvastatin (LIPITOR) 40 MG tablet   losartan (COZAAR) 50 MG tablet   metFORMIN (GLUCOPHAGE) 500 MG tablet   Essential hypertension    Chronic, stable. Continue losartan.       Relevant Medications   atorvastatin (LIPITOR) 40 MG tablet   losartan (COZAAR) 50 MG tablet   Controlled diabetes mellitus type 2 with  complications ([place]) - Primary    Doing well on current regimen - continue. Wonderful control on low dose metformin, monitors sugars closely.  Foot exam today.  UTD eye exam.  RTC 6 mo CPE.       Relevant Medications   atorvastatin (LIPITOR) 40 MG tablet   losartan (COZAAR) 50 MG tablet   metFORMIN (GLUCOPHAGE) 500 MG tablet   Other Relevant Orders   POCT glycosylated hemoglobin (Hb A1C) (Completed)       Meds ordered this encounter  Medications  . atorvastatin (LIPITOR) 40 MG tablet    Sig: Take 1 tablet (40 mg total) by mouth daily at 6 PM.    Dispense:  90 tablet    Refill:  3  . Continuous Blood Gluc Sensor (FREESTYLE LIBRE 14 DAY SENSOR) MISC    Sig: 1 Units by Does not apply route every 14 (fourteen) days.    Dispense:  6 each    Refill:  3  . losartan (COZAAR) 50 MG tablet    Sig: Take 1 tablet (50 mg total) by mouth daily.    Dispense:  90 tablet    Refill:  3  . metFORMIN (GLUCOPHAGE) 500 MG tablet    Sig: TAKE 1 TABLET BY MOUTH EVERY DAY WITH BREAKFAST    Dispense:  90 tablet    Refill:  2   Orders Placed This Encounter  Procedures  . POCT glycosylated hemoglobin (Hb A1C)   Patient Instructions  You are doing well today  Continue current medicines.  Return as needed or in 6 months for physical /wellness visit    Follow up plan: Return in about 6 months (around 10/04/2019) for annual exam, prior fasting for blood work, medicare wellness visit.  Ria Bush, MD

## 2019-04-06 NOTE — Assessment & Plan Note (Addendum)
Doing well on current regimen - continue. Wonderful control on low dose metformin, monitors sugars closely.  Foot exam today.  UTD eye exam.  RTC 6 mo CPE.

## 2019-04-06 NOTE — Patient Instructions (Addendum)
You are doing well today  ?Continue current medicines. ?Return as needed or in 6 months for physical/wellness visit.  ?

## 2019-04-25 ENCOUNTER — Ambulatory Visit: Payer: Medicare Other | Attending: Internal Medicine

## 2019-04-25 DIAGNOSIS — Z23 Encounter for immunization: Secondary | ICD-10-CM | POA: Insufficient documentation

## 2019-04-25 NOTE — Progress Notes (Signed)
   Covid-19 Vaccination Clinic  Name:  Dennis Ayers    MRN: [id number] DOB: 10-May-1950  04/25/2019  [name] was observed post Covid-19 immunization for 15 minutes without incident. He was provided with Vaccine Information Sheet and instruction to access the V-Safe system.   [name] was instructed to call 911 with any severe reactions post vaccine: Marland Kitchen Difficulty breathing  . Swelling of face and throat  . A fast heartbeat  . A bad rash all over body  . Dizziness and weakness   Immunizations Administered    Name Date Dose VIS Date Route   Pfizer COVID-19 Vaccine 04/25/2019  9:22 AM 0.3 mL 02/02/2019 Intramuscular   Manufacturer: [place]   Lot: [id number]   [place]: [id number]

## 2019-05-10 ENCOUNTER — Other Ambulatory Visit: Payer: Self-pay | Admitting: Family Medicine

## 2019-06-06 ENCOUNTER — Telehealth: Payer: Self-pay | Admitting: Family Medicine

## 2019-06-06 NOTE — Progress Notes (Signed)
  Chronic Care Management   Note  06/06/2019 Name: Dennis Ayers MRN: [id number] DOB: [birth year]  Dennis Ayers is a 69 y.o. year old male who is a primary care patient of Ria Bush, MD. I reached out to [name] by phone today in response to a referral sent by Dennis Ayers's PCP, Ria Bush, MD.   [name] was given information about Chronic Care Management services today including:  1. CCM service includes personalized support from designated clinical staff supervised by his physician, including individualized plan of care and coordination with other care providers 2. 24/7 contact phone numbers for assistance for urgent and routine care needs. 3. Service will only be billed when office clinical staff spend 20 minutes or more in a month to coordinate care. 4. Only one practitioner may furnish and bill the service in a calendar month. 5. The patient may stop CCM services at any time (effective at the end of the month) by phone call to the office staff.   Patient agreed to services and verbal consent obtained.   Follow up plan:   [name] [employer] Scheduler

## 2019-06-29 ENCOUNTER — Telehealth: Payer: Self-pay

## 2019-06-29 DIAGNOSIS — I10 Essential (primary) hypertension: Secondary | ICD-10-CM

## 2019-06-29 DIAGNOSIS — E118 Type 2 diabetes mellitus with unspecified complications: Secondary | ICD-10-CM

## 2019-06-29 NOTE — Telephone Encounter (Signed)
Per written referral from PCP, requesting referral in Epic for ZAXTON MANALAC to chronic care management pharmacy services for the following conditions:   Essential hypertension, benign  [I10]  Controlled diabetes mellitus type 2 with complications [id number]  Debbora Dus, PharmD Clinical Pharmacist [facility] at [facility] [phone]

## 2019-07-02 NOTE — Chronic Care Management (AMB) (Signed)
Chronic Care Management Pharmacy  Name: Dennis Ayers  MRN: [id number] DOB: 09/09/1950   Chief Complaint/ HPI  Dennis Ayers,  69 y.o., male presents for their Initial CCM visit with the clinical pharmacist via telephone.  PCP : Ria Bush, MD   Their chronic conditions include: hypertension, sleep apnea, coronary artery calcification, type 2 DM, hyperlipidemia, osteoarthritis, obesity, ED  Patient concerns: denies medication concerns  Office Visits:  04/06/19: [name] - DM controlled, foot exam today, rtc 5 months  10/02/18: AWV - cont current meds  Consult Visit: none in past 6 months  Allergies  Allergen Reactions  . Lisinopril Other (See Comments)    cough   Medications: Outpatient Encounter Medications as of 07/03/2019  Medication Sig  . aspirin EC 81 MG tablet Take 81 mg by mouth daily.  Marland Kitchen atorvastatin (LIPITOR) 40 MG tablet Take 1 tablet (40 mg total) by mouth daily at 6 PM.  . Continuous Blood Gluc Sensor (FREESTYLE LIBRE 14 DAY SENSOR) MISC 1 Units by Does not apply route every 14 (fourteen) days.  . DiphenhydrAMINE HCl, Sleep, (UNISOM SLEEPMELTS) 25 MG TBDP Take 25 mg by mouth at bedtime.   Marland Kitchen glucose blood (ONE TOUCH ULTRA TEST) test strip USE AS INSTRUCTED TO CHECK BLOOD SUGAR ONCE A DAY AS NEEDED. DX CODE: E11.9  . Krill Oil 300 MG CAPS Take 300 mg by mouth daily.  Marland Kitchen losartan (COZAAR) 50 MG tablet Take 1 tablet (50 mg total) by mouth daily.  . metFORMIN (GLUCOPHAGE) 500 MG tablet TAKE 1 TABLET BY MOUTH EVERY DAY WITH BREAKFAST  . Misc Natural Products (OSTEO BI-FLEX JOINT SHIELD PO) Take 1 capsule by mouth daily.   . Multiple Vitamin (MULTIVITAMIN) tablet Take 1 tablet by mouth daily.  Marland Kitchen ibuprofen (ADVIL,MOTRIN) 200 MG tablet Take 600 mg by mouth daily as needed for moderate pain.  Glory Rosebush DELICA LANCETS [id number] MISC Use as directed to check blood sugar once daily as needed.  Dx code: E11.9 (Patient not taking: Reported on 07/03/2019)   No  facility-administered encounter medications on file as of 07/03/2019.   Current Diagnosis/Assessment:   Emergency planning/management officer Strain: Low Risk   . Difficulty of Paying Living Expenses: Not hard at all   Goals Addressed            This Visit's Progress   . Pharmacy Care Plan       CARE PLAN ENTRY  Current Barriers:  . Chronic Disease Management support, education, and care coordination needs related to Hypertension and Diabetes   Hypertension . Pharmacist Clinical Goal(s): o Over the next 6 months, patient will work with PharmD and providers to maintain BP goal <130/80 mmHg . Current regimen:  o Losartan 50 mg - 1 tablet daily . Interventions: o Recommend evaluation of sleep apnea/CPAP . Patient self care activities - Over the next 6 months, patient will: o Check blood pressure monthly, document, and provide at future appointments o Ensure daily salt intake < 2300 mg/day o Discuss sleep apnea at next visit with Dr. [name] o Continue exercising 30 minutes, 5 days per week   Diabetes . Pharmacist Clinical Goal(s): o Over the next 6 months, patient will work with PharmD and providers to maintain A1c goal <7% . Current regimen:  o Metformin 500 mg - 1 tablet daily with breakfast . Interventions: o Continue current medications . Patient self care activities - Over the next 6 months, patient will: o Continue to monitor blood glucose with freestyle libre o Adhere to  heart healthy diet low in carbohydrates such as the diabetic plate method  o Contact provider with any episodes of hypoglycemia (blood glucose < 70 mg/dL)  Additional recommendations: - Appropriate OTC use: Limit use of Unisom for sleep due to increased risk of side effects in adults > 65 including dizziness, dry mouth, cognitive impairment, and constipation. Try reducing use to 1/2 tablet daily for 2 weeks, then every other day for 2 weeks, then use as needed.   Initial goal documentation      Hypertension    CMP Latest Ref Rng & Units 09/28/2018 03/16/2018 12/08/2017  Glucose 70 - 99 mg/dL 143(H) - -  BUN 6 - 23 mg/dL 21 - -  Creatinine 0.40 - 1.50 mg/dL 0.93 - -  Sodium 135 - 145 mEq/L 140 - -  Potassium 3.5 - 5.1 mEq/L 4.3 - -  Chloride 96 - 112 mEq/L 104 - -  CO2 19 - 32 mEq/L 27 - -  Calcium 8.4 - 10.5 mg/dL 9.4 - -  Total Protein 6.0 - 8.3 g/dL 6.9 - -  Total Bilirubin 0.2 - 1.2 mg/dL 0.8 - -  Alkaline Phos 39 - 117 U/L 67 - -  AST 0 - 37 U/L 17 - -  ALT 0 - 53 U/L 28 24 23    Office blood pressures are: BP Readings from Last 3 Encounters:  04/06/19 124/76  10/02/18 130/68  09/15/18 128/78   BP goal < 130/80 mmHg Patient has failed these meds in the past: none Patient checks BP at home infrequently (monthly) Patient home BP readings are ranging: none reported  Sleep apnea: does not have CPAP Patient is currently controlled on the following medications:   Losartan 50 mg - 1 tablet daily  Exercise: walking 2-4 miles per day   Plan: Continue current medications; Refer to PCP for sleep apnea/CPAP evaluation.  Hyperlipidemia   Lipid Panel     Component Value Date/Time   CHOL 100 09/28/2018 0740   TRIG 40.0 09/28/2018 0740   HDL 51.30 09/28/2018 0740   CHOLHDL 2 09/28/2018 0740   VLDL 8.0 09/28/2018 0740   LDLCALC 40 09/28/2018 0740    CBC Latest Ref Rng & Units 09/28/2018 09/09/2017 09/03/2016  WBC 4.0 - 10.5 K/uL 5.3 4.9 5.0  Hemoglobin 13.0 - 17.0 g/dL 16.4 16.6 16.8  Hematocrit 39.0 - 52.0 % 47.9 47.9 47.6  Platelets 150.0 - 400.0 K/uL 213.0 226.0 254.0   LDL goal < 70 Patient has failed these meds in past: none reported Patient is currently controlled on the following medications:   Atorvastatin 40 mg - 1 tablet daily  Krill oil 300 mg - 1 cap daily   Aspirin 81 mg - 1 tablet daily  Plan: Continue current medications  Diabetes   Recent Relevant Labs: Lab Results  Component Value Date/Time   HGBA1C 6.0 (A) 04/06/2019 07:24 AM   HGBA1C 5.9 09/28/2018  07:40 AM   HGBA1C 5.3 03/27/2018 08:07 AM   HGBA1C 5.3 09/09/2017 09:31 AM   MICROALBUR 1.0 03/27/2018 08:54 AM   MICROALBUR 0.8 03/04/2017 08:00 AM    Checking BG: Freestyle Libre (Discount through insurance) Hypoglycemia - denies; average BG last 24 hours --> 128  A1c goal < 7% Patient has failed these meds in past: none  Patient is currently controlled on the following medications:   Metformin 500 mg - 1 tablet daily with breakfast  Last diabetic eye exam:  Lab Results  Component Value Date/Time   HMDIABEYEEXA No Retinopathy 07/04/2018 12:00 AM  Last diabetic foot exam:  04/06/19 (PCP)  Exercise: walking Diet: monitoring with freestyle Elenor Legato helps with food choices   Plan: Continue current medications  Osteoarthritis   Patient has failed these meds in past: none  Patient is currently controlled on the following medications:   Advil 200 mg - 3 tabs daily PRN  Tylenol 500 mg - 1 tablet q4-6hr PRN  We discussed: usually takes Tylenol 1-2 times per month, no longer taking Advil  Plan: Continue current medications  Vaccines   Reviewed and discussed patient's vaccination history. Vaccines up to date.  Immunization History  Administered Date(s) Administered  . Hepatitis B, adult 03/11/2017, 06/14/2017, 10/18/2017  . Influenza Whole 11/23/2011  . Influenza, High Dose Seasonal PF 11/03/2014, 11/21/2015, 10/01/2016, 10/20/2018  . Influenza-Unspecified 09/30/2017  . PFIZER SARS-COV-2 Vaccination 04/02/2019, 04/25/2019  . Pneumococcal Conjugate-13 07/10/2015  . Pneumococcal Polysaccharide-23 11/21/2015  . Td 03/23/2002  . Tdap 07/10/2015  . Zoster 07/23/2011  . Zoster Recombinat (Shingrix) 11/18/2017, 02/22/2018   Plan: Ensure vaccines remain up to date  Medication Management  OTCs: multivitamin, osteo bi-flex joint shield OTC, (Unisom) diphenhydramine 25 mg - 1 qhs PRN sleep  Pharmacy/Benefits: [facility] + Mail order  Affordability: no concerns  CCM Follow  Up: 01/03/20 at 9:00 AM - 6 months (telephone)  Debbora Dus, PharmD Clinical Pharmacist [facility] at [facility] [phone]

## 2019-07-03 ENCOUNTER — Ambulatory Visit: Payer: Medicare Other

## 2019-07-03 ENCOUNTER — Other Ambulatory Visit: Payer: Self-pay

## 2019-07-03 DIAGNOSIS — E1169 Type 2 diabetes mellitus with other specified complication: Secondary | ICD-10-CM

## 2019-07-03 DIAGNOSIS — I10 Essential (primary) hypertension: Secondary | ICD-10-CM

## 2019-07-03 DIAGNOSIS — E118 Type 2 diabetes mellitus with unspecified complications: Secondary | ICD-10-CM

## 2019-07-03 NOTE — Patient Instructions (Signed)
Jul 03, 2019  Dear [name],  It was a pleasure meeting you during our initial appointment on Jul 03, 2019. Below is a summary of the goals we discussed and components of chronic care management. Please contact me anytime with questions or concerns.   Visit Information  Goals Addressed            This Visit's Progress   . Pharmacy Care Plan       CARE PLAN ENTRY  Current Barriers:  . Chronic Disease Management support, education, and care coordination needs related to Hypertension and Diabetes   Hypertension . Pharmacist Clinical Goal(s): o Over the next 6 months, patient will work with PharmD and providers to maintain BP goal <130/80 mmHg . Current regimen:  o Losartan 50 mg - 1 tablet daily . Interventions: o Recommend evaluation of sleep apnea/CPAP . Patient self care activities - Over the next 6 months, patient will: o Check blood pressure monthly, document, and provide at future appointments o Ensure daily salt intake < 2300 mg/day o Discuss sleep apnea at next visit with Dr. [name] o Continue exercising 30 minutes, 5 days per week   Diabetes . Pharmacist Clinical Goal(s): o Over the next 6 months, patient will work with PharmD and providers to maintain A1c goal <7% . Current regimen:  o Metformin 500 mg - 1 tablet daily with breakfast . Interventions: o Continue current medications . Patient self care activities - Over the next 6 months, patient will: o Continue to monitor blood glucose with freestyle libre o Adhere to heart healthy diet low in carbohydrates such as the diabetic plate method  o Contact provider with any episodes of hypoglycemia (blood glucose < 70 mg/dL)  Additional recommendations: - Appropriate OTC use: Limit use of Unisom for sleep due to increased risk of side effects in adults > 65 including dizziness, dry mouth, cognitive impairment, and constipation. Try reducing use to 1/2 tablet daily for 2 weeks, then every other day for 2  weeks, then use as needed.   Initial goal documentation      Mr. Degiorgio was given information about Chronic Care Management services today including:  1. CCM service includes personalized support from designated clinical staff supervised by his physician, including individualized plan of care and coordination with other care providers 2. 24/7 contact phone numbers for assistance for urgent and routine care needs. 3. Standard insurance, coinsurance, copays and deductibles apply for chronic care management only during months in which we provide at least 20 minutes of these services. Most insurances cover these services at 100%, however patients may be responsible for any copay, coinsurance and/or deductible if applicable. This service may help you avoid the need for more expensive face-to-face services. 4. Only one practitioner may furnish and bill the service in a calendar month. 5. The patient may stop CCM services at any time (effective at the end of the month) by phone call to the office staff.  Patient agreed to services and verbal consent obtained.   The patient verbalized understanding of instructions provided today and agreed to receive a mailed copy of patient instruction and/or educational materials. Telephone follow up appointment with pharmacy team member scheduled for: 01/03/20 at 9:00 AM - 6 months (telephone)  Debbora Dus, PharmD Clinical Pharmacist [facility] at [facility] [phone]   Diabetes Mellitus and Nutrition, Adult When you have diabetes (diabetes mellitus), it is very important to have healthy eating habits because your blood sugar (glucose) levels are greatly affected  by what you eat and drink. Eating healthy foods in the appropriate amounts, at about the same times every day, can help you:  Control your blood glucose.  Lower your risk of heart disease.  Improve your blood pressure.  Reach or maintain a healthy weight. Every person with  diabetes is different, and each person has different needs for a meal plan. Your health care provider may recommend that you work with a diet and nutrition specialist (dietitian) to make a meal plan that is best for you. Your meal plan may vary depending on factors such as:  The calories you need.  The medicines you take.  Your weight.  Your blood glucose, blood pressure, and cholesterol levels.  Your activity level.  Other health conditions you have, such as heart or kidney disease. How do carbohydrates affect me? Carbohydrates, also called carbs, affect your blood glucose level more than any other type of food. Eating carbs naturally raises the amount of glucose in your blood. Carb counting is a method for keeping track of how many carbs you eat. Counting carbs is important to keep your blood glucose at a healthy level, especially if you use insulin or take certain oral diabetes medicines. It is important to know how many carbs you can safely have in each meal. This is different for every person. Your dietitian can help you calculate how many carbs you should have at each meal and for each snack. Foods that contain carbs include:  Bread, cereal, rice, pasta, and crackers.  Potatoes and corn.  Peas, beans, and lentils.  Milk and yogurt.  Fruit and juice.  Desserts, such as cakes, cookies, ice cream, and candy. How does alcohol affect me? Alcohol can cause a sudden decrease in blood glucose (hypoglycemia), especially if you use insulin or take certain oral diabetes medicines. Hypoglycemia can be a life-threatening condition. Symptoms of hypoglycemia (sleepiness, dizziness, and confusion) are similar to symptoms of having too much alcohol. If your health care provider says that alcohol is safe for you, follow these guidelines:  Limit alcohol intake to no more than 1 drink per day for nonpregnant women and 2 drinks per day for men. One drink equals 12 oz of beer, 5 oz of wine, or 1 oz  of hard liquor.  Do not drink on an empty stomach.  Keep yourself hydrated with water, diet soda, or unsweetened iced tea.  Keep in mind that regular soda, juice, and other mixers may contain a lot of sugar and must be counted as carbs. What are tips for following this plan?  Reading food labels  Start by checking the serving size on the "Nutrition Facts" label of packaged foods and drinks. The amount of calories, carbs, fats, and other nutrients listed on the label is based on one serving of the item. Many items contain more than one serving per package.  Check the total grams (g) of carbs in one serving. You can calculate the number of servings of carbs in one serving by dividing the total carbs by 15. For example, if a food has 30 g of total carbs, it would be equal to 2 servings of carbs.  Check the number of grams (g) of saturated and trans fats in one serving. Choose foods that have low or no amount of these fats.  Check the number of milligrams (mg) of salt (sodium) in one serving. Most people should limit total sodium intake to less than 2,300 mg per day.  Always check the nutrition information  of foods labeled as "low-fat" or "nonfat". These foods may be higher in added sugar or refined carbs and should be avoided.  Talk to your dietitian to identify your daily goals for nutrients listed on the label. Shopping  Avoid buying canned, premade, or processed foods. These foods tend to be high in fat, sodium, and added sugar.  Shop around the outside edge of the grocery store. This includes fresh fruits and vegetables, bulk grains, fresh meats, and fresh dairy. Cooking  Use low-heat cooking methods, such as baking, instead of high-heat cooking methods like deep frying.  Cook using healthy oils, such as olive, canola, or sunflower oil.  Avoid cooking with butter, cream, or high-fat meats. Meal planning  Eat meals and snacks regularly, preferably at the same times every day.  Avoid going long periods of time without eating.  Eat foods high in fiber, such as fresh fruits, vegetables, beans, and whole grains. Talk to your dietitian about how many servings of carbs you can eat at each meal.  Eat 4-6 ounces (oz) of lean protein each day, such as lean meat, chicken, fish, eggs, or tofu. One oz of lean protein is equal to: ? 1 oz of meat, chicken, or fish. ? 1 egg. ?  cup of tofu.  Eat some foods each day that contain healthy fats, such as avocado, nuts, seeds, and fish. Lifestyle  Check your blood glucose regularly.  Exercise regularly as told by your health care provider. This may include: ? 150 minutes of moderate-intensity or vigorous-intensity exercise each week. This could be brisk walking, biking, or water aerobics. ? Stretching and doing strength exercises, such as yoga or weightlifting, at least 2 times a week.  Take medicines as told by your health care provider.  Do not use any products that contain nicotine or tobacco, such as cigarettes and e-cigarettes. If you need help quitting, ask your health care provider.  Work with a Social worker or diabetes educator to identify strategies to manage stress and any emotional and social challenges. Questions to ask a health care provider  Do I need to meet with a diabetes educator?  Do I need to meet with a dietitian?  What number can I call if I have questions?  When are the best times to check my blood glucose? Where to find more information:  American Diabetes Association: diabetes.org  Academy of Nutrition and Dietetics: www.eatright.[employer] of Diabetes and Digestive and Kidney Diseases (NIH): [employer] Summary  A healthy meal plan will help you control your blood glucose and maintain a healthy lifestyle.  Working with a diet and nutrition specialist (dietitian) can help you make a meal plan that is best for you.  Keep in mind that carbohydrates (carbs) and alcohol have  immediate effects on your blood glucose levels. It is important to count carbs and to use alcohol carefully. This information is not intended to replace advice given to you by your health care provider. Make sure you discuss any questions you have with your health care provider. Document Revised: 01/21/2017 Document Reviewed: 03/15/2016 Elsevier Patient Education  2020 [employer].

## 2019-07-03 NOTE — Progress Notes (Signed)
I have collaborated with the care management provider regarding care management and care coordination activities outlined in this encounter and have reviewed this encounter including documentation in the note and care plan. I am certifying that I agree with the content of this note and encounter as supervising physician.  

## 2019-07-13 ENCOUNTER — Encounter: Payer: Self-pay | Admitting: Family Medicine

## 2019-07-13 DIAGNOSIS — E119 Type 2 diabetes mellitus without complications: Secondary | ICD-10-CM | POA: Diagnosis not present

## 2019-07-13 LAB — HM DIABETES EYE EXAM

## 2019-10-01 ENCOUNTER — Encounter: Payer: Self-pay | Admitting: Family Medicine

## 2019-10-02 ENCOUNTER — Other Ambulatory Visit: Payer: Self-pay | Admitting: Family Medicine

## 2019-10-02 ENCOUNTER — Ambulatory Visit (INDEPENDENT_AMBULATORY_CARE_PROVIDER_SITE_OTHER): Payer: Medicare Other

## 2019-10-02 ENCOUNTER — Other Ambulatory Visit (INDEPENDENT_AMBULATORY_CARE_PROVIDER_SITE_OTHER): Payer: Medicare Other

## 2019-10-02 ENCOUNTER — Other Ambulatory Visit: Payer: Self-pay

## 2019-10-02 DIAGNOSIS — E785 Hyperlipidemia, unspecified: Secondary | ICD-10-CM

## 2019-10-02 DIAGNOSIS — Z125 Encounter for screening for malignant neoplasm of prostate: Secondary | ICD-10-CM

## 2019-10-02 DIAGNOSIS — D751 Secondary polycythemia: Secondary | ICD-10-CM

## 2019-10-02 DIAGNOSIS — Z0000 Encounter for general adult medical examination without abnormal findings: Secondary | ICD-10-CM

## 2019-10-02 DIAGNOSIS — E1169 Type 2 diabetes mellitus with other specified complication: Secondary | ICD-10-CM

## 2019-10-02 DIAGNOSIS — E118 Type 2 diabetes mellitus with unspecified complications: Secondary | ICD-10-CM | POA: Diagnosis not present

## 2019-10-02 LAB — CBC WITH DIFFERENTIAL/PLATELET
Basophils Absolute: 0 10*3/uL (ref 0.0–0.1)
Basophils Relative: 0.7 % (ref 0.0–3.0)
Eosinophils Absolute: 0.2 10*3/uL (ref 0.0–0.7)
Eosinophils Relative: 3.5 % (ref 0.0–5.0)
HCT: 45.2 % (ref 39.0–52.0)
Hemoglobin: 15.7 g/dL (ref 13.0–17.0)
Lymphocytes Relative: 32.7 % (ref 12.0–46.0)
Lymphs Abs: 1.7 10*3/uL (ref 0.7–4.0)
MCHC: 34.8 g/dL (ref 30.0–36.0)
MCV: 94.5 fl (ref 78.0–100.0)
Monocytes Absolute: 0.5 10*3/uL (ref 0.1–1.0)
Monocytes Relative: 8.9 % (ref 3.0–12.0)
Neutro Abs: 2.8 10*3/uL (ref 1.4–7.7)
Neutrophils Relative %: 54.2 % (ref 43.0–77.0)
Platelets: 201 10*3/uL (ref 150.0–400.0)
RBC: 4.78 Mil/uL (ref 4.22–5.81)
RDW: 13.2 % (ref 11.5–15.5)
WBC: 5.1 10*3/uL (ref 4.0–10.5)

## 2019-10-02 LAB — LIPID PANEL
Cholesterol: 99 mg/dL (ref 0–200)
HDL: 53.5 mg/dL (ref >39.00)
LDL Cholesterol: 37 mg/dL (ref 0–99)
NonHDL: 45.99
Total CHOL/HDL Ratio: 2
Triglycerides: 43 mg/dL (ref 0.0–149.0)
VLDL: 8.6 mg/dL (ref 0.0–40.0)

## 2019-10-02 LAB — COMPREHENSIVE METABOLIC PANEL
ALT: 25 U/L (ref 0–53)
AST: 15 U/L (ref 0–37)
Albumin: 4.2 g/dL (ref 3.5–5.2)
Alkaline Phosphatase: 55 U/L (ref 39–117)
BUN: 23 mg/dL (ref 6–23)
CO2: 30 mEq/L (ref 19–32)
Calcium: 9.1 mg/dL (ref 8.4–10.5)
Chloride: 103 mEq/L (ref 96–112)
Creatinine, Ser: 1.03 mg/dL (ref 0.40–1.50)
GFR: 71.41 mL/min (ref >60.00)
Glucose, Bld: 157 mg/dL — ABNORMAL HIGH (ref 70–99)
Potassium: 4.7 mEq/L (ref 3.5–5.1)
Sodium: 141 mEq/L (ref 135–145)
Total Bilirubin: 0.8 mg/dL (ref 0.2–1.2)
Total Protein: 6.6 g/dL (ref 6.0–8.3)

## 2019-10-02 LAB — PSA: PSA: 1.6 ng/mL (ref 0.10–4.00)

## 2019-10-02 LAB — HEMOGLOBIN A1C: Hgb A1c MFr Bld: 6.4 % (ref 4.6–6.5)

## 2019-10-02 NOTE — Progress Notes (Signed)
PCP notes:  Health Maintenance: Flu- due   Abnormal Screenings: none   Patient concerns: Discuss Freestyle Libre being covered under Medicare   Nurse concerns: none   Next PCP appt.: 10/05/2019 @ 8:30 am

## 2019-10-02 NOTE — Progress Notes (Signed)
Subjective:   Dennis Ayers is a 69 y.o. male who presents for Medicare Annual/Subsequent preventive examination.  Review of Systems: N/A      I connected with the patient today by telephone and verified that I am speaking with the correct person using two identifiers. Location patient: home Location nurse: work Persons participating in the virtual visit: patient, Marine scientist.   I discussed the limitations, risks, security and privacy concerns of performing an evaluation and management service by telephone and the availability of in person appointments. I also discussed with the patient that there may be a patient responsible charge related to this service. The patient expressed understanding and verbally consented to this telephonic visit.    Interactive audio and video telecommunications were attempted between this nurse and patient, however failed, due to patient having technical difficulties OR patient did not have access to video capability.  We continued and completed visit with audio only.     Cardiac Risk Factors include: advanced age (>60men, >46 women);diabetes mellitus;hypertension;dyslipidemia;male gender     Objective:    Today's Vitals   There is no height or weight on file to calculate BMI.  Advanced Directives 10/02/2019 09/25/2018 09/09/2017 10/29/2016 10/21/2016 10/21/2016 10/15/2016  Does Patient Have a Medical Advance Directive? Yes Yes Yes Yes Yes Yes Yes  Type of Paramedic of [place];Living will [place];Living will [place];Living will [place];Living will Living will;Healthcare Power of [place];Living will Living will;Healthcare Power of Attorney  Does patient want to make changes to medical advance directive? - - - - - - -  Copy of [place] in Chart? Yes - validated most recent copy scanned in chart (See row information) Yes - validated  most recent copy scanned in chart (See row information) Yes No - copy requested No - copy requested No - copy requested -  Pre-existing out of facility DNR order (yellow form or pink MOST form) - - - - - - -    Current Medications (verified) Outpatient Encounter Medications as of 10/02/2019  Medication Sig  . aspirin EC 81 MG tablet Take 81 mg by mouth daily.  . Continuous Blood Gluc Sensor (FREESTYLE LIBRE 14 DAY SENSOR) MISC 1 Units by Does not apply route every 14 (fourteen) days.  . DiphenhydrAMINE HCl, Sleep, (UNISOM SLEEPMELTS) 25 MG TBDP Take 25 mg by mouth at bedtime.   Marland Kitchen glucose blood (ONE TOUCH ULTRA TEST) test strip USE AS INSTRUCTED TO CHECK BLOOD SUGAR ONCE A DAY AS NEEDED. DX CODE: E11.9  . ibuprofen (ADVIL,MOTRIN) 200 MG tablet Take 600 mg by mouth daily as needed for moderate pain.  Javier Docker Oil 300 MG CAPS Take 300 mg by mouth daily.  Marland Kitchen losartan (COZAAR) 50 MG tablet Take 1 tablet (50 mg total) by mouth daily.  . metFORMIN (GLUCOPHAGE) 500 MG tablet TAKE 1 TABLET BY MOUTH EVERY DAY WITH BREAKFAST  . Misc Natural Products (OSTEO BI-FLEX JOINT SHIELD PO) Take 1 capsule by mouth daily.   . Multiple Vitamin (MULTIVITAMIN) tablet Take 1 tablet by mouth daily.  Glory Rosebush DELICA LANCETS 50V MISC Use as directed to check blood sugar once daily as needed.  Dx code: E11.9  . atorvastatin (LIPITOR) 40 MG tablet Take 1 tablet (40 mg total) by mouth daily at 6 PM.   No facility-administered encounter medications on file as of 10/02/2019.    Allergies (verified) Lisinopril   History: Past Medical History:  Diagnosis Date  . Benign neoplasm of cecum - 25 mm polyp 08/06/2015  . Coronary artery calcification   . Diabetes mellitus without complication ([place])   . DJD (degenerative joint disease) of knee    [place] steroid injection (01/2014)  . Family history of ovarian cancer   . Hyperglycemia   . Hypertension   . Jaundice    with icterus, when very young, treated and resolved  .  Knee pain   . Morbid obesity ([place])   . OSA (obstructive sleep apnea)    mild to mod, working on weight loss, no cpap needed per test  . Osteoarthritis of knee 07/23/2011   blat s/p L TKR ([place])  . Redundant colon 11/16/2017  . Severe obesity (BMI >= 40) ([place]) 02/15/2008  . Sleep apnea    does not have a cpap  . Viral hepatitis 8th grade   has had jaundice in past, unsure of cause ?Hep A   Past Surgical History:  Procedure Laterality Date  . CHOLECYSTECTOMY  1998  . COLONOSCOPY  08/20/2011   4 adenomatous polyps - rpt 3 yrs Gatha Mayer)  . COLONOSCOPY  07-22-15   mult TAs, severe diverticulosis, one large 2.5cm cecal polyp pending resection Carlean Purl)  . COLONOSCOPY N/A 09/10/2015   large TA, rpt 1 yr Gatha Mayer, MD)  . COLONOSCOPY  11/2017   7 TAs, 3 SSPs, diverticulosis, rpt 2 yrs Carlean Purl)  . COLONOSCOPY WITH PROPOFOL N/A 10/29/2016   TA, rpt 1 yr Carlean Purl, Ofilia Neas, MD)  . EYE SURGERY Right    laser  . HOT HEMOSTASIS N/A 10/29/2016   Procedure: HOT HEMOSTASIS (ARGON PLASMA COAGULATION/BICAP);  Surgeon: Gatha Mayer, MD;  Location: [facility];  Service: Endoscopy;  Laterality: N/A;  . TONSILLECTOMY    . TOTAL KNEE ARTHROPLASTY Left 07/25/2012   Surgeon: Johnny Bridge, MD  . 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 Alphonzo Severance   Family History  Problem Relation Age of Onset  . Ovarian cancer Mother   . Ulcers Mother        stomach  . Coronary artery disease Father 68       CABG X4  . Hypertension Father   . Stroke Father 55  . Heart attack Father 66  . Heart attack Sister 6  . Heart Problems Paternal Aunt   . Heart attack Paternal Uncle   . Heart attack Paternal Aunt   . Heart attack Paternal Uncle   . Diabetes Neg Hx   . Prostate cancer Neg Hx   . Breast cancer Neg Hx   . Colon cancer Neg Hx   . Depression Neg Hx   . Alcohol abuse Neg Hx   . Colon polyps Neg Hx    Social History   Socioeconomic History  . Marital status: Married    Spouse name: Not on file   . Number of children: 1  . Years of education: Not on file  . Highest education level: Not on file  Occupational History  . Occupation: Self-employed  Tobacco Use  . Smoking status: Never Smoker  . Smokeless tobacco: Never Used  Vaping Use  . Vaping Use: Never used  Substance and Sexual Activity  . Alcohol use: No  . Drug use: No  . Sexual activity: Not Currently  Other Topics Concern  . Not on file  Social History Narrative   Caffeine: 2-3 diet sodas/day   Lives with wife   1 grown son.   Occupation: Film/video editor; Ecologist  Adult nurse systems   Activity: some golf, limited by knee pain   Diet: good water, daily vegetables   Social Determinants of Health   Financial Resource Strain: Low Risk   . Difficulty of Paying Living Expenses: Not hard at all  Food Insecurity: No Food Insecurity  . Worried About Charity fundraiser in the Last Year: Never true  . Ran Out of Food in the Last Year: Never true  Transportation Needs: No Transportation Needs  . Lack of Transportation (Medical): No  . Lack of Transportation (Non-Medical): No  Physical Activity: Inactive  . Days of Exercise per Week: 0 days  . Minutes of Exercise per Session: 0 min  Stress: No Stress Concern Present  . Feeling of Stress : Not at all  Social Connections:   . Frequency of Communication with Friends and Family:   . Frequency of Social Gatherings with Friends and Family:   . Attends Religious Services:   . Active Member of Clubs or Organizations:   . Attends Archivist Meetings:   Marland Kitchen Marital Status:     Tobacco Counseling Counseling given: Not Answered   Clinical Intake:  Pre-visit preparation completed: Yes  Pain : No/denies pain     Nutritional Risks: None Diabetes: Yes CBG done?: No Did pt. bring in CBG monitor from home?: No  How often do you need to have someone help you when you read instructions, pamphlets, or other written materials from your doctor or  pharmacy?: 1 - Never What is the last grade level you completed in school?: associates  Diabetic: Yes Nutrition Risk Assessment:  Has the patient had any N/V/D within the last 2 months?  No  Does the patient have any non-healing wounds?  No  Has the patient had any unintentional weight loss or weight gain?  No   Diabetes:  Is the patient diabetic?  Yes  If diabetic, was a CBG obtained today?  No  Did the patient bring in their glucometer from home?  No  How often do you monitor your CBG's? Libre continuously monitoring.   Financial Strains and Diabetes Management:  Are you having any financial strains with the device, your supplies or your medication? No .  Does the patient want to be seen by Chronic Care Management for management of their diabetes?  No  Would the patient like to be referred to a Nutritionist or for Diabetic Management?  No   Diabetic Exams:  Diabetic Eye Exam: Completed 07/13/2019 Diabetic Foot Exam: Completed 04/06/2019   Interpreter Needed?: No  Information entered by :: CJohnson, LPN   Activities of Daily Living In your present state of health, do you have any difficulty performing the following activities: 10/02/2019  Hearing? N  Vision? N  Difficulty concentrating or making decisions? N  Walking or climbing stairs? N  Dressing or bathing? N  Doing errands, shopping? N  Preparing Food and eating ? N  Using the Toilet? N  In the past six months, have you accidently leaked urine? N  Do you have problems with loss of bowel control? N  Managing your Medications? N  Managing your Finances? N  Housekeeping or managing your Housekeeping? N  Some recent data might be hidden    Patient Care Team: Ria Bush, MD as PCP - General (Family Medicine) Marchia Bond, MD as Consulting Physician (Orthopedic Surgery) End, Harrell Gave, MD as Consulting Physician (Cardiology) Gatha Mayer, MD as Consulting Physician (Gastroenterology) Macarthur Critchley, [place]  as Consulting Physician (Optometry)  Debbora Dus, [facility] as Pharmacist (Pharmacist)  Indicate any recent Medical Services you may have received from other than [facility] providers in the past year (date may be approximate).     Assessment:   This is a routine wellness examination for [place].  Hearing/Vision screen  Hearing Screening   125Hz  250Hz  500Hz  1000Hz  2000Hz  3000Hz  4000Hz  6000Hz  8000Hz   Right ear:           Left ear:           Vision Screening Comments: Patient gets annual eye exams   Dietary issues and exercise activities discussed: Current Exercise Habits: The patient does not participate in regular exercise at present, Exercise limited by: None identified  Goals    .  Increase physical activity (pt-stated)      Starting 09/03/2016, I will continue to stay active working in the yard and try to increase walking as tolerated. I will also continue following a low carbohydrate diet in an effort to lose weight and control my blood sugar.    .  Patient Stated      Starting 09/09/2017, I will continue to take medications as prescribed.     .  Patient Stated      09/23/2019, I will maintain and continue medications as prescribed.     .  Pharmacy Care Plan      CARE PLAN ENTRY  Current Barriers:  . Chronic Disease Management support, education, and care coordination needs related to Hypertension and Diabetes   Hypertension . Pharmacist Clinical Goal(s): o Over the next 6 months, patient will work with PharmD and providers to maintain BP goal <130/80 mmHg . Current regimen:  o Losartan 50 mg - 1 tablet daily . Interventions: o Recommend evaluation of sleep apnea/CPAP . Patient self care activities - Over the next 6 months, patient will: o Check blood pressure monthly, document, and provide at future appointments o Ensure daily salt intake < 2300 mg/day o Discuss sleep apnea at next visit with Dr. [name] o Continue exercising 30 minutes, 5 days per week    Diabetes . Pharmacist Clinical Goal(s): o Over the next 6 months, patient will work with PharmD and providers to maintain A1c goal <7% . Current regimen:  o Metformin 500 mg - 1 tablet daily with breakfast . Interventions: o Continue current medications . Patient self care activities - Over the next 6 months, patient will: o Continue to monitor blood glucose with freestyle libre o Adhere to heart healthy diet low in carbohydrates such as the diabetic plate method  o Contact provider with any episodes of hypoglycemia (blood glucose < 70 mg/dL)  Additional recommendations: - Appropriate OTC use: Limit use of Unisom for sleep due to increased risk of side effects in adults > 65 including dizziness, dry mouth, cognitive impairment, and constipation. Try reducing use to 1/2 tablet daily for 2 weeks, then every other day for 2 weeks, then use as needed.   Initial goal documentation      Depression Screen PHQ 2/9 Scores 10/02/2019 09/25/2018 09/09/2017 09/03/2016 07/10/2015  PHQ - 2 Score 0 0 0 0 0  PHQ- 9 Score 0 1 0 - -    Fall Risk Fall Risk  10/02/2019 09/25/2018 09/09/2017 09/03/2016 07/10/2015  Falls in the past year? 0 1 No No No  Number falls in past yr: 0 - - - -  Injury with Fall? 0 0 - - -  Comment - lost balance - - -  Risk for fall due to :  Medication side effect History of fall(s);Medication side effect - - -  Follow up Falls evaluation completed;Falls prevention discussed Falls evaluation completed;Falls prevention discussed - - -    Any stairs in or around the home? Yes  If so, are there any without handrails? No  Home free of loose throw rugs in walkways, pet beds, electrical cords, etc? Yes  Adequate lighting in your home to reduce risk of falls? Yes   ASSISTIVE DEVICES UTILIZED TO PREVENT FALLS:  Life alert? No  Use of a cane, walker or w/c? No  Grab bars in the bathroom? No  Shower chair or bench in shower? No  Elevated toilet seat or a handicapped toilet? No    TIMED UP AND GO:  Was the test performed? N/A, telephonic visit .   Cognitive Function: MMSE - Mini Mental State Exam 10/02/2019 09/25/2018 09/09/2017 09/03/2016  Orientation to time 5 5 5 5   Orientation to Place 5 5 5 5   Registration 3 3 3 3   Attention/ Calculation 5 5 0 0  Recall 3 3 3 3   Language- name 2 objects - 0 0 0  Language- repeat 1 1 1 1   Language- follow 3 step command - 0 3 3  Language- read & follow direction - 0 0 0  Write a sentence - 0 0 0  Copy design - 0 0 0  Total score - 22 20 20   Mini Cog  Mini-Cog screen was completed. Maximum score is 22. A value of 0 denotes this part of the MMSE was not completed or the patient failed this part of the Mini-Cog screening.       Immunizations Immunization History  Administered Date(s) Administered  . Hepatitis B, adult 03/11/2017, 06/14/2017, 10/18/2017  . Influenza Whole 11/23/2011  . Influenza, High Dose Seasonal PF 11/03/2014, 11/21/2015, 10/01/2016, 10/20/2018  . Influenza-Unspecified 09/30/2017  . PFIZER SARS-COV-2 Vaccination 04/02/2019, 04/25/2019  . Pneumococcal Conjugate-13 07/10/2015  . Pneumococcal Polysaccharide-23 11/21/2015  . Td 03/23/2002  . Tdap 07/10/2015  . Zoster 07/23/2011  . Zoster Recombinat (Shingrix) 11/18/2017, 02/22/2018     TDAP status: Up to date {Flu Vaccine status: due, will be available in the office at the end of August Pneumococcal vaccine status: Up to date Covid-19 vaccine status: Completed vaccines  Qualifies for Shingles Vaccine? Yes   Zostavax completed Yes   Shingrix Completed?: Yes  Screening Tests Health Maintenance  Topic Date Due  . INFLUENZA VACCINE  09/23/2019  . DTAP VACCINES (1) 04/05/2022 (Originally 01/01/1951)  . HEMOGLOBIN A1C  10/04/2019  . COLONOSCOPY  11/17/2019  . FOOT EXAM  04/05/2020  . OPHTHALMOLOGY EXAM  07/12/2020  . DTaP/Tdap/Td (3 - Td or Tdap) 07/09/2025  . TETANUS/TDAP  07/09/2025  . COVID-19 Vaccine  Completed  . Hepatitis C Screening   Completed  . PNA vac Low Risk Adult  Completed    Health Maintenance  Health Maintenance Due  Topic Date Due  . INFLUENZA VACCINE  09/23/2019    Colorectal cancer screening: Completed 11/16/2017. Repeat every 2 years  Lung Cancer Screening: (Low Dose CT Chest recommended if Age 34-80 years, 30 pack-year currently smoking OR have quit w/in 15 years.) does not qualify.    Additional Screening:  Hepatitis C Screening: does qualify; Completed 06/27/2015  Vision Screening: Recommended annual ophthalmology exams for early detection of glaucoma and other disorders of the eye. Is the patient up to date with their annual eye exam?  Yes  Who is the provider or what is the name of  the office in which the patient attends annual eye exams? Dr. Macarthur Critchley If pt is not established with a provider, would they like to be referred to a provider to establish care? No .   Dental Screening: Recommended annual dental exams for proper oral hygiene  Community Resource Referral / Chronic Care Management: CRR required this visit?  No   CCM required this visit?  No      Plan:     I have personally reviewed and noted the following in the patient's chart:   . Medical and social history . Use of alcohol, tobacco or illicit drugs  . Current medications and supplements . Functional ability and status . Nutritional status . Physical activity . Advanced directives . List of other physicians . Hospitalizations, surgeries, and ER visits in previous 12 months . Vitals . Screenings to include cognitive, depression, and falls . Referrals and appointments  In addition, I have reviewed and discussed with patient certain preventive protocols, quality metrics, and best practice recommendations. A written personalized care plan for preventive services as well as general preventive health recommendations were provided to patient.   Due to this being a telephonic visit, the after visit summary with patients  personalized plan was offered to patient via mail or my-chart. Patient preferred to pick up at office at next visit.   Andrez Grime, LPN   0/08/1217

## 2019-10-02 NOTE — Patient Instructions (Signed)
Dennis Ayers , Thank you for taking time to come for your Medicare Wellness Visit. I appreciate your ongoing commitment to your health goals. Please review the following plan we discussed and let me know if I can assist you in the future.   Screening recommendations/referrals: Colonoscopy: Up to date, completed 11/16/2017, due 10/2019 Recommended yearly ophthalmology/optometry visit for glaucoma screening and checkup Recommended yearly dental visit for hygiene and checkup  Vaccinations: Influenza vaccine: due, will be available in the office at the end of August Pneumococcal vaccine: Completed series Tdap vaccine: Up to date, completed 07/10/2015, due 06/2025 Shingles vaccine: Completed series   Covid-19: Completed series  Advanced directives: copy in chart  Conditions/risks identified: diabetes, hypertension, hyperlipidemia  Next appointment: Follow up in one year for your annual wellness visit.   Preventive Care 59 Years and Older, Male Preventive care refers to lifestyle choices and visits with your health care provider that can promote health and wellness. What does preventive care include?  A yearly physical exam. This is also called an annual well check.  Dental exams once or twice a year.  Routine eye exams. Ask your health care provider how often you should have your eyes checked.  Personal lifestyle choices, including:  Daily care of your teeth and gums.  Regular physical activity.  Eating a healthy diet.  Avoiding tobacco and drug use.  Limiting alcohol use.  Practicing safe sex.  Taking low doses of aspirin every day.  Taking vitamin and mineral supplements as recommended by your health care provider. What happens during an annual well check? The services and screenings done by your health care provider during your annual well check will depend on your age, overall health, lifestyle risk factors, and family history of disease. Counseling  Your health care  provider may ask you questions about your:  Alcohol use.  Tobacco use.  Drug use.  Emotional well-being.  Home and relationship well-being.  Sexual activity.  Eating habits.  History of falls.  Memory and ability to understand (cognition).  Work and work Statistician. Screening  You may have the following tests or measurements:  Height, weight, and BMI.  Blood pressure.  Lipid and cholesterol levels. These may be checked every 5 years, or more frequently if you are over 11 years old.  Skin check.  Lung cancer screening. You may have this screening every year starting at age 57 if you have a 30-pack-year history of smoking and currently smoke or have quit within the past 15 years.  Fecal occult blood test (FOBT) of the stool. You may have this test every year starting at age 4.  Flexible sigmoidoscopy or colonoscopy. You may have a sigmoidoscopy every 5 years or a colonoscopy every 10 years starting at age 76.  Prostate cancer screening. Recommendations will vary depending on your family history and other risks.  Hepatitis C blood test.  Hepatitis B blood test.  Sexually transmitted disease (STD) testing.  Diabetes screening. This is done by checking your blood sugar (glucose) after you have not eaten for a while (fasting). You may have this done every 1-3 years.  Abdominal aortic aneurysm (AAA) screening. You may need this if you are a current or former smoker.  Osteoporosis. You may be screened starting at age 84 if you are at high risk. Talk with your health care provider about your test results, treatment options, and if necessary, the need for more tests. Vaccines  Your health care provider may recommend certain vaccines, such as:  Influenza  vaccine. This is recommended every year.  Tetanus, diphtheria, and acellular pertussis (Tdap, Td) vaccine. You may need a Td booster every 10 years.  Zoster vaccine. You may need this after age 46.  Pneumococcal  13-valent conjugate (PCV13) vaccine. One dose is recommended after age 22.  Pneumococcal polysaccharide (PPSV23) vaccine. One dose is recommended after age 2. Talk to your health care provider about which screenings and vaccines you need and how often you need them. This information is not intended to replace advice given to you by your health care provider. Make sure you discuss any questions you have with your health care provider. Document Released: 03/07/2015 Document Revised: 10/29/2015 Document Reviewed: 12/10/2014 Elsevier Interactive Patient Education  2017 [place] Prevention in the Home Falls can cause injuries. They can happen to people of all ages. There are many things you can do to make your home safe and to help prevent falls. What can I do on the outside of my home?  Regularly fix the edges of walkways and driveways and fix any cracks.  Remove anything that might make you trip as you walk through a door, such as a raised step or threshold.  Trim any bushes or trees on the path to your home.  Use bright outdoor lighting.  Clear any walking paths of anything that might make someone trip, such as rocks or tools.  Regularly check to see if handrails are loose or broken. Make sure that both sides of any steps have handrails.  Any raised decks and porches should have guardrails on the edges.  Have any leaves, snow, or ice cleared regularly.  Use sand or salt on walking paths during winter.  Clean up any spills in your garage right away. This includes oil or grease spills. What can I do in the bathroom?  Use night lights.  Install grab bars by the toilet and in the tub and shower. Do not use towel bars as grab bars.  Use non-skid mats or decals in the tub or shower.  If you need to sit down in the shower, use a plastic, non-slip stool.  Keep the floor dry. Clean up any water that spills on the floor as soon as it happens.  Remove soap buildup in the  tub or shower regularly.  Attach bath mats securely with double-sided non-slip rug tape.  Do not have throw rugs and other things on the floor that can make you trip. What can I do in the bedroom?  Use night lights.  Make sure that you have a light by your bed that is easy to reach.  Do not use any sheets or blankets that are too big for your bed. They should not hang down onto the floor.  Have a firm chair that has side arms. You can use this for support while you get dressed.  Do not have throw rugs and other things on the floor that can make you trip. What can I do in the kitchen?  Clean up any spills right away.  Avoid walking on wet floors.  Keep items that you use a lot in easy-to-reach places.  If you need to reach something above you, use a strong step stool that has a grab bar.  Keep electrical cords out of the way.  Do not use floor polish or wax that makes floors slippery. If you must use wax, use non-skid floor wax.  Do not have throw rugs and other things on the floor that can  make you trip. What can I do with my stairs?  Do not leave any items on the stairs.  Make sure that there are handrails on both sides of the stairs and use them. Fix handrails that are broken or loose. Make sure that handrails are as long as the stairways.  Check any carpeting to make sure that it is firmly attached to the stairs. Fix any carpet that is loose or worn.  Avoid having throw rugs at the top or bottom of the stairs. If you do have throw rugs, attach them to the floor with carpet tape.  Make sure that you have a light switch at the top of the stairs and the bottom of the stairs. If you do not have them, ask someone to add them for you. What else can I do to help prevent falls?  Wear shoes that:  Do not have high heels.  Have rubber bottoms.  Are comfortable and fit you well.  Are closed at the toe. Do not wear sandals.  If you use a stepladder:  Make sure that it  is fully opened. Do not climb a closed stepladder.  Make sure that both sides of the stepladder are locked into place.  Ask someone to hold it for you, if possible.  Clearly mark and make sure that you can see:  Any grab bars or handrails.  First and last steps.  Where the edge of each step is.  Use tools that help you move around (mobility aids) if they are needed. These include:  Canes.  Walkers.  Scooters.  Crutches.  Turn on the lights when you go into a dark area. Replace any light bulbs as soon as they burn out.  Set up your furniture so you have a clear path. Avoid moving your furniture around.  If any of your floors are uneven, fix them.  If there are any pets around you, be aware of where they are.  Review your medicines with your doctor. Some medicines can make you feel dizzy. This can increase your chance of falling. Ask your doctor what other things that you can do to help prevent falls. This information is not intended to replace advice given to you by your health care provider. Make sure you discuss any questions you have with your health care provider. Document Released: 12/05/2008 Document Revised: 07/17/2015 Document Reviewed: 03/15/2014 Elsevier Interactive Patient Education  2017 [employer].

## 2019-10-02 NOTE — Telephone Encounter (Signed)
Printed documents.  Placed in Dr. Synthia Innocent box.

## 2019-10-05 ENCOUNTER — Other Ambulatory Visit: Payer: Self-pay

## 2019-10-05 ENCOUNTER — Ambulatory Visit (INDEPENDENT_AMBULATORY_CARE_PROVIDER_SITE_OTHER): Payer: Medicare Other | Admitting: Family Medicine

## 2019-10-05 ENCOUNTER — Encounter: Payer: Self-pay | Admitting: Family Medicine

## 2019-10-05 VITALS — BP 140/78 | HR 79 | Temp 98.0°F | Ht 74.5 in | Wt 334.1 lb

## 2019-10-05 DIAGNOSIS — M170 Bilateral primary osteoarthritis of knee: Secondary | ICD-10-CM | POA: Diagnosis not present

## 2019-10-05 DIAGNOSIS — I10 Essential (primary) hypertension: Secondary | ICD-10-CM

## 2019-10-05 DIAGNOSIS — Z0000 Encounter for general adult medical examination without abnormal findings: Secondary | ICD-10-CM | POA: Diagnosis not present

## 2019-10-05 DIAGNOSIS — E785 Hyperlipidemia, unspecified: Secondary | ICD-10-CM

## 2019-10-05 DIAGNOSIS — E118 Type 2 diabetes mellitus with unspecified complications: Secondary | ICD-10-CM

## 2019-10-05 DIAGNOSIS — E1169 Type 2 diabetes mellitus with other specified complication: Secondary | ICD-10-CM

## 2019-10-05 DIAGNOSIS — I2510 Atherosclerotic heart disease of native coronary artery without angina pectoris: Secondary | ICD-10-CM

## 2019-10-05 DIAGNOSIS — I2584 Coronary atherosclerosis due to calcified coronary lesion: Secondary | ICD-10-CM

## 2019-10-05 MED ORDER — ATORVASTATIN CALCIUM 40 MG PO TABS: 40.0000 mg | ORAL_TABLET | Freq: Every day | ORAL | 3 refills | Status: DC

## 2019-10-05 MED ORDER — METFORMIN HCL 500 MG PO TABS: ORAL_TABLET | ORAL | 3 refills | Status: DC

## 2019-10-05 MED ORDER — LOSARTAN POTASSIUM 50 MG PO TABS: 50.0000 mg | ORAL_TABLET | Freq: Every day | ORAL | 3 refills | Status: DC

## 2019-10-05 MED ORDER — FREESTYLE LIBRE 14 DAY SENSOR MISC: 1.0000 [IU] | 3 refills | Status: DC

## 2019-10-05 NOTE — Assessment & Plan Note (Signed)
Continue lipitor, aspirin.

## 2019-10-05 NOTE — Assessment & Plan Note (Signed)
Chronic, adequate. Continue losartan.

## 2019-10-05 NOTE — Assessment & Plan Note (Signed)
Preventative protocols reviewed and updated unless pt declined. Discussed healthy diet and lifestyle.  

## 2019-10-05 NOTE — Patient Instructions (Signed)
Work on [employer] choices for goal sustainable weight loss.  You are doing well today, sugars are doing great.  Continue current medicines Return in 6 months for diabetes check.  Expect a call from GI later this year.   Health Maintenance After Age 70 After age 63, you are at a higher risk for certain long-term diseases and infections as well as injuries from falls. Falls are a major cause of broken bones and head injuries in people who are older than age 25. Getting regular preventive care can help to keep you healthy and well. Preventive care includes getting regular testing and making lifestyle changes as recommended by your health care provider. Talk with your health care provider about:  Which screenings and tests you should have. A screening is a test that checks for a disease when you have no symptoms.  A diet and exercise plan that is right for you. What should I know about screenings and tests to prevent falls? Screening and testing are the best ways to find a health problem early. Early diagnosis and treatment give you the best chance of managing medical conditions that are common after age 19. Certain conditions and lifestyle choices may make you more likely to have a fall. Your health care provider may recommend:  Regular vision checks. Poor vision and conditions such as cataracts can make you more likely to have a fall. If you wear glasses, make sure to get your prescription updated if your vision changes.  Medicine review. Work with your health care provider to regularly review all of the medicines you are taking, including over-the-counter medicines. Ask your health care provider about any side effects that may make you more likely to have a fall. Tell your health care provider if any medicines that you take make you feel dizzy or sleepy.  Osteoporosis screening. Osteoporosis is a condition that causes the bones to get weaker. This can make the bones weak and cause them to break  more easily.  Blood pressure screening. Blood pressure changes and medicines to control blood pressure can make you feel dizzy.  Strength and balance checks. Your health care provider may recommend certain tests to check your strength and balance while standing, walking, or changing positions.  Foot health exam. Foot pain and numbness, as well as not wearing proper footwear, can make you more likely to have a fall.  Depression screening. You may be more likely to have a fall if you have a fear of falling, feel emotionally low, or feel unable to do activities that you used to do.  Alcohol use screening. Using too much alcohol can affect your balance and may make you more likely to have a fall. What actions can I take to lower my risk of falls? General instructions  Talk with your health care provider about your risks for falling. Tell your health care provider if: ? You fall. Be sure to tell your health care provider about all falls, even ones that seem minor. ? You feel dizzy, sleepy, or off-balance.  Take over-the-counter and prescription medicines only as told by your health care provider. These include any supplements.  Eat a healthy diet and maintain a healthy weight. A healthy diet includes low-fat dairy products, low-fat (lean) meats, and fiber from whole grains, beans, and lots of fruits and vegetables. Home safety  Remove any tripping hazards, such as rugs, cords, and clutter.  Install safety equipment such as grab bars in bathrooms and safety rails on stairs.  Keep  rooms and walkways well-lit. Activity   Follow a regular exercise program to stay fit. This will help you maintain your balance. Ask your health care provider what types of exercise are appropriate for you.  If you need a cane or walker, use it as recommended by your health care provider.  Wear supportive shoes that have nonskid soles. Lifestyle  Do not drink alcohol if your health care provider tells you not  to drink.  If you drink alcohol, limit how much you have: ? 0-1 drink a day for women. ? 0-2 drinks a day for men.  Be aware of how much alcohol is in your drink. In the [place] one drink equals one typical bottle of beer (12 oz), one-half glass of wine (5 oz), or one shot of hard liquor (1 oz).  Do not use any products that contain nicotine or tobacco, such as cigarettes and e-cigarettes. If you need help quitting, ask your health care provider. Summary  Having a healthy lifestyle and getting preventive care can help to protect your health and wellness after age 73.  Screening and testing are the best way to find a health problem early and help you avoid having a fall. Early diagnosis and treatment give you the best chance for managing medical conditions that are more common for people who are older than age 97.  Falls are a major cause of broken bones and head injuries in people who are older than age 71. Take precautions to prevent a fall at home.  Work with your health care provider to learn what changes you can make to improve your health and wellness and to prevent falls. This information is not intended to replace advice given to you by your health care provider. Make sure you discuss any questions you have with your health care provider. Document Revised: 06/01/2018 Document Reviewed: 12/22/2016 Elsevier Patient Education  2020 [employer].

## 2019-10-05 NOTE — Assessment & Plan Note (Signed)
Discussed healthy diet and lifestyle choices to affect sustainable weight loss.  

## 2019-10-05 NOTE — Assessment & Plan Note (Addendum)
Chronic, stable. Continue current regimen.  Discussed criteria for medicare coverage He prefers to continue CGM - self pay.

## 2019-10-05 NOTE — Assessment & Plan Note (Signed)
Chronic, continue atorvastatin. The ASCVD Risk score Dennis Bussing DC Jr., Dennis al., 2013) failed to calculate for the following reasons:   The valid total cholesterol range is 130 to 320 mg/dL

## 2019-10-05 NOTE — Assessment & Plan Note (Signed)
Chronic, s/p L knee replacement, knee OA limits activity.

## 2019-10-05 NOTE — Progress Notes (Signed)
This visit was conducted in person.  BP 140/78 (BP Location: Right Arm, Patient Position: Sitting, Cuff Size: Large)   Pulse 79   Temp 98 F (36.7 C) (Temporal)   Ht 6' 2.5" (1.892 m)   Wt (!) 334 lb 1 oz (151.5 kg)   SpO2 96%   BMI 42.32 kg/m   BP Readings from Last 3 Encounters:  10/05/19 140/78  04/06/19 124/76  10/02/18 130/68    CC: CPE Subjective:    Patient ID: Dennis Ayers, male    DOB: 07/09/1950, 69 y.o.   MRN: [id number]  HPI: Dennis Ayers is a 69 y.o. male presenting on 10/05/2019 for Annual Exam (Prt 2.  ) and Medication Refill (Needs refill for FreeStyle Libre sensors sent to [facility]. )   Saw health advisor this week for medicare wellness visit. Note reviewed.    No exam data present    Clinical Support from 10/02/2019 in [place] at [facility] Total Score 0      Fall Risk  10/02/2019 09/25/2018 09/09/2017 09/03/2016 07/10/2015  Falls in the past year? 0 1 No No No  Number falls in past yr: 0 - - - -  Injury with Fall? 0 0 - - -  Comment - lost balance - - -  Risk for fall due to : Medication side effect History of fall(s);Medication side effect - - -  Follow up Falls evaluation completed;Falls prevention discussed Falls evaluation completed;Falls prevention discussed - - -    DM - wonderful control using freestyle libre CGM - asks about medicare coverage. Only on metformin 500mg  once daily. Brings average glucose log  117-142 throughout the day over the past 3 months. Weight gain noted - less active due to heat.   Preventative: COLONOSCOPY Date:06/2015 mult TAs, severe diverticulosis, 1 large 2.5cm cecal polyp pending resection([place])- 08/2015 large TA, rpt 1 yr Carlean Purl) COLONOSCOPY WITH PROPOFOL 10/29/2016 TA, rpt 1 yr Carlean Purl, Ofilia Neas, MD) COLONOSCOPY 11/2017 - 7 TAs, 3 SSPs, diverticulosis, rpt 2 yrs Carlean Purl) Prostate cancer screening - discussed. Continue screening.  Lung cancer screening - never smoker  AAA screen -  never smoker, no fmhx AAA  [facility], [place] 03/2019, 04/2019  prevnar 06/2015, pneumovax 10/2015. Zostavax- 2013 Shingrix - 10/2017, 02/2018 Hep B - completed  Advanced directive discussion - has at home. HCPOA iswifeand son.No prolonged life support if terminal illness. Copy in chart 08/2015. Seat belt use discussed  Sunscreen use discussed, no changing moles on skin.  Non smoker Alcohol - none Dentist - Q6 mo Eye exam - yearly  Caffeine: 2-3 diet sodas/day Lives with wife 1 grown son. Occupation: Self-employed; Psychologist, forensic Activity: some golf, limited by knee pain  Diet: good water, daily vegetables     Relevant past medical, surgical, family and social history reviewed and updated as indicated. Interim medical history since our last visit reviewed. Allergies and medications reviewed and updated. Outpatient Medications Prior to Visit  Medication Sig Dispense Refill  . aspirin EC 81 MG tablet Take 81 mg by mouth daily.    . DiphenhydrAMINE HCl, Sleep, (UNISOM SLEEPMELTS) 25 MG TBDP Take 25 mg by mouth at bedtime.     Marland Kitchen ibuprofen (ADVIL,MOTRIN) 200 MG tablet Take 600 mg by mouth daily as needed for moderate pain.    Varnell Orvis Docker Oil 300 MG CAPS Take 300 mg by mouth daily.    . Misc Natural Products (OSTEO BI-FLEX  JOINT SHIELD PO) Take 1 capsule by mouth daily.     . Multiple Vitamin (MULTIVITAMIN) tablet Take 1 tablet by mouth daily.    Marland Kitchen atorvastatin (LIPITOR) 40 MG tablet Take 1 tablet (40 mg total) by mouth daily at 6 PM. 90 tablet 3  . Continuous Blood Gluc Sensor (FREESTYLE LIBRE 14 DAY SENSOR) MISC 1 Units by Does not apply route every 14 (fourteen) days. 6 each 3  . losartan (COZAAR) 50 MG tablet Take 1 tablet (50 mg total) by mouth daily. 90 tablet 3  . metFORMIN (GLUCOPHAGE) 500 MG tablet TAKE 1 TABLET BY MOUTH EVERY DAY WITH BREAKFAST 90 tablet 2  . glucose blood (ONE TOUCH ULTRA TEST) test strip  USE AS INSTRUCTED TO CHECK BLOOD SUGAR ONCE A DAY AS NEEDED. DX CODE: E11.9 (Patient not taking: Reported on 10/05/2019) 25 each 3  . ONETOUCH DELICA LANCETS 40J MISC Use as directed to check blood sugar once daily as needed.  Dx code: E11.9 (Patient not taking: Reported on 10/05/2019) 100 each 1   No facility-administered medications prior to visit.     Per HPI unless specifically indicated in ROS section below Review of Systems  Constitutional: Negative for activity change, appetite change, chills, fatigue, fever and unexpected weight change.  HENT: Negative for hearing loss.   Eyes: Negative for visual disturbance.  Respiratory: Positive for cough (mild). Negative for chest tightness, shortness of breath and wheezing.   Cardiovascular: Negative for chest pain, palpitations and leg swelling.  Gastrointestinal: Negative for abdominal distention, abdominal pain, blood in stool, constipation, diarrhea, nausea and vomiting.  Genitourinary: Negative for difficulty urinating and hematuria.  Musculoskeletal: Negative for arthralgias, myalgias and neck pain.  Skin: Negative for rash.  Neurological: Negative for dizziness, seizures, syncope and headaches.  Hematological: Negative for adenopathy. Does not bruise/bleed easily.  Psychiatric/Behavioral: Negative for dysphoric mood. The patient is not nervous/anxious.    Objective:  BP 140/78 (BP Location: Right Arm, Patient Position: Sitting, Cuff Size: Large)   Pulse 79   Temp 98 F (36.7 C) (Temporal)   Ht 6' 2.5" (1.892 m)   Wt (!) 334 lb 1 oz (151.5 kg)   SpO2 96%   BMI 42.32 kg/m   Wt Readings from Last 3 Encounters:  10/05/19 (!) 334 lb 1 oz (151.5 kg)  04/06/19 (!) 326 lb 9 oz (148.1 kg)  10/02/18 (!) 324 lb 9 oz (147.2 kg)      Physical Exam Vitals and nursing note reviewed.  Constitutional:      General: He is not in acute distress.    Appearance: Normal appearance. He is well-developed. He is obese. He is not ill-appearing.    HENT:     Head: Normocephalic and atraumatic.     Right Ear: Hearing, tympanic membrane, ear canal and external ear normal.     Left Ear: Hearing, tympanic membrane, ear canal and external ear normal.  Eyes:     General: No scleral icterus.    Extraocular Movements: Extraocular movements intact.     Conjunctiva/sclera: Conjunctivae normal.     Pupils: Pupils are equal, round, and reactive to light.  Neck:     Thyroid: No thyroid mass, thyromegaly or thyroid tenderness.     Vascular: No carotid bruit.  Cardiovascular:     Rate and Rhythm: Normal rate and regular rhythm.     Pulses: Normal pulses.          Radial pulses are 2+ on the right side and 2+ on the left  side.     Heart sounds: Normal heart sounds. No murmur heard.   Pulmonary:     Effort: Pulmonary effort is normal. No respiratory distress.     Breath sounds: Normal breath sounds. No wheezing, rhonchi or rales.  Abdominal:     General: Abdomen is flat. Bowel sounds are normal. There is no distension.     Palpations: Abdomen is soft. There is no mass.     Tenderness: There is no abdominal tenderness. There is no guarding or rebound.     Hernia: No hernia is present.  Musculoskeletal:        General: Normal range of motion.     Cervical back: Normal range of motion and neck supple.     Right lower leg: No edema.     Left lower leg: No edema.  Lymphadenopathy:     Cervical: No cervical adenopathy.  Skin:    General: Skin is warm and dry.     Findings: No rash.  Neurological:     General: No focal deficit present.     Mental Status: He is alert and oriented to person, place, and time.     Comments: CN grossly intact, station and gait intact  Psychiatric:        Mood and Affect: Mood normal.        Behavior: Behavior normal.        Thought Content: Thought content normal.        Judgment: Judgment normal.       Results for orders placed or performed in visit on 10/02/19  PSA  Result Value Ref Range   PSA 1.60  0.10 - 4.00 ng/mL  Hemoglobin A1c  Result Value Ref Range   Hgb A1c MFr Bld 6.4 4.6 - 6.5 %  Comprehensive metabolic panel  Result Value Ref Range   Sodium 141 135 - 145 mEq/L   Potassium 4.7 3.5 - 5.1 mEq/L   Chloride 103 96 - 112 mEq/L   CO2 30 19 - 32 mEq/L   Glucose, Bld 157 (H) 70 - 99 mg/dL   BUN 23 6 - 23 mg/dL   Creatinine, Ser 1.03 0.40 - 1.50 mg/dL   Total Bilirubin 0.8 0.2 - 1.2 mg/dL   Alkaline Phosphatase 55 39 - 117 U/L   AST 15 0 - 37 U/L   ALT 25 0 - 53 U/L   Total Protein 6.6 6.0 - 8.3 g/dL   Albumin 4.2 3.5 - 5.2 g/dL   GFR 71.41 >60.00 mL/min   Calcium 9.1 8.4 - 10.5 mg/dL  Lipid panel  Result Value Ref Range   Cholesterol 99 0 - 200 mg/dL   Triglycerides 43.0 0 - 149 mg/dL   HDL 53.50 >39.00 mg/dL   VLDL 8.6 0.0 - 40.0 mg/dL   LDL Cholesterol 37 0 - 99 mg/dL   Total CHOL/HDL Ratio 2    NonHDL 45.99   CBC with Differential/Platelet  Result Value Ref Range   WBC 5.1 4.0 - 10.5 K/uL   RBC 4.78 4.22 - 5.81 Mil/uL   Hemoglobin 15.7 13.0 - 17.0 g/dL   HCT 45.2 39 - 52 %   MCV 94.5 78.0 - 100.0 fl   MCHC 34.8 30.0 - 36.0 g/dL   RDW 13.2 11.5 - 15.5 %   Platelets 201.0 150 - 400 K/uL   Neutrophils Relative % 54.2 43 - 77 %   Lymphocytes Relative 32.7 12 - 46 %   Monocytes Relative 8.9 3 - 12 %  Eosinophils Relative 3.5 0 - 5 %   Basophils Relative 0.7 0 - 3 %   Neutro Abs 2.8 1.4 - 7.7 K/uL   Lymphs Abs 1.7 0.7 - 4.0 K/uL   Monocytes Absolute 0.5 0 - 1 K/uL   Eosinophils Absolute 0.2 0 - 0 K/uL   Basophils Absolute 0.0 0 - 0 K/uL   Assessment & Plan:  This visit occurred during the SARS-CoV-2 public health emergency.  Safety protocols were in place, including screening questions prior to the visit, additional usage of staff PPE, and extensive cleaning of exam room while observing appropriate contact time as indicated for disinfecting solutions.   Problem List Items Addressed This Visit    Obesity, morbid, BMI 40.0-49.9 ([place])    Discussed healthy  diet and lifestyle choices to affect sustainable weight loss.       Relevant Medications   metFORMIN (GLUCOPHAGE) 500 MG tablet   Knee osteoarthritis    Chronic, s/p L knee replacement, knee OA limits activity.       Hyperlipidemia associated with type 2 diabetes mellitus (HCC) - goal LDL <70    Chronic, continue atorvastatin. The ASCVD Risk score Mikey Bussing DC Jr., et al., 2013) failed to calculate for the following reasons:   The valid total cholesterol range is 130 to 320 mg/dL       Relevant Medications   atorvastatin (LIPITOR) 40 MG tablet   metFORMIN (GLUCOPHAGE) 500 MG tablet   losartan (COZAAR) 50 MG tablet   Health maintenance examination - Primary    Preventative protocols reviewed and updated unless pt declined. Discussed healthy diet and lifestyle.       Essential hypertension    Chronic, adequate. Continue losartan.       Relevant Medications   atorvastatin (LIPITOR) 40 MG tablet   losartan (COZAAR) 50 MG tablet   Coronary artery calcification    Continue lipitor, aspirin.       Relevant Medications   atorvastatin (LIPITOR) 40 MG tablet   losartan (COZAAR) 50 MG tablet   Controlled diabetes mellitus type 2 with complications (HCC)    Chronic, stable. Continue current regimen.  Discussed criteria for medicare coverage He prefers to continue CGM - self pay.      Relevant Medications   atorvastatin (LIPITOR) 40 MG tablet   metFORMIN (GLUCOPHAGE) 500 MG tablet   losartan (COZAAR) 50 MG tablet       Meds ordered this encounter  Medications  . atorvastatin (LIPITOR) 40 MG tablet    Sig: Take 1 tablet (40 mg total) by mouth daily at 6 PM.    Dispense:  90 tablet    Refill:  3  . metFORMIN (GLUCOPHAGE) 500 MG tablet    Sig: TAKE 1 TABLET BY MOUTH EVERY DAY WITH BREAKFAST    Dispense:  90 tablet    Refill:  3  . losartan (COZAAR) 50 MG tablet    Sig: Take 1 tablet (50 mg total) by mouth daily.    Dispense:  90 tablet    Refill:  3  . Continuous Blood  Gluc Sensor (FREESTYLE LIBRE 14 DAY SENSOR) MISC    Sig: 1 Units by Does not apply route every 14 (fourteen) days.    Dispense:  6 each    Refill:  3   No orders of the defined types were placed in this encounter.   Patient instructions: Work on [employer] choices for goal sustainable weight loss.  You are doing well today, sugars are doing great.  Continue current medicines Return in 6 months for diabetes check.  Expect a call from GI later this year.   Follow up plan: Return in about 6 months (around 04/06/2020) for follow up visit.  Ria Bush, MD

## 2019-11-20 ENCOUNTER — Encounter: Payer: Self-pay | Admitting: Gastroenterology

## 2019-12-12 ENCOUNTER — Encounter: Payer: Self-pay | Admitting: Family Medicine

## 2019-12-12 NOTE — Telephone Encounter (Signed)
Updated pt's chart.  

## 2019-12-19 ENCOUNTER — Other Ambulatory Visit: Payer: Self-pay

## 2019-12-19 ENCOUNTER — Encounter: Payer: Self-pay | Admitting: Physician Assistant

## 2019-12-19 ENCOUNTER — Ambulatory Visit: Payer: Medicare Other | Admitting: Physician Assistant

## 2019-12-19 VITALS — BP 120/70 | HR 86 | Ht 74.5 in | Wt 333.5 lb

## 2019-12-19 DIAGNOSIS — I2584 Coronary atherosclerosis due to calcified coronary lesion: Secondary | ICD-10-CM | POA: Diagnosis not present

## 2019-12-19 DIAGNOSIS — E785 Hyperlipidemia, unspecified: Secondary | ICD-10-CM

## 2019-12-19 DIAGNOSIS — I10 Essential (primary) hypertension: Secondary | ICD-10-CM | POA: Diagnosis not present

## 2019-12-19 DIAGNOSIS — Z6841 Body Mass Index (BMI) 40.0 and over, adult: Secondary | ICD-10-CM | POA: Diagnosis not present

## 2019-12-19 DIAGNOSIS — I2510 Atherosclerotic heart disease of native coronary artery without angina pectoris: Secondary | ICD-10-CM

## 2019-12-19 MED ORDER — ATORVASTATIN CALCIUM 20 MG PO TABS: 20.0000 mg | ORAL_TABLET | Freq: Every day | ORAL | 3 refills | Status: DC

## 2019-12-19 NOTE — Patient Instructions (Signed)
Medication Instructions:  Your physician has recommended you make the following change in your medication:   1.  DECREASE your Lipitor to 20mg  by mouth daily.  *If you need a refill on your cardiac medications before your next appointment, please call your pharmacy*   Lab Work: None Ordered If you have labs (blood work) drawn today and your tests are completely normal, you will receive your results only by:  [place] (if you have MyChart) OR  A paper copy in the mail If you have any lab test that is abnormal or we need to change your treatment, we will call you to review the results.   Testing/Procedures: None Ordered   Follow-Up: At [facility], you and your health needs are our priority.  As part of our continuing mission to provide you with exceptional heart care, we have created designated Provider Care Teams.  These Care Teams include your primary Cardiologist (physician) and Advanced Practice Providers (APPs -  Physician Assistants and Nurse Practitioners) who all work together to provide you with the care you need, when you need it.  We recommend signing up for the patient portal called "MyChart".  Sign up information is provided on this After Visit Summary.  MyChart is used to connect with patients for Virtual Visits (Telemedicine).  Patients are able to view lab/test results, encounter notes, upcoming appointments, etc.  Non-urgent messages can be sent to your provider as well.   To learn more about what you can do with MyChart, go to [email or web address].    Your next appointment:   1 year(s)  The format for your next appointment:   In Person  Provider:   You may see Dr. Harrell Gave End or one of the following Advanced Practice Providers on your designated Care Team:    Murray Hodgkins, NP  Christell Faith, PA-C  Marrianne Mood, PA-C  Cadence Kathlen Mody, [place]    Other Instructions

## 2019-12-19 NOTE — Progress Notes (Signed)
Office Visit    Patient Name: Dennis Ayers Date of Encounter: 12/19/2019  Primary Care Provider:  Ria Bush, MD Primary Cardiologist:  Dennis Bush, MD  Chief Complaint    Chief Complaint  Patient presents with  . Follow-up    12 month. Meds reviewed by the pt. verbally. "doing well."     69 year old male with history of coronary artery calcification, DM2, hypertension, morbid obesity, obstructive sleep apnea, degenerative joint disease, and who presents today for follow-up.  Past Medical History    Past Medical History:  Diagnosis Date  . Benign neoplasm of cecum - 25 mm polyp 08/06/2015  . Coronary artery calcification   . Diabetes mellitus without complication ([place])   . DJD (degenerative joint disease) of knee    [place] steroid injection (01/2014)  . Family history of ovarian cancer   . Hyperglycemia   . Hypertension   . Jaundice    with icterus, when very young, treated and resolved  . Knee pain   . Morbid obesity ([place])   . OSA (obstructive sleep apnea)    mild to mod, working on weight loss, no cpap needed per test  . Osteoarthritis of knee 07/23/2011   blat s/p L TKR ([place])  . Redundant colon 11/16/2017  . Severe obesity (BMI >= 40) ([place]) 02/15/2008  . Sleep apnea    does not have a cpap  . Viral hepatitis 8th grade   has had jaundice in past, unsure of cause ?Hep A   Past Surgical History:  Procedure Laterality Date  . CHOLECYSTECTOMY  1998  . COLONOSCOPY  08/20/2011   4 adenomatous polyps - rpt 3 yrs Dennis Ayers)  . COLONOSCOPY  07-22-15   mult TAs, severe diverticulosis, one large 2.5cm cecal polyp pending resection Dennis Ayers)  . COLONOSCOPY N/A 09/10/2015   large TA, rpt 1 yr Dennis Mayer, MD)  . COLONOSCOPY  11/2017   7 TAs, 3 SSPs, diverticulosis, rpt 2 yrs Dennis Ayers)  . COLONOSCOPY WITH PROPOFOL N/A 10/29/2016   TA, rpt 1 yr Dennis Ayers, Dennis Neas, MD)  . EYE SURGERY Right    laser  . HOT HEMOSTASIS N/A 10/29/2016   Procedure: HOT  HEMOSTASIS (ARGON PLASMA COAGULATION/BICAP);  Surgeon: Dennis Mayer, MD;  Location: [facility];  Service: Endoscopy;  Laterality: N/A;  . TONSILLECTOMY    . TOTAL KNEE ARTHROPLASTY Left 07/25/2012   Surgeon: Dennis Bridge, MD  . 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 Alphonzo Severance    Allergies  Allergies  Allergen Reactions  . Lisinopril Other (See Comments)    cough    History of Present Illness    Dennis Ayers is a 69 y.o. male with PMH as above.   He was last seen by his primary cardiologist, Dr. Saunders Ayers, 09/15/2018. He was doing well, other than reported weight gain attributed to dietary indiscretion and decreased activity in the setting of COVID-19 pandemic. He was continued on his current medications. It was noted that his LDL remained below 50 at repeat labs, atorvastatin could be decreased. Increased activity and weight loss was encouraged.  Today, 12/19/2019, he returns to clinic and notes he is doing well from a cardiac standpoint. He denies any chest pain, racing heart rate, palpitations, presyncope, syncope, orthopnea, PND, or early satiety. He reports that he has cut back on his salt. He remains active and estimates an average of 1 to 2 miles of walking per day. He greatly enjoys gardening and has been doing  a lot of this with the nicer weather. He denies any CP or DOE with exertion. He reports he is s/p L knee replacement but that he avoids ibuprofen / Advil and uses Tylenol on occasion if needed. Wt today 333lbs with previous clinic wt 323lbs. He has been using the [place] sensor to monitor his glucose (scans with his phone). He received the COVID-19 booster vaccine at the end of September. He reports medication compliance. Overall, he is doing very well.   Home Medications    Current Outpatient Medications on File Prior to Visit  Medication Sig Dispense Refill  . aspirin EC 81 MG tablet Take 81 mg by mouth daily.    Marland Kitchen atorvastatin (LIPITOR) 40 MG tablet Take 1 tablet (40 mg  total) by mouth daily at 6 PM. 90 tablet 3  . Continuous Blood Gluc Sensor (FREESTYLE LIBRE 14 DAY SENSOR) MISC 1 Units by Does not apply route every 14 (fourteen) days. 6 each 3  . DiphenhydrAMINE HCl, Sleep, (UNISOM SLEEPMELTS) 25 MG TBDP Take 25 mg by mouth at bedtime.     Marland Kitchen glucose blood (ONE TOUCH ULTRA TEST) test strip USE AS INSTRUCTED TO CHECK BLOOD SUGAR ONCE A DAY AS NEEDED. DX CODE: E11.9 25 each 3  . ibuprofen (ADVIL,MOTRIN) 200 MG tablet Take 600 mg by mouth daily as needed for moderate pain.    Dennis Ayers Oil 300 MG CAPS Take 300 mg by mouth daily.    Marland Kitchen losartan (COZAAR) 50 MG tablet Take 1 tablet (50 mg total) by mouth daily. 90 tablet 3  . metFORMIN (GLUCOPHAGE) 500 MG tablet TAKE 1 TABLET BY MOUTH EVERY DAY WITH BREAKFAST 90 tablet 3  . Misc Natural Products (OSTEO BI-FLEX JOINT SHIELD PO) Take 1 capsule by mouth daily.     . Multiple Vitamin (MULTIVITAMIN) tablet Take 1 tablet by mouth daily.    Dennis Ayers DELICA LANCETS 56L MISC Use as directed to check blood sugar once daily as needed.  Dx code: E11.9 100 each 1   No current facility-administered medications on file prior to visit.    Review of Systems    He denies chest pain, palpitations, dyspnea, pnd, orthopnea, n, v, dizziness, syncope, edema, weight gain, or early satiety.    All other systems reviewed and are otherwise negative except as noted above.  Physical Exam    VS:  BP 120/70 (BP Location: Right Arm, Patient Position: Sitting, Cuff Size: Large)   Pulse 86   Ht 6' 2.5" (1.892 m)   Wt (!) 333 lb 8 oz (151.3 kg)   SpO2 97%   BMI 42.25 kg/m  , BMI Body mass index is 42.25 kg/m. GEN: NAD, facemask in place. HEENT: normal. Neck: Supple, JVD difficult to assess due to body habitus.  No carotid bruits or masses. Cardiac: RRR, no murmurs, rubs, or gallops. No clubbing, cyanosis. No pitting edema.  Radials/DP/PT 2+ and equal bilaterally.  Respiratory:  Respirations regular and unlabored, clear to auscultation  bilaterally. GI: Soft, nontender, nondistended, BS + x 4. MS: no deformity or atrophy. Skin: warm and dry, no rash. Neuro:  Strength and sensation are intact. Psych: Normal affect.  Accessory Clinical Findings    ECG personally reviewed by me today -NSR, 86 bpm, left axis deviation, PR interval 186 ms, QRS 92 ms, poor R wave progression in inferior leads, poor R wave progression in precordial leads- no acute changes.  VITALS Reviewed today   Temp Readings from Last 3 Encounters:  10/05/19 98 F (36.7  C) (Temporal)  04/06/19 98 F (36.7 C) (Temporal)  10/02/18 97.9 F (36.6 C) (Temporal)   BP Readings from Last 3 Encounters:  12/19/19 120/70  10/05/19 140/78  04/06/19 124/76   Pulse Readings from Last 3 Encounters:  12/19/19 86  10/05/19 79  04/06/19 82    Wt Readings from Last 3 Encounters:  12/19/19 (!) 333 lb 8 oz (151.3 kg)  10/05/19 (!) 334 lb 1 oz (151.5 kg)  04/06/19 (!) 326 lb 9 oz (148.1 kg)     LABS  reviewed today   Lab Results  Component Value Date   WBC 5.1 10/02/2019   HGB 15.7 10/02/2019   HCT 45.2 10/02/2019   MCV 94.5 10/02/2019   PLT 201.0 10/02/2019   Lab Results  Component Value Date   CREATININE 1.03 10/02/2019   BUN 23 10/02/2019   NA 141 10/02/2019   K 4.7 10/02/2019   CL 103 10/02/2019   CO2 30 10/02/2019   Lab Results  Component Value Date   ALT 25 10/02/2019   AST 15 10/02/2019   ALKPHOS 55 10/02/2019   BILITOT 0.8 10/02/2019   Lab Results  Component Value Date   CHOL 99 10/02/2019   HDL 53.50 10/02/2019   LDLCALC 37 10/02/2019   TRIG 43.0 10/02/2019   CHOLHDL 2 10/02/2019    Lab Results  Component Value Date   HGBA1C 6.4 10/02/2019   Lab Results  Component Value Date   TSH 1.48 03/16/2002     STUDIES/PROCEDURES reviewed today   Cardiovascular History & Procedures: Cardiovascular Problems:  Coronary artery calcification  Dilated thoracic aortaby echo; upper normal on cardiac CT  Risk  Factors:  Hypertension, diabetes mellitus, obesity, sedentary lifestyle, male gender, age greater than 40, and family history  Cath/PCI:  None  CV Surgery:  None  EP Procedures and Devices:  None  Non-Invasive Evaluation(s):  Exercise tolerance test (08/23/2016): Poor exercise capacity. No ischemia at workload achieved (4.8 METS). Intermediate risk study ([facility] treadmill score 3) due to poor exercise capacity.  TTE (06/29/16): Normal LV size with mild LVH. LVEF 55-60% with normal wall motion. Normal diastolic function. Mildly dilated aortic root, measuring 4.0 cm. Mild left atrial enlargement. Normal RV size and function. No significant valvular abnormalities.  Coronary calcium score (06/09/16): Calcification of all 3 coronary arteries noted within a score of 102Agatstonunits (53rd percentile for age and sex matched cohort).Ascendingaorta measuring 3.6 cm in diameter. No significant extracardiac abnormalities in the visualized thorax.   Assessment & Plan    Coronary artery calcification -No symptoms to suggest worsening coronary insufficiency.  Previous 2018 coronary artery calcium score of 102 as above.  EKG without acute ST/T changes.  Continue current medications.  Continue lifestyle modifications as discussed for risk factor reduction, including diet and exercise.   Hypertension --BP well controlled.  No medication changes.  HLD --LDL remains below 50 with Lipitor decreased from Lipitor 40 mg daily to Lipitor 20 mg daily today.  Continue annual recheck of lipids per PCP.  BMI 42.0-43.0 --Weight remains stable from his previous 09/15/2018 visit.  As previously noted, weight has been attributed to dietary indiscretion and decreased activity in the setting of the COVID-19 pandemic.  Ongoing increased activity encouraged, as well as ongoing dietary changes.  Medication changes: Decrease to Lipitor 20 mg daily Disposition: RTC 1 year    Arvil Chaco,  PA-C 12/19/2019

## 2019-12-24 HISTORY — PX: COLONOSCOPY: SHX174

## 2019-12-31 ENCOUNTER — Encounter: Payer: Self-pay | Admitting: Family Medicine

## 2019-12-31 MED ORDER — ONETOUCH DELICA LANCETS 33G MISC: 3 refills | Status: DC

## 2019-12-31 MED ORDER — GLUCOSE BLOOD VI STRP: ORAL_STRIP | 3 refills | Status: DC

## 2019-12-31 NOTE — Telephone Encounter (Signed)
E-scribed refills.  

## 2020-01-03 ENCOUNTER — Other Ambulatory Visit: Payer: Self-pay

## 2020-01-03 ENCOUNTER — Ambulatory Visit: Payer: Medicare Other

## 2020-01-03 DIAGNOSIS — I10 Essential (primary) hypertension: Secondary | ICD-10-CM

## 2020-01-03 DIAGNOSIS — E118 Type 2 diabetes mellitus with unspecified complications: Secondary | ICD-10-CM

## 2020-01-03 NOTE — Patient Instructions (Addendum)
Dear Dennis Ayers,  Below is a summary of the goals we discussed during our follow up appointment on January 03, 2020. Please contact me anytime with questions or concerns.   Visit Information  Goals Addressed            This Visit's Progress   . Pharmacy Care Plan       CARE PLAN ENTRY  Current Barriers:  . Chronic Disease Management support, education, and care coordination needs related to Hypertension and Diabetes   Hypertension . Pharmacist Clinical Goal(s): o Over the next 6 months, patient will work with PharmD and providers to maintain BP goal <130/80 mmHg . Current regimen:  o Losartan 50 mg - 1 tablet daily . Interventions: o Recommend monitoring BP at least once monthly  . Patient self care activities - Over the next 6 months, patient will: o Check blood pressure monthly, document, and provide at future appointments o Ensure daily salt intake < 2300 mg/day o Continue exercising with goal of 30 minutes, 5 days per week   Diabetes . Pharmacist Clinical Goal(s): o Over the next 6 months, patient will work with PharmD and providers to maintain A1c goal <7% . Current regimen:  o Metformin 500 mg - 1 tablet daily with breakfast . Interventions: o Continue current medications . Patient self care activities - Over the next 6 months, patient will: o Continue to monitor blood glucose with freestyle libre o Adhere to heart healthy diet low in carbohydrates such as the diabetic plate method  o Contact provider with any episodes of hypoglycemia (blood glucose < 70 mg/dL)  Additional recommendations: - Appropriate over the counter medication use: Limit use of Unisom for sleep due to increased risk of side effects in adults > 65 including dizziness, dry mouth, cognitive impairment, and constipation. Try reducing use to 1/2 tablet daily for 2 weeks, then every other day for 2 weeks, then use as needed.   Please see past updates related to this goal by clicking on the  "Past Updates" button in the selected goal       Telephone follow up appointment with pharmacy team member scheduled for:  10/03/19 at 9:15 AM (telephone)  Debbora Dus, PharmD Clinical Pharmacist [facility]   [place] refers to food and lifestyle choices that are based on the traditions of countries located on the [place]. This way of eating has been shown to help prevent certain conditions and improve outcomes for people who have chronic diseases, like kidney disease and heart disease. What are tips for following this plan? Lifestyle  Cook and eat meals together with your family, when possible.  Drink enough fluid to keep your urine clear or pale yellow.  Be physically active every day. This includes: ? Aerobic exercise like running or swimming. ? Leisure activities like gardening, walking, or housework.  Get 7-8 hours of sleep each night.  If recommended by your health care provider, drink red wine in moderation. This means 1 glass a day for nonpregnant women and 2 glasses a day for men. A glass of wine equals 5 oz (150 mL). Reading food labels   Check the serving size of packaged foods. For foods such as rice and pasta, the serving size refers to the amount of cooked product, not dry.  Check the total fat in packaged foods. Avoid foods that have saturated fat or trans fats.  Check the ingredients list for added sugars, such as corn  syrup. Shopping  At the grocery store, buy most of your food from the areas near the walls of the store. This includes: ? Fresh fruits and vegetables (produce). ? Grains, beans, nuts, and seeds. Some of these may be available in unpackaged forms or large amounts (in bulk). ? Fresh seafood. ? Poultry and eggs. ? Low-fat dairy products.  Buy whole ingredients instead of prepackaged foods.  Buy fresh fruits and vegetables in-season from local farmers  markets.  Buy frozen fruits and vegetables in resealable bags.  If you do not have access to quality fresh seafood, buy precooked frozen shrimp or canned fish, such as tuna, salmon, or sardines.  Buy small amounts of raw or cooked vegetables, salads, or olives from the deli or salad bar at your store.  Stock your pantry so you always have certain foods on hand, such as olive oil, canned tuna, canned tomatoes, rice, pasta, and beans. Cooking  Cook foods with extra-virgin olive oil instead of using butter or other vegetable oils.  Have meat as a side dish, and have vegetables or grains as your main dish. This means having meat in small portions or adding small amounts of meat to foods like pasta or stew.  Use beans or vegetables instead of meat in common dishes like chili or lasagna.  Experiment with different cooking methods. Try roasting or broiling vegetables instead of steaming or sauteing them.  Add frozen vegetables to soups, stews, pasta, or rice.  Add nuts or seeds for added healthy fat at each meal. You can add these to yogurt, salads, or vegetable dishes.  Marinate fish or vegetables using olive oil, lemon juice, garlic, and fresh herbs. Meal planning   Plan to eat 1 vegetarian meal one day each week. Try to work up to 2 vegetarian meals, if possible.  Eat seafood 2 or more times a week.  Have healthy snacks readily available, such as: ? Vegetable sticks with hummus. ? [place] yogurt. ? Fruit and nut trail mix.  Eat balanced meals throughout the week. This includes: ? Fruit: 2-3 servings a day ? Vegetables: 4-5 servings a day ? Low-fat dairy: 2 servings a day ? Fish, poultry, or lean meat: 1 serving a day ? Beans and legumes: 2 or more servings a week ? Nuts and seeds: 1-2 servings a day ? Whole grains: 6-8 servings a day ? Extra-virgin olive oil: 3-4 servings a day  Limit red meat and sweets to only a few servings a month What are my food  choices?  Mediterranean diet ? Recommended  Grains: Whole-grain pasta. Brown rice. Bulgar wheat. Polenta. Couscous. Whole-wheat bread. [place].  Vegetables: Artichokes. Beets. Broccoli. Cabbage. Carrots. Eggplant. Green beans. Chard. Kale. Spinach. Onions. Leeks. Peas. Squash. Tomatoes. Peppers. Radishes.  Fruits: Apples. Apricots. Avocado. Berries. Bananas. Cherries. Dates. Figs. Grapes. Lemons. Melon. Oranges. Peaches. Plums. Pomegranate.  Meats and other protein foods: Beans. Almonds. Sunflower seeds. Pine nuts. Peanuts. [place]. Salmon. Scallops. Shrimp. [place]. Tilapia. Clams. Oysters. Eggs.  Dairy: Low-fat milk. Cheese. Greek yogurt.  Beverages: Water. Red wine. Herbal tea.  Fats and oils: Extra virgin olive oil. Avocado oil. Grape seed oil.  Sweets and desserts: [place] yogurt with honey. Baked apples. Poached pears. Trail mix.  Seasoning and other foods: Basil. Cilantro. Coriander. Cumin. Mint. Parsley. Sage. Rosemary. Tarragon. Garlic. Oregano. Thyme. Pepper. Balsalmic vinegar. Tahini. Hummus. Tomato sauce. Olives. Mushrooms. ? Limit these  Grains: Prepackaged pasta or rice dishes. Prepackaged cereal with added sugar.  Vegetables: Deep fried potatoes (french fries).  Fruits: Fruit canned in syrup.  Meats and other protein foods: Beef. Pork. Lamb. Poultry with skin. Hot dogs. [place] Salines.  Dairy: Ice cream. Sour cream. Whole milk.  Beverages: Juice. Sugar-sweetened soft drinks. Beer. Liquor and spirits.  Fats and oils: Butter. Canola oil. Vegetable oil. Beef fat (tallow). Lard.  Sweets and desserts: Cookies. Cakes. Pies. Candy.  Seasoning and other foods: Mayonnaise. Premade sauces and marinades. The items listed may not be a complete list. Talk with your dietitian about what dietary choices are right for you. Summary  The Mediterranean diet includes both food and lifestyle choices.  Eat a variety of fresh fruits and vegetables, beans, nuts, seeds, and whole  grains.  Limit the amount of red meat and sweets that you eat.  Talk with your health care provider about whether it is safe for you to drink red wine in moderation. This means 1 glass a day for nonpregnant women and 2 glasses a day for men. A glass of wine equals 5 oz (150 mL). This information is not intended to replace advice given to you by your health care provider. Make sure you discuss any questions you have with your health care provider. Document Revised: 10/09/2015 Document Reviewed: 10/02/2015 Elsevier Patient Education  [place].

## 2020-01-03 NOTE — Chronic Care Management (AMB) (Signed)
Chronic Care Management Pharmacy  Name: Dennis Ayers  MRN: [id number] DOB: [birth year]   Chief Complaint/ HPI  Dennis Ayers,  69 y.o., male presents for their Follow-Up CCM visit with the clinical pharmacist via telephone.  PCP : Dennis Bush, MD   Their chronic conditions include: hypertension, sleep apnea, coronary artery calcification, type 2 DM, hyperlipidemia, osteoarthritis, obesity, ED  Patient concerns: Denies any problems with his medications. Reports switched Advil to Tylenol in past year, confirms change in atorvastatin dose from 40 mg to 20 mg daily   Office Visits:  10/05/19: Dennis Ayers - [facility], continue current medication, CGM self pay, s/p left TKA limits activity   04/06/19: Dennis Ayers - DM controlled, foot exam today, rtc 5 months  10/02/18: [facility] - cont current meds  Consult Visit:   12/19/19: Cardiology - reports doing well, cut back on salt, remains active walking 1-2 miles per day, weight up 10 lbs, using libre sensor for glucose monitoring,   Allergies  Allergen Reactions  . Lisinopril Other (See Comments)    cough   Medications: Outpatient Encounter Medications as of 01/03/2020  Medication Sig  . aspirin EC 81 MG tablet Take 81 mg by mouth daily.  Marland Kitchen atorvastatin (LIPITOR) 20 MG tablet Take 1 tablet (20 mg total) by mouth daily at 6 PM.  . Continuous Blood Gluc Sensor (FREESTYLE LIBRE 14 DAY SENSOR) MISC 1 Units by Does not apply route every 14 (fourteen) days.  . DiphenhydrAMINE HCl, Sleep, (UNISOM SLEEPMELTS) 25 MG TBDP Take 25 mg by mouth at bedtime.   Marland Kitchen glucose blood (ONE TOUCH ULTRA TEST) test strip USE AS INSTRUCTED TO CHECK BLOOD SUGAR ONCE A DAY AS NEEDED. DX CODE: E11.9  . ibuprofen (ADVIL,MOTRIN) 200 MG tablet Take 600 mg by mouth daily as needed for moderate pain.  Dennis Ayers Oil 300 MG CAPS Take 300 mg by mouth daily.  Marland Kitchen losartan (COZAAR) 50 MG tablet Take 1 tablet (50 mg total) by mouth daily.  . metFORMIN (GLUCOPHAGE) 500 MG tablet TAKE  1 TABLET BY MOUTH EVERY DAY WITH BREAKFAST  . Misc Natural Products (OSTEO BI-FLEX JOINT SHIELD PO) Take 1 capsule by mouth daily.   . Multiple Vitamin (MULTIVITAMIN) tablet Take 1 tablet by mouth daily.  Dennis Ayers Delica Lancets 95A MISC Use as directed to check blood sugar once daily as needed.  Dx code: E11.9   No facility-administered encounter medications on file as of 01/03/2020.   Current Diagnosis/Assessment:   Emergency planning/management officer Strain: Low Risk   . Difficulty of Paying Living Expenses: Not hard at all   Goals Addressed            This Visit's Progress   . Pharmacy Care Plan       CARE PLAN ENTRY  Current Barriers:  . Chronic Disease Management support, education, and care coordination needs related to Hypertension and Diabetes   Hypertension . Pharmacist Clinical Goal(s): o Over the next 6 months, patient will work with PharmD and providers to maintain BP goal <130/80 mmHg . Current regimen:  o Losartan 50 mg - 1 tablet daily . Interventions: o Recommend monitoring BP at least once monthly  . Patient self care activities - Over the next 6 months, patient will: o Check blood pressure monthly, document, and provide at future appointments o Ensure daily salt intake < 2300 mg/day o Continue exercising with goal of 30 minutes, 5 days per week   Diabetes . Pharmacist Clinical Goal(s): o Over the next 6 months, patient  will work with PharmD and providers to maintain A1c goal <7% . Current regimen:  o Metformin 500 mg - 1 tablet daily with breakfast . Interventions: o Continue current medications . Patient self care activities - Over the next 6 months, patient will: o Continue to monitor blood glucose with freestyle libre o Adhere to heart healthy diet low in carbohydrates such as the diabetic plate method  o Contact provider with any episodes of hypoglycemia (blood glucose < 70 mg/dL)  Additional recommendations: - Appropriate over the counter medication use:  Limit use of Unisom for sleep due to increased risk of side effects in adults > 65 including dizziness, dry mouth, cognitive impairment, and constipation. Try reducing use to 1/2 tablet daily for 2 weeks, then every other day for 2 weeks, then use as needed.   Please see past updates related to this goal by clicking on the "Past Updates" button in the selected goal       01/03/20 Adherence reviewed: < 5 day gap on ACEI, statin, and metformin  Hypertension   CMP Latest Ref Rng & Units 10/02/2019 09/28/2018 03/16/2018  Glucose 70 - 99 mg/dL 157(H) 143(H) -  BUN 6 - 23 mg/dL 23 21 -  Creatinine 0.40 - 1.50 mg/dL 1.03 0.93 -  Sodium 135 - 145 mEq/L 141 140 -  Potassium 3.5 - 5.1 mEq/L 4.7 4.3 -  Chloride 96 - 112 mEq/L 103 104 -  CO2 19 - 32 mEq/L 30 27 -  Calcium 8.4 - 10.5 mg/dL 9.1 9.4 -  Total Protein 6.0 - 8.3 g/dL 6.6 6.9 -  Total Bilirubin 0.2 - 1.2 mg/dL 0.8 0.8 -  Alkaline Phos 39 - 117 U/L 55 67 -  AST 0 - 37 U/L 15 17 -  ALT 0 - 53 U/L 25 28 24    Office blood pressures are: BP Readings from Last 3 Encounters:  12/19/19 120/70  10/05/19 140/78  04/06/19 124/76   BP goal < 130/80 mmHg Patient has failed these meds in the past: none Patient checks BP at home infrequently (monthly) Patient home BP readings are ranging: none reported  Risk factors - Sleep apnea, not on CPAP Patient is currently controlled on the following medications:   Losartan 50 mg - 1 tablet daily  Update 01/03/20:  Exercise - not exercising as much as he would like due to not getting out as much as he used to/knee pain; still able to walk but weather is limiting, walking around yard gardening (average 1.2 miles/day on iPhone) Diet - eating out more Recommended weight loss. Pt denies weight loss goals. Reports difficulty with weight loss in the past. Encouraged setting SMART goals. Encouraged weighing regularly and working towards one health goal. Reviewed chart for sleep apnea history - OSA  (obstructive sleep apnea)  mild to mod, working on weight loss, no cpap needed per test  Plan: Continue current medications; Recommend weight loss for CV risk reduction.  Hyperlipidemia   Lipid Panel     Component Value Date/Time   CHOL 99 10/02/2019 0736   TRIG 43.0 10/02/2019 0736   HDL 53.50 10/02/2019 0736   CHOLHDL 2 10/02/2019 0736   VLDL 8.6 10/02/2019 0736   LDLCALC 37 10/02/2019 0736    CBC Latest Ref Rng & Units 10/02/2019 09/28/2018 09/09/2017  WBC 4.0 - 10.5 K/uL 5.1 5.3 4.9  Hemoglobin 13.0 - 17.0 g/dL 15.7 16.4 16.6  Hematocrit 39 - 52 % 45.2 47.9 47.9  Platelets 150 - 400 K/uL 201.0 213.0 226.0  LDL goal < 70 Patient has failed these meds in past: none reported Patient is currently controlled on the following medications:   Atorvastatin 20 mg - 1 tablet daily  Krill oil 300 mg - 1 cap daily   Aspirin 81 mg - 1 tablet daily  Update 01/03/20: Atorvastatin reduced to 20 mg per cardiology 12/19/19, labs will be updated at [facility]; CBC stable on aspirin    Plan: Continue current medications  Diabetes   Recent Relevant Labs: Lab Results  Component Value Date/Time   HGBA1C 6.4 10/02/2019 07:36 AM   HGBA1C 6.0 (A) 04/06/2019 07:24 AM   HGBA1C 5.9 09/28/2018 07:40 AM   MICROALBUR 1.0 03/27/2018 08:54 AM   MICROALBUR 0.8 03/04/2017 08:00 AM    Checking BG: Freestyle Libre (Discount through insurance - pays cash)  A1c goal < 7% Patient has failed these meds in past: none  Patient is currently controlled on the following medications:   Metformin 500 mg - 1 tablet daily with breakfast  Last diabetic eye exam:  Lab Results  Component Value Date/Time   HMDIABEYEEXA No Retinopathy 07/13/2019 12:00 AM    Last diabetic foot exam: 04/06/19 (PCP)  Update 01/03/20: noticed BG increasing between 12-2 AM  Average lasts 24 hours - 137 Highest average last 9 months - 162 Lows - denies < 70 Patient will send recent readings by MyChart Reports BG has been high but  thinks it was a bad sensor (Libre was 50 points higher than fingerstick) Ordered test stips so he can double check accuracy of Libre  Plan: Continue current medications; Recommend increasing exercise and weight loss.   Osteoarthritis   Patient has failed these meds in past: none  Patient is currently controlled on the following medications:   Tylenol 500 mg - 1 tablet q4-6hr PRN  We discussed: usually takes Tylenol 1-2 times per month, no longer taking Advil  Update 01/03/20: still avoiding NSAIDs as directed  Plan: Continue current medications  Vaccines   Reviewed and discussed patient's vaccination history. Vaccines up to date.  Immunization History  Administered Date(s) Administered  . Hepatitis B, adult 03/11/2017, 06/14/2017, 10/18/2017  . Influenza Whole 11/23/2011  . Influenza, High Dose Seasonal PF 11/03/2014, 11/21/2015, 10/01/2016, 10/20/2018, 11/01/2019, 11/02/2019  . Influenza-Unspecified 09/30/2017  . PFIZER SARS-COV-2 Vaccination 04/02/2019, 04/25/2019, 11/22/2019  . Pneumococcal Conjugate-13 07/10/2015  . Pneumococcal Polysaccharide-23 11/21/2015  . Td 03/23/2002  . Tdap 07/10/2015  . Zoster 07/23/2011  . Zoster Recombinat (Shingrix) 11/18/2017, 02/22/2018   Plan: Ensure vaccines remain up to date  Medication Management  OTCs: multivitamin, osteo bi-flex joint shield OTC, (Unisom) diphenhydramine 25 mg - 1 qhs PRN sleep (denies any changes to OTCs 01/03/20)  Pharmacy/Benefits: [facility] + Mail order  Affordability: no concerns  CCM Follow Up: 6 months (telephone)  Debbora Dus, PharmD Clinical Pharmacist [facility] [phone]

## 2020-01-03 NOTE — Progress Notes (Signed)
I have collaborated with the care management provider regarding care management and care coordination activities outlined in this encounter and have reviewed this encounter including documentation in the note and care plan. I am certifying that I agree with the content of this note and encounter as supervising physician.  

## 2020-01-05 ENCOUNTER — Encounter: Payer: Self-pay | Admitting: Family Medicine

## 2020-01-07 ENCOUNTER — Encounter: Payer: Self-pay | Admitting: Gastroenterology

## 2020-01-07 ENCOUNTER — Ambulatory Visit (AMBULATORY_SURGERY_CENTER): Payer: Self-pay | Admitting: *Deleted

## 2020-01-07 ENCOUNTER — Other Ambulatory Visit: Payer: Self-pay

## 2020-01-07 VITALS — Ht 74.5 in | Wt 335.0 lb

## 2020-01-07 DIAGNOSIS — Z86010 Personal history of colonic polyps: Secondary | ICD-10-CM

## 2020-01-07 MED ORDER — SUTAB 1479-225-188 MG PO TABS: 1.0000 | ORAL_TABLET | ORAL | 0 refills | Status: DC

## 2020-01-07 NOTE — Progress Notes (Signed)
Patient is here in-person for PV. Patient denies any allergies to eggs or soy. Patient denies any problems with anesthesia/sedation. Patient denies any oxygen use at home. Patient denies taking any diet/weight loss medications or blood thinners. Patient is not being treated for MRSA or C-diff. Patient is aware of our care-partner policy and [id number] safety protocol.   COVID-19 vaccines completed on 11/22/19 booster, per patient.   Prep Prescription coupon given to the patient.

## 2020-01-21 ENCOUNTER — Ambulatory Visit (AMBULATORY_SURGERY_CENTER): Payer: Medicare Other | Admitting: Gastroenterology

## 2020-01-21 ENCOUNTER — Encounter: Payer: Self-pay | Admitting: Gastroenterology

## 2020-01-21 ENCOUNTER — Other Ambulatory Visit: Payer: Self-pay

## 2020-01-21 VITALS — BP 108/69 | HR 81 | Temp 98.9°F | Resp 28 | Wt 335.0 lb

## 2020-01-21 DIAGNOSIS — D122 Benign neoplasm of ascending colon: Secondary | ICD-10-CM

## 2020-01-21 DIAGNOSIS — Z1211 Encounter for screening for malignant neoplasm of colon: Secondary | ICD-10-CM | POA: Diagnosis not present

## 2020-01-21 DIAGNOSIS — D123 Benign neoplasm of transverse colon: Secondary | ICD-10-CM

## 2020-01-21 DIAGNOSIS — D120 Benign neoplasm of cecum: Secondary | ICD-10-CM

## 2020-01-21 DIAGNOSIS — Z86010 Personal history of colonic polyps: Secondary | ICD-10-CM

## 2020-01-21 MED ORDER — SODIUM CHLORIDE 0.9 % IV SOLN: 500.0000 mL | Freq: Once | INTRAVENOUS | Status: DC

## 2020-01-21 NOTE — Progress Notes (Signed)
Pt's states no medical or surgical changes since previsit or office visit. 

## 2020-01-21 NOTE — Patient Instructions (Signed)
Impression/Recommendations:  Polyp, Diverticulosis, and hemorrhoid handouts given to patient.  Resume previous diet. Continue present medicatons. Await pathology results.  YOU HAD AN ENDOSCOPIC PROCEDURE TODAY AT [facility]:   Refer to the procedure report that was given to you for any specific questions about what was found during the examination.  If the procedure report does not answer your questions, please call your gastroenterologist to clarify.  If you requested that your care partner not be given the details of your procedure findings, then the procedure report has been included in a sealed envelope for you to review at your convenience later.  YOU SHOULD EXPECT: Some feelings of bloating in the abdomen. Passage of more gas than usual.  Walking can help get rid of the air that was put into your GI tract during the procedure and reduce the bloating. If you had a lower endoscopy (such as a colonoscopy or flexible sigmoidoscopy) you may notice spotting of blood in your stool or on the toilet paper. If you underwent a bowel prep for your procedure, you may not have a normal bowel movement for a few days.  Please Note:  You might notice some irritation and congestion in your nose or some drainage.  This is from the oxygen used during your procedure.  There is no need for concern and it should clear up in a day or so.  SYMPTOMS TO REPORT IMMEDIATELY:   Following lower endoscopy (colonoscopy or flexible sigmoidoscopy):  Excessive amounts of blood in the stool  Significant tenderness or worsening of abdominal pains  Swelling of the abdomen that is new, acute  Fever of 100F or higher For urgent or emergent issues, a gastroenterologist can be reached at any hour by calling [phone]. Do not use MyChart messaging for urgent concerns.    DIET:  We do recommend a small meal at first, but then you may proceed to your regular diet.  Drink plenty of fluids but you should  avoid alcoholic beverages for 24 hours.  ACTIVITY:  You should plan to take it easy for the rest of today and you should NOT DRIVE or use heavy machinery until tomorrow (because of the sedation medicines used during the test).    FOLLOW UP: Our staff will call the number listed on your records 48-72 hours following your procedure to check on you and address any questions or concerns that you may have regarding the information given to you following your procedure. If we do not reach you, we will leave a message.  We will attempt to reach you two times.  During this call, we will ask if you have developed any symptoms of COVID 19. If you develop any symptoms (ie: fever, flu-like symptoms, shortness of breath, cough etc.) before then, please call [phone].  If you test positive for Covid 19 in the 2 weeks post procedure, please call and report this information to [place].    If any biopsies were taken you will be contacted by phone or by letter within the next 1-3 weeks.  Please call us at [phone] if you have not heard about the biopsies in 3 weeks.    SIGNATURES/CONFIDENTIALITY: You and/or your care partner have signed paperwork which will be entered into your electronic medical record.  These signatures attest to the fact that that the information above on your After Visit Summary has been reviewed and is understood.  Full responsibility of the confidentiality of this discharge information lies with you and/or your  care-partner.

## 2020-01-21 NOTE — Progress Notes (Signed)
Called to room to assist during endoscopic procedure.  Patient ID and intended procedure confirmed with present staff. Received instructions for my participation in the procedure from the performing physician.  

## 2020-01-21 NOTE — Progress Notes (Signed)
VS taken by C.W. 

## 2020-01-21 NOTE — Progress Notes (Signed)
This pt has severe OSA and at times required significant assist by CRNA to maintain a patent airway.  I would suggest his next procedure be performed in the hospital setting.

## 2020-01-21 NOTE — Op Note (Signed)
[facility] Patient Name: Dennis Ayers Procedure Date: 01/21/2020 8:24 AM MRN: [id number] Endoscopist: Remo Lipps P. Havery Moros , MD Age: 69 Referring MD:  Date of Birth: 05-03-1950 Gender: Male Account #: [id number] Procedure:                Colonoscopy Indications:              High risk colon cancer surveillance: Personal                            history of colonic polyps - numerous polyps removed                            in the past (see record for details - > 22,                            negative genetic testing, history of EMR in the                            cecum). Patient of Dr. Carlean Purl, has required                            abdominal binder / pressure to achieve cecal                            intubation in the past Medicines:                Monitored Anesthesia Care Procedure:                Pre-Anesthesia Assessment:                           - Prior to the procedure, a History and Physical                            was performed, and patient medications and                            allergies were reviewed. The patient's tolerance of                            previous anesthesia was also reviewed. The risks                            and benefits of the procedure and the sedation                            options and risks were discussed with the patient.                            All questions were answered, and informed consent                            was obtained. Prior Anticoagulants: The patient has  taken no previous anticoagulant or antiplatelet                            agents. ASA Grade Assessment: III - A patient with                            severe systemic disease. After reviewing the risks                            and benefits, the patient was deemed in                            satisfactory condition to undergo the procedure.                           After obtaining informed consent, the colonoscope                             was passed under direct vision. Throughout the                            procedure, the patient's blood pressure, pulse, and                            oxygen saturations were monitored continuously. The                            Colonoscope was introduced through the anus and                            advanced to the the cecum, identified by                            appendiceal orifice and ileocecal valve. The                            colonoscopy was performed without difficulty. The                            patient tolerated the procedure well. The quality                            of the bowel preparation was good. The ileocecal                            valve, appendiceal orifice, and rectum were                            photographed. Scope In: 8:32:18 AM Scope Out: 9:14:48 AM Scope Withdrawal Time: 0 hours 26 minutes 8 seconds  Total Procedure Duration: 0 hours 42 minutes 30 seconds  Findings:                 The perianal and digital rectal examinations were  normal.                           A 3 mm polyp was found in the cecum. The polyp was                            sessile. The polyp was removed with a cold snare.                            Resection and retrieval were complete.                           A 3 mm polyp was found in the ileocecal valve. The                            polyp was sessile. The polyp was removed with a                            cold snare. Resection and retrieval were complete.                           Five flat and sessile polyps were found in the                            ascending colon. The polyps were 3 to 10 mm in                            size. These polyps were removed with a cold snare.                            Resection and retrieval were complete.                           Five sessile polyps were found in the transverse                            colon. The polyps were 3  to 8 mm in size. These                            polyps were removed with a cold snare. Resection                            and retrieval were complete.                           A 3 mm polyp was found in the splenic flexure. The                            polyp was sessile. The polyp was removed with a                            cold snare. Resection and retrieval were complete.  Scattered medium-mouthed diverticula were found in                            the entire colon.                           The colon revealed excessive looping despite                            placement of abdominal binder. Additional pressure                            was applied to the patient's abdomen to achieve                            cecal intubation which was challenging.                           Internal hemorrhoids were found during retroflexion.                           The exam was otherwise without abnormality. Complications:            No immediate complications. Estimated blood loss:                            Minimal. Patient was difficult to sedate per                            anesthesia, coughing, etc, Estimated Blood Loss:     Estimated blood loss was minimal. Impression:               - One 3 mm polyp in the cecum, removed with a cold                            snare. Resected and retrieved.                           - One 3 mm polyp at the ileocecal valve, removed                            with a cold snare. Resected and retrieved.                           - Five 3 to 10 mm polyps in the ascending colon,                            removed with a cold snare. Resected and retrieved.                           - Five 3 to 8 mm polyps in the transverse colon,                            removed with a cold snare. Resected and retrieved.                           -  One 3 mm polyp at the splenic flexure, removed                            with a cold snare.  Resected and retrieved.                           - Diverticulosis in the entire examined colon.                           - There was significant looping of the colon,                            managed as above                           - Internal hemorrhoids.                           - The examination was otherwise normal. Recommendation:           - Patient has a contact number available for                            emergencies. The signs and symptoms of potential                            delayed complications were discussed with the                            patient. Return to normal activities tomorrow.                            Written discharge instructions were provided to the                            patient.                           - Resume previous diet.                           - Continue present medications.                           - Await pathology results.                           - Per anesthesia service, they would prefer future                            colonoscopy exams for this patient be done at the                            hospital for full anesthesia support if needed Nemiah Kissner P. Havery Moros, MD 01/21/2020 9:22:26 AM This report has been signed electronically.

## 2020-01-21 NOTE — Progress Notes (Signed)
A/ox3, pleased with MAC, report to RN 

## 2020-01-23 ENCOUNTER — Telehealth: Payer: Self-pay | Admitting: *Deleted

## 2020-01-23 ENCOUNTER — Telehealth: Payer: Self-pay

## 2020-01-23 NOTE — Telephone Encounter (Signed)
Patient questions:  Do you have a fever, pain , or abdominal swelling? No. Pain Score  0 *  Have you tolerated food without any problems? Yes.    Have you been able to return to your normal activities? Yes.    Do you have any questions about your discharge instructions: Diet   No. Medications  No. Follow up visit  No.  Do you have questions or concerns about your Care? No.  Actions: * If pain score is 4 or above: No action needed, pain <4.  1. Have you developed a fever since your procedure? No  2.   Have you had an respiratory symptoms (SOB or cough) since your procedure? No  3.   Have you tested positive for COVID 19 since your procedure No  4.   Have you had any family members/close contacts diagnosed with the COVID 19 since your procedure?  No  If yes to any of these questions please route to Joylene John, RN and Joella Prince, RN

## 2020-01-23 NOTE — Telephone Encounter (Signed)
Left message on f/u call 

## 2020-02-14 ENCOUNTER — Encounter: Payer: Self-pay | Admitting: Family Medicine

## 2020-02-15 MED ORDER — LOSARTAN POTASSIUM 100 MG PO TABS: 50.0000 mg | ORAL_TABLET | Freq: Every day | ORAL | 3 refills | Status: DC

## 2020-02-27 ENCOUNTER — Telehealth: Payer: Self-pay

## 2020-02-27 NOTE — Chronic Care Management (AMB) (Addendum)
  Chronic Care Management Pharmacy Assistant   Name: Dennis Ayers  MRN: [id number] DOB: 04/30/1950  Reason for Encounter: Disease State  Patient Questions:  1.  Have you seen any other providers since your last visit? Yes 01/21/20 Procedure visit- Dr. Steven Armbruster- Gastroenterology   2.  Any changes in your medicines or health? No  PCP : Gutierrez, Javier, MD  Allergies:   Allergies  Allergen Reactions   Lisinopril Other (See Comments)    cough    Medications: Outpatient Encounter Medications as of 02/27/2020  Medication Sig   acetaminophen (TYLENOL) 500 MG tablet Take 1,000 mg by mouth every 6 (six) hours as needed.   aspirin EC 81 MG tablet Take 81 mg by mouth daily.   atorvastatin (LIPITOR) 20 MG tablet Take 1 tablet (20 mg total) by mouth daily at 6 PM.   Continuous Blood Gluc Sensor (FREESTYLE LIBRE 14 DAY SENSOR) MISC 1 Units by Does not apply route every 14 (fourteen) days.   DiphenhydrAMINE HCl, Sleep, (UNISOM SLEEPMELTS) 25 MG TBDP Take 25 mg by mouth at bedtime.    glucose blood (ONE TOUCH ULTRA TEST) test strip USE AS INSTRUCTED TO CHECK BLOOD SUGAR ONCE A DAY AS NEEDED. DX CODE: E11.9   ibuprofen (ADVIL,MOTRIN) 200 MG tablet Take 600 mg by mouth daily as needed for moderate pain.   Krill Oil 300 MG CAPS Take 300 mg by mouth daily.   losartan (COZAAR) 100 MG tablet Take 0.5 tablets (50 mg total) by mouth daily.   losartan (COZAAR) 50 MG tablet Take 1 tablet (50 mg total) by mouth daily.   metFORMIN (GLUCOPHAGE) 500 MG tablet TAKE 1 TABLET BY MOUTH EVERY DAY WITH BREAKFAST   Misc Natural Products (OSTEO BI-FLEX JOINT SHIELD PO) Take 1 capsule by mouth daily.    Multiple Vitamin (MULTIVITAMIN) tablet Take 1 tablet by mouth daily.   OneTouch Delica Lancets 33G MISC Use as directed to check blood sugar once daily as needed.  Dx code: E11.9   Sodium Sulfate-Mag Sulfate-KCl (SUTAB) 1479-225-188 MG TABS Take 1 kit by mouth as directed. MANUFACTURER CODES!! BIN:  [id number] PCN: CN GROUP: [id number] MEMBER ID: [id number];RUN AS SECONDARY INSURANCE ;NO PRIOR AUTHORIZATION   No facility-administered encounter medications on file as of 02/27/2020.    Current Diagnosis: Patient Active Problem List   Diagnosis Date Noted   Hyperlipidemia associated with type 2 diabetes mellitus (HCC) - goal LDL <70 09/16/2018   Genetic testing 01/11/2018   Family history of ovarian cancer    Redundant colon 11/16/2017   Health maintenance examination 09/10/2016   Advanced care planning/counseling discussion 09/10/2016   Coronary artery calcification 08/18/2016   Dilated aortic root (HCC) 08/18/2016   Controlled diabetes mellitus type 2 with complications (HCC) 07/09/2016   Family history of coronary artery disease 05/20/2016   Essential hypertension 05/20/2016   Erectile dysfunction 07/10/2015   Polycythemia 07/05/2012   OSA (obstructive sleep apnea)    Personal history of colonic polyps-serrated adenomas and tubular adenoma 08/20/2011   Knee osteoarthritis 07/23/2011   Nodule of finger, left 07/23/2011   ROTATOR CUFF SYNDROME, RIGHT 08/02/2008   Obesity, morbid, BMI 40.0-49.9 (HCC) 02/15/2008   NEVI, MULTIPLE 05/23/2007   Lab Results  Component Value Date/Time   HGBA1C 6.4 10/02/2019 07:36 AM   HGBA1C 6.0 (A) 04/06/2019 07:24 AM   HGBA1C 5.9 09/28/2018 07:40 AM    Reviewed chart prior to disease state call. Spoke with patient regarding BP  Recent Office Vitals: BP Readings from Last 3   Encounters:  01/21/20 108/69  12/19/19 120/70  10/05/19 140/78   Pulse Readings from Last 3 Encounters:  01/21/20 81  12/19/19 86  10/05/19 79    Wt Readings from Last 3 Encounters:  01/21/20 (!) 335 lb (152 kg)  01/07/20 (!) 335 lb (152 kg)  12/19/19 (!) 333 lb 8 oz (151.3 kg)     Kidney Function Lab Results  Component Value Date/Time   CREATININE 1.03 10/02/2019 07:36 AM   CREATININE 0.93 09/28/2018 07:40 AM   GFR 71.41 10/02/2019 07:36 AM   GFRNONAA >60  06/02/2016 07:39 AM   GFRAA >60 06/02/2016 07:39 AM    BMP Latest Ref Rng & Units 10/02/2019 09/28/2018 09/09/2017  Glucose 70 - 99 mg/dL 157(H) 143(H) 109(H)  BUN 6 - 23 mg/dL 23 21 25(H)  Creatinine 0.40 - 1.50 mg/dL 1.03 0.93 0.96  BUN/Creat Ratio 10 - 24 - - -  Sodium 135 - 145 mEq/L 141 140 140  Potassium 3.5 - 5.1 mEq/L 4.7 4.3 4.3  Chloride 96 - 112 mEq/L 103 104 105  CO2 19 - 32 mEq/L _0 Calcium 8.4 - 10.5 mg/dL 9.1 9.4 9.3    Current antihypertensive regimen:  Losartan 50 mg - 1 tablet daily  How often are you checking your Blood Pressure? infrequently about once a month  Current home BP readings: No readings available at this time. States that he does not recall blood pressure being high last time he checked.   What recent interventions/DTPs have been made by any provider to improve Blood Pressure control since last CPP Visit:  Check and document blood pressure at least once a month. Exercise about 30 minutes a day 5 days a week and limit salt intake to less than 2300 mg/day  Any recent hospitalizations or ED visits since last visit with CPP? No   What diet changes have been made to improve Blood Pressure Control?  None. States due to rising food costs he is unable to buy as many healthy items.   What exercise is being done to improve your Blood Pressure Control?  No formal exercise. States he is staying home mostly due to covid.  Adherence Review: Is the patient currently on ACE/ARB medication? Yes Does the patient have >5 day gap between last estimated fill dates? CPP to review - refills timely   Since last visit with Debbora Dus, no interventions have been made.  The patient has not had an ED visit since their last CPP follow up. The patient's current Chronic and PDC medications are: Atorvastatin 20 mg 1 daily, losartan 100 mg 1/2 tablet daily due to [employer] being out of  50 mg losartan.  CPP to review medications for any greater than 5 day gap between last  fill.The patient denies any problems with his health. The patient has had the following problems with their pharmacy: [place] unable to fill the 50 mg losartan. Had to have PCP send in 100 mg to cut in half. The patient has not had any side effects with their medicines.The patient has no recommendations for improvements in managing care.                              Follow-Up:  Pharmacist Review   Debbora Dus, CPP notified  Margaretmary Dys, [place] Assistant [phone]  Reviewed adherence, refills timely. Noted patient obesity and reviewed medications for weight gain/weight loss potential. Will discuss weight at  follow up and see if patient would be interested in weight loss program or medication management such as Ozempic. I have reviewed the care management and care coordination activities outlined in this encounter and I am certifying that I agree with the content of this note. No further action required.  Michelle Adams, PharmD Clinical Pharmacist [facility] [phone]   

## 2020-04-07 ENCOUNTER — Other Ambulatory Visit: Payer: Self-pay

## 2020-04-07 ENCOUNTER — Ambulatory Visit (INDEPENDENT_AMBULATORY_CARE_PROVIDER_SITE_OTHER): Payer: Medicare Other | Admitting: Family Medicine

## 2020-04-07 ENCOUNTER — Encounter: Payer: Self-pay | Admitting: Family Medicine

## 2020-04-07 VITALS — BP 140/80 | HR 87 | Temp 97.8°F | Ht 74.5 in | Wt 334.2 lb

## 2020-04-07 DIAGNOSIS — E118 Type 2 diabetes mellitus with unspecified complications: Secondary | ICD-10-CM

## 2020-04-07 DIAGNOSIS — I10 Essential (primary) hypertension: Secondary | ICD-10-CM | POA: Diagnosis not present

## 2020-04-07 DIAGNOSIS — Z860100 Personal history of colon polyps, unspecified: Secondary | ICD-10-CM

## 2020-04-07 LAB — POCT GLYCOSYLATED HEMOGLOBIN (HGB A1C): Hemoglobin A1C: 6.4 % — AB (ref 4.0–5.6)

## 2020-04-07 NOTE — Assessment & Plan Note (Addendum)
Chronic, stable. Congratulated on good control to date. Continue current regimen and CGM use.

## 2020-04-07 NOTE — Assessment & Plan Note (Signed)
Having more frequent colonoscopies due to # of polyps

## 2020-04-07 NOTE — Assessment & Plan Note (Signed)
Encouraged regular walking routine.

## 2020-04-07 NOTE — Assessment & Plan Note (Signed)
Chronic, adequate. Continue losartan 50mg  daily.

## 2020-04-07 NOTE — Progress Notes (Signed)
Patient ID: Dennis Ayers, male    DOB: 07-10-1950, 70 y.o.   MRN: [id number]  This visit was conducted in person.  BP 140/80   Pulse 87   Temp 97.8 F (36.6 C) (Temporal)   Ht 6' 2.5" (1.892 m)   Wt (!) 334 lb 4 oz (151.6 kg)   SpO2 97%   BMI 42.34 kg/m    CC: 6 mo f/u visit  Subjective:   HPI: Dennis Ayers is a 70 y.o. male presenting on 04/07/2020 for Diabetes (Here for 6 mo f/u.)   DM - does regularly check sugars - 90d average 150, uses Freestyle Libre CGM with benefit. Compliant with antihyperglycemic regimen which includes: metformin 531m daily. More eating in. Denies low sugars or hypoglycemic symptoms. Denies paresthesias - mild hand numbness in sleep. Last diabetic eye exam 06/2019. Pneumovax: 2017. Prevnar: 2017. Glucometer brand: Freestyle Libre CGM. [facility] Continues aspirin, atorvastatin daily. No regular exercise. Enjoys watching grand daughter play softball. Limited walking.  Lab Results  Component Value Date   HGBA1C 6.4 (A) 04/07/2020   Diabetic Foot Exam - Simple   Simple Foot Form Diabetic Foot exam was performed with the following findings: Yes 04/07/2020  9:25 AM  Visual Inspection No deformities, no ulcerations, no other skin breakdown bilaterally: Yes Sensation Testing Intact to touch and monofilament testing bilaterally: Yes Pulse Check Posterior Tibialis and Dorsalis pulse intact bilaterally: Yes Comments    Lab Results  Component Value Date   MICROALBUR 1.0 03/27/2018   HTN - Compliant with current antihypertensive regimen of losartan 521mdaily.  Does not check blood pressures at home. No low blood pressure readings or symptoms of dizziness/syncope. Denies HA, vision changes, CP/tightness, SOB, leg swelling.      Relevant past medical, surgical, family and social history reviewed and updated as indicated. Interim medical history since our last visit reviewed. Allergies and medications reviewed and  updated. Outpatient Medications Prior to Visit  Medication Sig Dispense Refill  . acetaminophen (TYLENOL) 500 MG tablet Take 1,000 mg by mouth every 6 (six) hours as needed.    . Marland Kitchenspirin EC 81 MG tablet Take 81 mg by mouth daily.    . Marland Kitchentorvastatin (LIPITOR) 20 MG tablet Take 1 tablet (20 mg total) by mouth daily at 6 PM. 90 tablet 3  . Continuous Blood Gluc Sensor (FREESTYLE LIBRE 14 DAY SENSOR) MISC 1 Units by Does not apply route every 14 (fourteen) days. 6 each 3  . diphenhydrAMINE HCl, Sleep, 25 MG TBDP Take 25 mg by mouth at bedtime.     . Marland Kitchenlucose blood (ONE TOUCH ULTRA TEST) test strip USE AS INSTRUCTED TO CHECK BLOOD SUGAR ONCE A DAY AS NEEDED. DX CODE: E11.9 100 each 3  . Krill Oil 300 MG CAPS Take 300 mg by mouth daily.    . Marland Kitchenosartan (COZAAR) 100 MG tablet Take 0.5 tablets (50 mg total) by mouth daily. 45 tablet 3  . metFORMIN (GLUCOPHAGE) 500 MG tablet TAKE 1 TABLET BY MOUTH EVERY DAY WITH BREAKFAST 90 tablet 3  . Misc Natural Products (OSTEO BI-FLEX JOINT SHIELD PO) Take 1 capsule by mouth daily.     . Multiple Vitamin (MULTIVITAMIN) tablet Take 1 tablet by mouth daily.    . Glory Rosebushelica Lancets 3333OISC Use as directed to check blood sugar once daily as needed.  Dx code: E11.9 100 each 3  . Sodium Sulfate-Mag Sulfate-KCl (SUTAB) 14709-016-8952G TABS Take 1 kit by mouth as directed. MANUFACTURER  CODES!! BIN: [id number] PCN: CN GROUP: [id number] MEMBER ID: [id number];MBE AS SECONDARY INSURANCE ;NO PRIOR AUTHORIZATION 24 tablet 0  . ibuprofen (ADVIL,MOTRIN) 200 MG tablet Take 600 mg by mouth daily as needed for moderate pain.    Marland Kitchen losartan (COZAAR) 50 MG tablet Take 1 tablet (50 mg total) by mouth daily. (Patient not taking: Reported on 04/07/2020) 90 tablet 3   No facility-administered medications prior to visit.     Per HPI unless specifically indicated in ROS section below Review of Systems Objective:  BP 140/80   Pulse 87   Temp 97.8 F (36.6 C) (Temporal)   Ht 6' 2.5" (1.892  m)   Wt (!) 334 lb 4 oz (151.6 kg)   SpO2 97%   BMI 42.34 kg/m   Wt Readings from Last 3 Encounters:  04/07/20 (!) 334 lb 4 oz (151.6 kg)  01/21/20 (!) 335 lb (152 kg)  01/07/20 (!) 335 lb (152 kg)      Physical Exam Vitals and nursing note reviewed.  Constitutional:      General: He is not in acute distress.    Appearance: Normal appearance. He is well-developed and well-nourished. He is not ill-appearing.  HENT:     Mouth/Throat:     Mouth: Oropharynx is clear and moist.  Eyes:     General: No scleral icterus.    Extraocular Movements: Extraocular movements intact and EOM normal.     Conjunctiva/sclera: Conjunctivae normal.     Pupils: Pupils are equal, round, and reactive to light.  Cardiovascular:     Rate and Rhythm: Normal rate and regular rhythm.     Pulses: Normal pulses and intact distal pulses.     Heart sounds: Normal heart sounds. No murmur heard.   Pulmonary:     Effort: Pulmonary effort is normal. No respiratory distress.     Breath sounds: Normal breath sounds. No wheezing, rhonchi or rales.  Musculoskeletal:        General: No edema.     Right lower leg: No edema.     Left lower leg: No edema.     Comments: See HPI for foot exam if done  Skin:    General: Skin is warm and dry.     Findings: No rash.  Neurological:     Mental Status: He is alert.  Psychiatric:        Mood and Affect: Mood and affect and mood normal.        Behavior: Behavior normal.       Results for orders placed or performed in visit on 04/07/20  POCT glycosylated hemoglobin (Hb A1C)  Result Value Ref Range   Hemoglobin A1C 6.4 (A) 4.0 - 5.6 %   HbA1c POC (<> result, manual entry)     HbA1c, POC (prediabetic range)     HbA1c, POC (controlled diabetic range)     Assessment & Plan:  This visit occurred during the SARS-CoV-2 public health emergency.  Safety protocols were in place, including screening questions prior to the visit, additional usage of staff PPE, and extensive  cleaning of exam room while observing appropriate contact time as indicated for disinfecting solutions.   Problem List Items Addressed This Visit    Personal history of colonic polyps-serrated adenomas and tubular adenoma    Having more frequent colonoscopies due to # of polyps      Obesity, morbid, BMI 40.0-49.9 ([place])    Encouraged regular walking routine.       Essential hypertension  Chronic, adequate. Continue losartan 50m daily.       Controlled diabetes mellitus type 2 with complications (HCC) - Primary    Chronic, stable. Congratulated on good control to date. Continue current regimen and CGM use.       Relevant Orders   POCT glycosylated hemoglobin (Hb A1C) (Completed)       No orders of the defined types were placed in this encounter.  Orders Placed This Encounter  Procedures  . POCT glycosylated hemoglobin (Hb A1C)    Patient Instructions  Good to see you today Continue current medicines.  Return as needed or in 6 months for physical/wellness visit.    Follow up plan: Return in about 6 months (around 10/05/2020), or if symptoms worsen or fail to improve.  JRia Bush MD

## 2020-04-07 NOTE — Patient Instructions (Signed)
Good to see you today Continue current medicines.  Return as needed or in 6 months for physical/wellness visit.

## 2020-05-23 ENCOUNTER — Encounter: Payer: Self-pay | Admitting: Family Medicine

## 2020-05-23 NOTE — Telephone Encounter (Signed)
Updated pt's chart.  

## 2020-06-25 ENCOUNTER — Telehealth: Payer: Self-pay

## 2020-06-25 NOTE — Chronic Care Management (AMB) (Addendum)
    Chronic Care Management Pharmacy Assistant   Name: Dennis Ayers  MRN: [id number] DOB: 1950/05/26   Reason for Encounter: Reminder for CCM Appointment on 07/02/20   Medications: Outpatient Encounter Medications as of 06/25/2020  Medication Sig   acetaminophen (TYLENOL) 500 MG tablet Take 1,000 mg by mouth every 6 (six) hours as needed.   aspirin EC 81 MG tablet Take 81 mg by mouth daily.   atorvastatin (LIPITOR) 20 MG tablet Take 1 tablet (20 mg total) by mouth daily at 6 PM.   Continuous Blood Gluc Sensor (FREESTYLE LIBRE 14 DAY SENSOR) MISC 1 Units by Does not apply route every 14 (fourteen) days.   diphenhydrAMINE HCl, Sleep, 25 MG TBDP Take 25 mg by mouth at bedtime.    glucose blood (ONE TOUCH ULTRA TEST) test strip USE AS INSTRUCTED TO CHECK BLOOD SUGAR ONCE A DAY AS NEEDED. DX CODE: E11.9   Krill Oil 300 MG CAPS Take 300 mg by mouth daily.   losartan (COZAAR) 100 MG tablet Take 0.5 tablets (50 mg total) by mouth daily.   metFORMIN (GLUCOPHAGE) 500 MG tablet TAKE 1 TABLET BY MOUTH EVERY DAY WITH BREAKFAST   Misc Natural Products (OSTEO BI-FLEX JOINT SHIELD PO) Take 1 capsule by mouth daily.    Multiple Vitamin (MULTIVITAMIN) tablet Take 1 tablet by mouth daily.   OneTouch Delica Lancets 54G MISC Use as directed to check blood sugar once daily as needed.  Dx code: E11.9   Sodium Sulfate-Mag Sulfate-KCl (SUTAB) [phone] MG TABS Take 1 kit by mouth as directed. MANUFACTURER CODES!! BIN: [id number] PCN: CN GROUP: [id number] MEMBER ID: [id number];MMH AS SECONDARY INSURANCE ;NO PRIOR AUTHORIZATION   No facility-administered encounter medications on file as of 06/25/2020.    Dennis Ayers was contacted to remind him of his upcoming telephone visit with Debbora Dus on 07/02/20 at 10:00 AM. He was reminded to have all medications, supplements and any blood glucose and blood pressure readings available for review at appointment.   Are you having any problems with your medications?  Not at this time  What concerns would you like to discuss with the pharmacist? No concerns to discuss    Star Rating Drugs: Medication:  Last Fill: Day Supply Atorvastatin 20 mg 03/13/20 90 Losartan 100 mg 02/16/20 90 Metformin 500 mg 02/21/20 [place], CPP notified  Margaretmary Dys, [place] [phone]   I have reviewed the care management and care coordination activities outlined in this encounter and I am certifying that I agree with the content of this note. No further action required.  Debbora Dus, PharmD Clinical Pharmacist [facility] at [facility] [phone]

## 2020-07-01 ENCOUNTER — Telehealth: Payer: Self-pay

## 2020-07-01 NOTE — Progress Notes (Deleted)
Chronic Care Management Pharmacy Note  07/01/2020 Name:  Dennis Ayers MRN:  [id number] DOB:  November 26, 1950  Subjective: Dennis Ayers is an 70 y.o. year old male who is a primary patient of Ria Bush, MD.  The CCM team was consulted for assistance with disease management and care coordination needs.    Engaged with patient by telephone for follow up visit in response to provider referral for pharmacy case management and/or care coordination services.   Consent to Services:  The patient was given information about Chronic Care Management services, agreed to services, and gave verbal consent prior to initiation of services.  Please see initial visit note for detailed documentation.   Patient Care Team: Ria Bush, MD as PCP - General (Family Medicine) End, Harrell Gave, MD as PCP - Cardiology (Cardiology) Marchia Bond, MD as Consulting Physician (Orthopedic Surgery) End, Harrell Gave, MD as Consulting Physician (Cardiology) Gatha Mayer, MD as Consulting Physician (Gastroenterology) Macarthur Critchley, OD as Consulting Physician (Optometry) Debbora Dus, [facility] as Pharmacist (Pharmacist)  Recent office visits: ***  Recent consult visits: [facility] visits: None in previous 6 months  Objective:  Lab Results  Component Value Date   CREATININE 1.03 10/02/2019   BUN 23 10/02/2019   GFR 71.41 10/02/2019   GFRNONAA >60 06/02/2016   GFRAA >60 06/02/2016   NA 141 10/02/2019   K 4.7 10/02/2019   CALCIUM 9.1 10/02/2019   CO2 30 10/02/2019   GLUCOSE 157 (H) 10/02/2019    Lab Results  Component Value Date/Time   HGBA1C 6.4 (A) 04/07/2020 08:55 AM   HGBA1C 6.4 10/02/2019 07:36 AM   HGBA1C 6.0 (A) 04/06/2019 07:24 AM   HGBA1C 5.9 09/28/2018 07:40 AM   GFR 71.41 10/02/2019 07:36 AM   GFR 80.58 09/28/2018 07:40 AM   MICROALBUR 1.0 03/27/2018 08:54 AM   MICROALBUR 0.8 03/04/2017 08:00 AM    Last diabetic Eye exam:  Lab Results  Component Value Date/Time    HMDIABEYEEXA No Retinopathy 07/13/2019 12:00 AM    Last diabetic Foot exam: No results found for: HMDIABFOOTEX   Lab Results  Component Value Date   CHOL 99 10/02/2019   HDL 53.50 10/02/2019   LDLCALC 37 10/02/2019   TRIG 43.0 10/02/2019   CHOLHDL 2 10/02/2019    Hepatic Function Latest Ref Rng & Units 10/02/2019 09/28/2018 03/16/2018  Total Protein 6.0 - 8.3 g/dL 6.6 6.9 -  Albumin 3.5 - 5.2 g/dL 4.2 4.4 -  AST 0 - 37 U/L 15 17 -  ALT 0 - 53 U/L 25 28 24   Alk Phosphatase 39 - 117 U/L 55 67 -  Total Bilirubin 0.2 - 1.2 mg/dL 0.8 0.8 -  Bilirubin, Direct 0.0 - 0.3 mg/dL - - -    Lab Results  Component Value Date/Time   TSH 1.48 03/16/2002 12:00 AM   TSH 0.91 11/04/2000 12:00 AM    CBC Latest Ref Rng & Units 10/02/2019 09/28/2018 09/09/2017  WBC 4.0 - 10.5 K/uL 5.1 5.3 4.9  Hemoglobin 13.0 - 17.0 g/dL 15.7 16.4 16.6  Hematocrit 39.0 - 52.0 % 45.2 47.9 47.9  Platelets 150.0 - 400.0 K/uL 201.0 213.0 226.0    No results found for: VD25OH  Clinical ASCVD: Yes  The ASCVD Risk score Mikey Bussing DC Jr., et al., 2013) failed to calculate for the following reasons:   The valid total cholesterol range is 130 to 320 mg/dL    Depression screen [facility] 2/9 10/02/2019 09/25/2018 09/09/2017  Decreased Interest 0 0 0  Down, Depressed,  Hopeless 0 0 0  PHQ - 2 Score 0 0 0  Altered sleeping 0 1 0  Tired, decreased energy 0 0 0  Change in appetite 0 0 0  Feeling bad or failure about yourself  0 0 0  Trouble concentrating 0 0 0  Moving slowly or fidgety/restless 0 0 0  Suicidal thoughts 0 0 0  PHQ-9 Score 0 1 0  Difficult doing work/chores Not difficult at all Not difficult at all Not difficult at all    Social History   Tobacco Use  Smoking Status Never Smoker  Smokeless Tobacco Never Used   BP Readings from Last 3 Encounters:  04/07/20 140/80  01/21/20 108/69  12/19/19 120/70   Pulse Readings from Last 3 Encounters:  04/07/20 87  01/21/20 81  12/19/19 86   Wt Readings from Last 3  Encounters:  04/07/20 (!) 334 lb 4 oz (151.6 kg)  01/21/20 (!) 335 lb (152 kg)  01/07/20 (!) 335 lb (152 kg)   BMI Readings from Last 3 Encounters:  04/07/20 42.34 kg/m  01/21/20 42.44 kg/m  01/07/20 42.44 kg/m    Assessment/Interventions: Review of patient past medical history, allergies, medications, health status, including review of consultants reports, laboratory and other test data, was performed as part of comprehensive evaluation and provision of chronic care management services.   SDOH:  (Social Determinants of Health) assessments and interventions performed: {yes/no:20286}  SDOH Screenings   Alcohol Screen: Low Risk   . Last Alcohol Screening Score (AUDIT): 0  Depression (PHQ2-9): Low Risk   . PHQ-2 Score: 0  Financial Resource Strain: Low Risk   . Difficulty of Paying Living Expenses: Not hard at all  Food Insecurity: No Food Insecurity  . Worried About Charity fundraiser in the Last Year: Never true  . Ran Out of Food in the Last Year: Never true  Housing: Low Risk   . Last Housing Risk Score: 0  Physical Activity: Inactive  . Days of Exercise per Week: 0 days  . Minutes of Exercise per Session: 0 min  Social Connections: Not on file  Stress: No Stress Concern Present  . Feeling of Stress : Not at all  Tobacco Use: Low Risk   . Smoking Tobacco Use: Never Smoker  . Smokeless Tobacco Use: Never Used  Transportation Needs: No Transportation Needs  . Lack of Transportation (Medical): No  . Lack of Transportation (Non-Medical): No    CCM Care Plan  Allergies  Allergen Reactions  . Lisinopril Other (See Comments)    cough    Medications Reviewed Today    Reviewed by Brenton Grills, [place] (Certified Medical Assistant) on 04/07/20 at [id number]  Med List Status: <None>  Medication Order Taking? Sig Documenting Provider Last Dose Status Informant  acetaminophen (TYLENOL) 500 MG tablet [id number]  Take 1,000 mg by mouth every 6 (six) hours as needed. [provider]  Active   aspirin EC 81 MG tablet [id number]  Take 81 mg by mouth daily. [provider]  Active Self  atorvastatin (LIPITOR) 20 MG tablet [id number]  Take 1 tablet (20 mg total) by mouth daily at 6 PM. Marrianne Mood D, PA-C  Active   Continuous Blood Gluc Sensor (FREESTYLE LIBRE 14 DAY SENSOR) MISC [id number]  1 Units by Does not apply route every 14 (fourteen) days. Ria Bush, MD  Active   DiphenhydrAMINE HCl, Sleep, (UNISOM SLEEPMELTS) 25 MG TBDP [id number]  Take 25 mg by mouth at bedtime.  [provider]  Active  Self  glucose blood (ONE TOUCH ULTRA TEST) test strip [id number]  USE AS INSTRUCTED TO CHECK BLOOD SUGAR ONCE A DAY AS NEEDED. DX CODE: E11.9 Ria Bush, MD  Active   ibuprofen (ADVIL,MOTRIN) 200 MG tablet [id number]  Take 600 mg by mouth daily as needed for moderate pain. [provider]  Active Self  Krill Oil 300 MG CAPS [id number]  Take 300 mg by mouth daily. [provider]  Active Self  losartan (COZAAR) 100 MG tablet [id number]  Take 0.5 tablets (50 mg total) by mouth daily. Ria Bush, MD  Active   losartan (COZAAR) 50 MG tablet [id number]  Take 1 tablet (50 mg total) by mouth daily. Ria Bush, MD  Active   metFORMIN (GLUCOPHAGE) 500 MG tablet [id number]  TAKE 1 TABLET BY MOUTH EVERY DAY WITH BREAKFAST Ria Bush, MD  Active   Misc Natural Products (OSTEO BI-FLEX JOINT SHIELD PO) [id number]  Take 1 capsule by mouth daily.  [provider]  Active Self  Multiple Vitamin (MULTIVITAMIN) tablet [id number]  Take 1 tablet by mouth daily. [provider]  Active Self  OneTouch Delica Lancets 81K MISC [id number]  Use as directed to check blood sugar once daily as needed.  Dx code: E11.9 Ria Bush, MD  Active   Sodium Sulfate-Mag Sulfate-KCl (SUTAB) [phone] MG TABS [id number]  Take 1 kit by mouth as directed. MANUFACTURER CODES!! Kara Dies: [id number] PCN: CN GROUP: [id number] MEMBER ID:  [id number];[facility] AS SECONDARY INSURANCE ;NO PRIOR AUTHORIZATION Armbruster, Carlota Raspberry, MD  Active           Patient Active Problem List   Diagnosis Date Noted  . Hyperlipidemia associated with type 2 diabetes mellitus ([place]) - goal LDL <70 09/16/2018  . Genetic testing 01/11/2018  . Family history of ovarian cancer   . Redundant colon 11/16/2017  . Health maintenance examination 09/10/2016  . Advanced care planning/counseling discussion 09/10/2016  . Coronary artery calcification 08/18/2016  . Dilated aortic root ([place]) 08/18/2016  . Controlled diabetes mellitus type 2 with complications ([place]) 03/54/6568  . Family history of coronary artery disease 05/20/2016  . Essential hypertension 05/20/2016  . Erectile dysfunction 07/10/2015  . Polycythemia 07/05/2012  . OSA (obstructive sleep apnea)   . Personal history of colonic polyps-serrated adenomas and tubular adenoma 08/20/2011  . Knee osteoarthritis 07/23/2011  . Nodule of finger, left 07/23/2011  . ROTATOR CUFF SYNDROME, RIGHT 08/02/2008  . Obesity, morbid, BMI 40.0-49.9 ([place]) 02/15/2008  . NEVI, MULTIPLE 05/23/2007    Immunization History  Administered Date(s) Administered  . Hepatitis B, adult 03/11/2017, 06/14/2017, 10/18/2017  . Influenza Whole 11/23/2011  . Influenza, High Dose Seasonal PF 11/03/2014, 11/21/2015, 10/01/2016, 10/20/2018, 11/01/2019, 11/02/2019  . Influenza-Unspecified 09/30/2017  . PFIZER Comirnaty(Gray Top)Covid-19 Tri-Sucrose Vaccine 05/23/2020  . PFIZER(Purple Top)SARS-COV-2 Vaccination 04/02/2019, 04/25/2019, 11/22/2019, 05/23/2020  . Pneumococcal Conjugate-13 07/10/2015  . Pneumococcal Polysaccharide-23 11/21/2015  . Td 03/23/2002  . Tdap 07/10/2015  . Zoster 07/23/2011  . Zoster Recombinat (Shingrix) 11/18/2017, 02/22/2018    Conditions to be addressed/monitored:  {USCCMDZASSESSMENTOPTIONS:23563}  There are no care plans that you recently modified to display for this patient.    Medication  Assistance: {MEDASSISTANCEINFO:25044}  Patient's preferred pharmacy is:  [id number] [place] [place]Phone: [phone]: [address] [place]Phone: [phone]: [phone] Uses pill box? {Yes or If no, why  not?:20788} Pt endorses ***% compliance  We discussed: {Pharmacy options:24294} Patient decided to: {US Pharmacy Plan:23885}  Care Plan and Follow Up Patient Decision:  {FOLLOWUP:24991}  Plan: {CM FOLLOW UP PLAN:25073}  ***

## 2020-07-01 NOTE — Telephone Encounter (Signed)
Contacted patient by telephone to cancel CCM visit on 07/02/20 due to pharmacist schedule conflict. Patient's wife took message and was okay with call back later this month to reschedule. Reviewed labs and medications and no urgent care needs at this time. Will have CMA call next week for BP and BG log and to reschedule CCM visit for June or July, soonest available.  Debbora Dus, PharmD Clinical Pharmacist [facility] at [facility] [phone]

## 2020-07-02 ENCOUNTER — Telehealth: Payer: Medicare Other

## 2020-07-07 ENCOUNTER — Telehealth: Payer: Self-pay

## 2020-07-07 NOTE — Chronic Care Management (AMB) (Addendum)
Chronic Care Management Pharmacy Assistant   Name: Dennis Ayers  MRN: [id number] DOB: 02/07/1951  Reason for Encounter: Disease State- Hypertension and Diabetes    Recent office visits:  04/07/20- Dr. [name]- PCP. Continue losartan 50 mg daily. Follow up in 6 months.  Recent consult visits:  None since last CCM contact  Hospital visits:  None in previous 6 months  Medications: Outpatient Encounter Medications as of 07/07/2020  Medication Sig   acetaminophen (TYLENOL) 500 MG tablet Take 1,000 mg by mouth every 6 (six) hours as needed.   aspirin EC 81 MG tablet Take 81 mg by mouth daily.   atorvastatin (LIPITOR) 20 MG tablet Take 1 tablet (20 mg total) by mouth daily at 6 PM.   Continuous Blood Gluc Sensor (FREESTYLE LIBRE 14 DAY SENSOR) MISC 1 Units by Does not apply route every 14 (fourteen) days.   diphenhydrAMINE HCl, Sleep, 25 MG TBDP Take 25 mg by mouth at bedtime.    glucose blood (ONE TOUCH ULTRA TEST) test strip USE AS INSTRUCTED TO CHECK BLOOD SUGAR ONCE A DAY AS NEEDED. DX CODE: E11.9   Krill Oil 300 MG CAPS Take 300 mg by mouth daily.   losartan (COZAAR) 100 MG tablet Take 0.5 tablets (50 mg total) by mouth daily.   metFORMIN (GLUCOPHAGE) 500 MG tablet TAKE 1 TABLET BY MOUTH EVERY DAY WITH BREAKFAST   Misc Natural Products (OSTEO BI-FLEX JOINT SHIELD PO) Take 1 capsule by mouth daily.    Multiple Vitamin (MULTIVITAMIN) tablet Take 1 tablet by mouth daily.   OneTouch Delica Lancets 52W MISC Use as directed to check blood sugar once daily as needed.  Dx code: E11.9   Sodium Sulfate-Mag Sulfate-KCl (SUTAB) [phone] MG TABS Take 1 kit by mouth as directed. MANUFACTURER CODES!! BIN: [id number] PCN: CN GROUP: [id number] MEMBER ID: [id number];VFI AS SECONDARY INSURANCE ;NO PRIOR AUTHORIZATION   No facility-administered encounter medications on file as of 07/07/2020.     Recent Relevant Labs: Lab Results  Component Value Date/Time   HGBA1C 6.4 (A) 04/07/2020 08:55  AM   HGBA1C 6.4 10/02/2019 07:36 AM   HGBA1C 6.0 (A) 04/06/2019 07:24 AM   HGBA1C 5.9 09/28/2018 07:40 AM   MICROALBUR 1.0 03/27/2018 08:54 AM   MICROALBUR 0.8 03/04/2017 08:00 AM    Kidney Function Lab Results  Component Value Date/Time   CREATININE 1.03 10/02/2019 07:36 AM   CREATININE 0.93 09/28/2018 07:40 AM   GFR 71.41 10/02/2019 07:36 AM   GFRNONAA >60 06/02/2016 07:39 AM   GFRAA >60 06/02/2016 07:39 AM     Current antihyperglycemic regimen:  Metformin 500 mg 1 tablet daily with breakfast   Patient verbally confirms he is taking the above medications as directed. Yes  What recent interventions/DTPs have been made to improve glycemic control:  No changes or interventions have been made to improve glycemic control.   Have there been any recent hospitalizations or ED visits since last visit with CPP? No  Patient denies hypoglycemic symptoms, including Pale, Sweaty, Shaky, Hungry, Nervous/irritable and Vision changes  Patient denies hyperglycemic symptoms, including blurry vision, excessive thirst, fatigue, polyuria and weakness  How often are you checking your blood sugar? 3-4 times daily  What are your blood sugars ranging?  90 day average: 147 30 day average: 143 14 day average: 142 7 day average: 142   On insulin? No  During the week, how often does your blood glucose drop below 70? Never  Are you checking your feet daily/regularly? No  Adherence Review:  Is the patient currently on a STATIN medication? Yes Is the patient currently on ACE/ARB medication? Yes Does the patient have >5 day gap between last estimated fill dates? CPP to review   Recent Office Vitals: BP Readings from Last 3 Encounters:  04/07/20 140/80  01/21/20 108/69  12/19/19 120/70   Pulse Readings from Last 3 Encounters:  04/07/20 87  01/21/20 81  12/19/19 86    Wt Readings from Last 3 Encounters:  04/07/20 (!) 334 lb 4 oz (151.6 kg)  01/21/20 (!) 335 lb (152 kg)  01/07/20 (!)  335 lb (152 kg)     Kidney Function Lab Results  Component Value Date/Time   CREATININE 1.03 10/02/2019 07:36 AM   CREATININE 0.93 09/28/2018 07:40 AM   GFR 71.41 10/02/2019 07:36 AM   GFRNONAA >60 06/02/2016 07:39 AM   GFRAA >60 06/02/2016 07:39 AM    BMP Latest Ref Rng & Units 10/02/2019 09/28/2018 09/09/2017  Glucose 70 - 99 mg/dL 157(H) 143(H) 109(H)  BUN 6 - 23 mg/dL 23 21 25(H)  Creatinine 0.40 - 1.50 mg/dL 1.03 0.93 0.96  BUN/Creat Ratio 10 - 24 - - -  Sodium 135 - 145 mEq/L 141 140 140  Potassium 3.5 - 5.1 mEq/L 4.7 4.3 4.3  Chloride 96 - 112 mEq/L 103 104 105  CO2 19 - 32 mEq/L _0 Calcium 8.4 - 10.5 mg/dL 9.1 9.4 9.3     Current antihypertensive regimen:  Losartan 50 mg 1 tablet daily  Patient verbally confirms he is taking the above medications as directed. Yes  How often are you checking your Blood Pressure? infrequently  he checks his blood pressure in the middle of the day after taking his medication.  Current home BP readings:   DATE:             BP               PULSE  -  126/84  -    Wrist or arm cuff: Arm Caffeine intake: Does not drink much caffeine Salt intake: Limits salt OTC medications including pseudoephedrine or NSAIDs? No  Any readings above 180/120? No If yes any symptoms of hypertensive emergency? patient denies any symptoms of high blood pressure   What recent interventions/DTPs have been made by any provider to improve Blood Pressure control since last CPP Visit:  No recent interventions have been made to improve blood pressure.   Any recent hospitalizations or ED visits since last visit with CPP? No  What diet changes have been made to improve Blood Pressure Control?  No changes in diet  What exercise is being done to improve your Blood Pressure Control?  States he is active in the yard, almost daily  Adherence Review: Is the patient currently on ACE/ARB medication? Yes Does the patient have >5 day gap between last  estimated fill dates? Yes   Star Rating Drugs:  Medication:  Last Fill: Day Supply Atorvastatin 20 mg 03/13/20 90 Losartan 100 mg 02/16/20 90 Metformin 500 mg 02/21/20 90  Follow-Up:  Pharmacist Review  Debbora Dus, CPP notified  Margaretmary Dys, [place] Assistant [phone]  I have reviewed the care management and care coordination activities outlined in this encounter and I am certifying that I agree with the content of this note. Discuss refill history at follow up next month.  Debbora Dus, PharmD Clinical Pharmacist [facility] at [facility] [phone]

## 2020-07-15 DIAGNOSIS — H2513 Age-related nuclear cataract, bilateral: Secondary | ICD-10-CM | POA: Diagnosis not present

## 2020-07-15 LAB — HM DIABETES EYE EXAM

## 2020-07-17 ENCOUNTER — Encounter: Payer: Self-pay | Admitting: Family Medicine

## 2020-07-31 ENCOUNTER — Encounter: Payer: Self-pay | Admitting: Family Medicine

## 2020-07-31 NOTE — Telephone Encounter (Signed)
Looks like pt had recent phn visit with Sharyn Lull on 07/07/20.  Do you want to go ahead and schedule OV?

## 2020-08-01 MED ORDER — METFORMIN HCL 500 MG PO TABS
500.0000 mg | ORAL_TABLET | Freq: Two times a day (BID) | ORAL | 0 refills | Status: DC
Start: 2020-08-01 — End: 2020-10-08

## 2020-08-01 NOTE — Addendum Note (Signed)
Addended by: Ria Bush on: 08/01/2020 07:26 AM   Modules accepted: Orders

## 2020-08-06 DIAGNOSIS — M1711 Unilateral primary osteoarthritis, right knee: Secondary | ICD-10-CM | POA: Diagnosis not present

## 2020-08-06 DIAGNOSIS — Z96652 Presence of left artificial knee joint: Secondary | ICD-10-CM | POA: Diagnosis not present

## 2020-08-12 ENCOUNTER — Telehealth: Payer: Self-pay

## 2020-08-12 NOTE — Chronic Care Management (AMB) (Addendum)
    Chronic Care Management Pharmacy Assistant   Name: NASEAN ZAPF  MRN: [id number] DOB: [birth year]  Reason for Encounter: CCM Reminder Call   Recent office visits:  04/07/20 - Dr.Gutierrez no medication changes  Recent consult visits:  None since last CCM contact  Hospital visits:  None in previous 6 months  Medications: Outpatient Encounter Medications as of 08/12/2020  Medication Sig   acetaminophen (TYLENOL) 500 MG tablet Take 1,000 mg by mouth every 6 (six) hours as needed.   aspirin EC 81 MG tablet Take 81 mg by mouth daily.   atorvastatin (LIPITOR) 20 MG tablet Take 1 tablet (20 mg total) by mouth daily at 6 PM.   Continuous Blood Gluc Sensor (FREESTYLE LIBRE 14 DAY SENSOR) MISC 1 Units by Does not apply route every 14 (fourteen) days.   diphenhydrAMINE HCl, Sleep, 25 MG TBDP Take 25 mg by mouth at bedtime.    glucose blood (ONE TOUCH ULTRA TEST) test strip USE AS INSTRUCTED TO CHECK BLOOD SUGAR ONCE A DAY AS NEEDED. DX CODE: E11.9   Krill Oil 300 MG CAPS Take 300 mg by mouth daily.   losartan (COZAAR) 100 MG tablet Take 0.5 tablets (50 mg total) by mouth daily.   metFORMIN (GLUCOPHAGE) 500 MG tablet Take 1 tablet (500 mg total) by mouth 2 (two) times daily with a meal.   Misc Natural Products (OSTEO BI-FLEX JOINT SHIELD PO) Take 1 capsule by mouth daily.    Multiple Vitamin (MULTIVITAMIN) tablet Take 1 tablet by mouth daily.   OneTouch Delica Lancets 57X MISC Use as directed to check blood sugar once daily as needed.  Dx code: E11.9   Sodium Sulfate-Mag Sulfate-KCl (SUTAB) [phone] MG TABS Take 1 kit by mouth as directed. MANUFACTURER CODES!! BIN: [id number] PCN: CN GROUP: [id number] MEMBER ID: [id number];TRV AS SECONDARY INSURANCE ;NO PRIOR AUTHORIZATION   No facility-administered encounter medications on file as of 08/12/2020.   DONTREY SNELLGROVE was contacted to remind him of his upcoming telephone visit with Debbora Dus on 08/14/2020 at 9:30 AM.  He was reminded to  have all medications, supplements and any blood glucose and blood pressure readings available for review at appointment.   Are you having any problems with your medications? He reports Dr. Darnell Level recently increased metformin to 1 tablet two times daily. Patient reports he uses [employer] for his medications.  What concerns would you like to discuss with the pharmacist? No    Star Rating Drugs: Medication:  Last Fill: Day Supply Metformin $RemoveBeforeDE'500mg'[id number]$  08/06/20 90 Losartan $RemoveBef'100mg'[id number]$  02/16/20 90 Atorvastatin $RemoveBeforeD'20mg'pmbLocIzHlwGvO$  03/13/20 90  The patient reports he is taking the above medications as prescribed.  Next PCP visit 10/08/20  Debbora Dus, CPP notified  Avel Sensor, [place] Assistant [phone]  I have reviewed the care management and care coordination activities outlined in this encounter and I am certifying that I agree with the content of this note. No further action required.  Debbora Dus, PharmD Clinical Pharmacist [place] at [facility] [phone]

## 2020-08-14 ENCOUNTER — Other Ambulatory Visit: Payer: Self-pay

## 2020-08-14 ENCOUNTER — Ambulatory Visit (INDEPENDENT_AMBULATORY_CARE_PROVIDER_SITE_OTHER): Payer: Medicare Other

## 2020-08-14 DIAGNOSIS — I10 Essential (primary) hypertension: Secondary | ICD-10-CM

## 2020-08-14 DIAGNOSIS — E1169 Type 2 diabetes mellitus with other specified complication: Secondary | ICD-10-CM | POA: Diagnosis not present

## 2020-08-14 DIAGNOSIS — E118 Type 2 diabetes mellitus with unspecified complications: Secondary | ICD-10-CM | POA: Diagnosis not present

## 2020-08-14 DIAGNOSIS — E785 Hyperlipidemia, unspecified: Secondary | ICD-10-CM

## 2020-08-14 NOTE — Progress Notes (Signed)
Chronic Care Management Pharmacy Note  08/14/2020 Name:  Dennis Ayers MRN:  [id number] DOB:  Aug 15, 1950  Summary: Patient reports BG are much better on metformin twice daily (average 138 last 7 days on CGM). His home BP is 138/73 today and controlled in office visits. His LDL < 70. Denies health concerns.  Recommendations/Changes made from today's visit: Discussed his weight loss goals and strategies for weight loss. He is motivated to lose weight.   Plan: CCM follow up 12 months   Subjective: Dennis Ayers is an 70 y.o. year old male who is a primary patient of Ria Bush, MD.  The CCM team was consulted for assistance with disease management and care coordination needs.    Engaged with patient by telephone for follow up visit in response to provider referral for pharmacy case management and/or care coordination services. Patient denies health concerns. He reports on 08/01/20 his metformin was increased to BID. Glucose has improved a lot since then.  Consent to Services:  The patient was given information about Chronic Care Management services, agreed to services, and gave verbal consent prior to initiation of services.  Please see initial visit note for detailed documentation.   Patient Care Team: Ria Bush, MD as PCP - General (Family Medicine) End, Harrell Gave, MD as PCP - Cardiology (Cardiology) Marchia Bond, MD as Consulting Physician (Orthopedic Surgery) End, Harrell Gave, MD as Consulting Physician (Cardiology) Gatha Mayer, MD as Consulting Physician (Gastroenterology) Macarthur Critchley, OD as Consulting Physician (Optometry) Debbora Dus, [facility] as Pharmacist (Pharmacist)  Recent office visits:  07/31/20 - Patient message, high BG - Increase metformin to 500 mg BID 04/07/20 - Dr.Gutierrez - no medication changes   Recent consult visits:  None since last CCM contact   Hospital visits:  None in previous 6 months  Objective:  Lab Results  Component  Value Date   CREATININE 1.03 10/02/2019   BUN 23 10/02/2019   GFR 71.41 10/02/2019   GFRNONAA >60 06/02/2016   GFRAA >60 06/02/2016   NA 141 10/02/2019   K 4.7 10/02/2019   CALCIUM 9.1 10/02/2019   CO2 30 10/02/2019   GLUCOSE 157 (H) 10/02/2019    Lab Results  Component Value Date/Time   HGBA1C 6.4 (A) 04/07/2020 08:55 AM   HGBA1C 6.4 10/02/2019 07:36 AM   HGBA1C 6.0 (A) 04/06/2019 07:24 AM   HGBA1C 5.9 09/28/2018 07:40 AM   GFR 71.41 10/02/2019 07:36 AM   GFR 80.58 09/28/2018 07:40 AM   MICROALBUR 1.0 03/27/2018 08:54 AM   MICROALBUR 0.8 03/04/2017 08:00 AM    Last diabetic Eye exam:  Lab Results  Component Value Date/Time   HMDIABEYEEXA No Retinopathy 07/15/2020 12:00 AM    Last diabetic Foot exam:  04/07/20 normal   Lab Results  Component Value Date   CHOL 99 10/02/2019   HDL 53.50 10/02/2019   LDLCALC 37 10/02/2019   TRIG 43.0 10/02/2019   CHOLHDL 2 10/02/2019    Hepatic Function Latest Ref Rng & Units 10/02/2019 09/28/2018 03/16/2018  Total Protein 6.0 - 8.3 g/dL 6.6 6.9 -  Albumin 3.5 - 5.2 g/dL 4.2 4.4 -  AST 0 - 37 U/L 15 17 -  ALT 0 - 53 U/L 25 28 24   Alk Phosphatase 39 - 117 U/L 55 67 -  Total Bilirubin 0.2 - 1.2 mg/dL 0.8 0.8 -  Bilirubin, Direct 0.0 - 0.3 mg/dL - - -    Lab Results  Component Value Date/Time   TSH 1.48 03/16/2002 12:00 AM  TSH 0.91 11/04/2000 12:00 AM    CBC Latest Ref Rng & Units 10/02/2019 09/28/2018 09/09/2017  WBC 4.0 - 10.5 K/uL 5.1 5.3 4.9  Hemoglobin 13.0 - 17.0 g/dL 15.7 16.4 16.6  Hematocrit 39.0 - 52.0 % 45.2 47.9 47.9  Platelets 150.0 - 400.0 K/uL 201.0 213.0 226.0    No results found for: VD25OH  Clinical ASCVD: Yes  The ASCVD Risk score Mikey Bussing DC Jr., et al., 2013) failed to calculate for the following reasons:   The valid total cholesterol range is 130 to 320 mg/dL    Depression screen [facility] 2/9 10/02/2019 09/25/2018 09/09/2017  Decreased Interest 0 0 0  Down, Depressed, Hopeless 0 0 0  PHQ - 2 Score 0 0 0  Altered  sleeping 0 1 0  Tired, decreased energy 0 0 0  Change in appetite 0 0 0  Feeling bad or failure about yourself  0 0 0  Trouble concentrating 0 0 0  Moving slowly or fidgety/restless 0 0 0  Suicidal thoughts 0 0 0  PHQ-9 Score 0 1 0  Difficult doing work/chores Not difficult at all Not difficult at all Not difficult at all    Social History   Tobacco Use  Smoking Status Never  Smokeless Tobacco Never   BP Readings from Last 3 Encounters:  04/07/20 140/80  01/21/20 108/69  12/19/19 120/70   Pulse Readings from Last 3 Encounters:  04/07/20 87  01/21/20 81  12/19/19 86   Wt Readings from Last 3 Encounters:  04/07/20 (!) 334 lb 4 oz (151.6 kg)  01/21/20 (!) 335 lb (152 kg)  01/07/20 (!) 335 lb (152 kg)   BMI Readings from Last 3 Encounters:  04/07/20 42.34 kg/m  01/21/20 42.44 kg/m  01/07/20 42.44 kg/m    Assessment/Interventions: Review of patient past medical history, allergies, medications, health status, including review of consultants reports, laboratory and other test data, was performed as part of comprehensive evaluation and provision of chronic care management services.   SDOH:  (Social Determinants of Health) assessments and interventions performed: Yes SDOH Interventions    Flowsheet Row Most Recent Value  SDOH Interventions   Financial Strain Interventions Intervention Not Indicated      SDOH Screenings   Alcohol Screen: Low Risk    Last Alcohol Screening Score (AUDIT): 0  Depression (PHQ2-9): Low Risk    PHQ-2 Score: 0  Financial Resource Strain: Low Risk    Difficulty of Paying Living Expenses: Not very hard  Food Insecurity: No Food Insecurity   Worried About Charity fundraiser in the Last Year: Never true   Ran Out of Food in the Last Year: Never true  Housing: Low Risk    Last Housing Risk Score: 0  Physical Activity: Inactive   Days of Exercise per Week: 0 days   Minutes of Exercise per Session: 0 min  Social Connections: Not on file   Stress: No Stress Concern Present   Feeling of Stress : Not at all  Tobacco Use: Low Risk    Smoking Tobacco Use: Never   Smokeless Tobacco Use: Never  Transportation Needs: No Transportation Needs   Lack of Transportation (Medical): No   Lack of Transportation (Non-Medical): No    CCM Care Plan  Allergies  Allergen Reactions   Lisinopril Other (See Comments)    cough    Medications Reviewed Today     Reviewed by Debbora Dus, [facility] (Pharmacist) on 08/14/20 at [phone]  Med List Status: <None>   Medication Order  Taking? Sig Documenting Provider Last Dose Status Informant  acetaminophen (TYLENOL) 500 MG tablet [id number] Yes Take 1,000 mg by mouth every 6 (six) hours as needed. [provider] Taking Active   aspirin EC 81 MG tablet [id number] Yes Take 81 mg by mouth daily. [provider] Taking Active Self  atorvastatin (LIPITOR) 20 MG tablet [id number] Yes Take 1 tablet (20 mg total) by mouth daily at 6 PM. Marrianne Mood D, PA-C Taking Active   Continuous Blood Gluc Sensor (FREESTYLE LIBRE [place]) [place] [id number] Yes 1 Units by Does not apply route every 14 (fourteen) days. Ria Bush, MD Taking Active   diphenhydrAMINE HCl, Sleep, 25 MG TBDP [id number] Yes Take 25 mg by mouth at bedtime. Takes every night [provider] Taking Active Self  glucose blood (ONE TOUCH ULTRA TEST) test strip [id number] Yes USE AS INSTRUCTED TO CHECK BLOOD SUGAR ONCE A DAY AS NEEDED. DX CODE: E11.9 Ria Bush, MD Taking Active   Krill Oil 300 MG CAPS [id number] Yes Take 300 mg by mouth daily. [provider] Taking Active Self  losartan (COZAAR) 100 MG tablet [id number] Yes Take 0.5 tablets (50 mg total) by mouth daily. Ria Bush, MD Taking Active   metFORMIN (GLUCOPHAGE) 500 MG tablet [id number] Yes Take 1 tablet (500 mg total) by mouth 2 (two) times daily with a meal. Ria Bush, MD Taking Active   Misc Natural Products (OSTEO BI-FLEX  JOINT SHIELD PO) [id number] Yes Take 1 capsule by mouth daily.  [provider] Taking Active Self  Multiple Vitamin (MULTIVITAMIN) tablet [id number] Yes Take 1 tablet by mouth daily. [provider] Taking Active Self  OneTouch Delica Lancets 46O MISC [id number] Yes Use as directed to check blood sugar once daily as needed.  Dx code: E11.9 Ria Bush, MD Taking Active             Patient Active Problem List   Diagnosis Date Noted   Hyperlipidemia associated with type 2 diabetes mellitus ([place]) - goal LDL <70 09/16/2018   Genetic testing 01/11/2018   Family history of ovarian cancer    Redundant colon 11/16/2017   Health maintenance examination 09/10/2016   Advanced care planning/counseling discussion 09/10/2016   Coronary artery calcification 08/18/2016   Dilated aortic root ([place]) 08/18/2016   Controlled diabetes mellitus type 2 with complications ([place]) 50/04/7046   Family history of coronary artery disease 05/20/2016   Essential hypertension 05/20/2016   Erectile dysfunction 07/10/2015   Polycythemia 07/05/2012   OSA (obstructive sleep apnea)    Personal history of colonic polyps-serrated adenomas and tubular adenoma 08/20/2011   Knee osteoarthritis 07/23/2011   Nodule of finger, left 07/23/2011   ROTATOR CUFF SYNDROME, RIGHT 08/02/2008   Obesity, morbid, BMI 40.0-49.9 ([place]) 02/15/2008   NEVI, MULTIPLE 05/23/2007    Immunization History  Administered Date(s) Administered   Hepatitis B, adult 03/11/2017, 06/14/2017, 10/18/2017   Influenza Whole 11/23/2011   Influenza, High Dose Seasonal PF 11/03/2014, 11/21/2015, 10/01/2016, 10/20/2018, 11/01/2019, 11/02/2019   Influenza-Unspecified 09/30/2017   PFIZER Comirnaty(Gray Top)Covid-19 Tri-Sucrose Vaccine 05/23/2020   PFIZER(Purple Top)SARS-COV-2 Vaccination 04/02/2019, 04/25/2019, 11/22/2019, 05/23/2020   PNEUMOCOCCAL CONJUGATE-20 08/07/2020   Pneumococcal Conjugate-13 07/10/2015   Pneumococcal  Polysaccharide-23 11/21/2015   Td 03/23/2002   Tdap 07/10/2015   Zoster Recombinat (Shingrix) 11/18/2017, 02/22/2018   Zoster, Live 07/23/2011    Conditions to be addressed/monitored:  Hypertension, Hyperlipidemia, and Diabetes  Care Plan : [place]  Updates made by Debbora Dus, [facility] since 08/14/2020 12:00 AM  Problem: CHL AMB "PATIENT-SPECIFIC PROBLEM"      Long-Range Goal: Disease Management   Start Date: 08/14/2020  Priority: High  Note:   Current Barriers:  None identified  Pharmacist Clinical Goal(s):  Patient will contact provider office for questions/concerns as evidenced notation of same in electronic health record through collaboration with PharmD and provider.   Interventions: 1:1 collaboration with Ria Bush, MD regarding development and update of comprehensive plan of care as evidenced by provider attestation and co-signature Inter-disciplinary care team collaboration (see longitudinal plan of care) Comprehensive medication review performed; medication list updated in electronic medical record  Hypertension (BP goal <140/90) -Controlled - per clinic and home readings  -Current treatment: Losartan 100 mg - 1/2 tablet daily -Medications previously tried: Pharmacy was unable to order the losartan 50 mg, so he is taking 1/2 tab 100 mg   -Current home readings: this morning on home monitor - 138/73, 88 -Denies hypotensive/hypertensive symptoms -Educated on BP goals and benefits of medications for prevention of heart attack, stroke and kidney damage; -Counseled to monitor BP at home monthly, document, and provide log at future appointments -Recommended to continue current medication  Hyperlipidemia: (LDL goal < 70) -Controlled - LDL 37 -Current treatment: Atorvastatin 20 mg - 1 tablet daily -Medications previously tried: none  -Current dietary patterns: discussed weight loss strategies, reducing calories - MyFitnessPal App -Current  exercise habits: minimal due to 'bad knees' but tries to stay active -Educated on Cholesterol goals;  -Recommended to continue current medication  Diabetes (A1c goal <7%) -Patient increased metformin 2 weeks ago due to elevated BG. He reports they have improved since then.  -Current medications: Metformin 500 mg - 1 tablet twice daily -Medications previously tried: none   -Current home glucose readings - uses [employer], he also checks with finger sticks on occasion  fasting glucose:  last 7 days - average 138; 14 days - 149, 30 day - 160, 90 day -  148 (trending downward) post prandial glucose: none above 200 in past 7 days, average 168 after breakfast -Denies hypoglycemic/hyperglycemic symptoms - denies any readings < 70 -Current meal patterns: he plans to lose some weight to be ready for right knee replacement if needed. Weight is stable - reports 330 lbs last week at office visit. He is cutting back on food intake. He limits carbs. We discussed reducing caloric intake.  -Educated on A1c and blood sugar goals; -Counseled to check feet daily and get yearly eye exams - up to date -Recommended to continue current medication; Try using an app to track your calorie intake for weight loss.   Patient Goals/Self-Care Activities Patient will:  - take medications as prescribed  Follow Up Plan: Telephone follow up appointment with care management team member scheduled for:  12 months      Medication Assistance: None required.  Patient affirms current coverage meets needs.  Compliance/Adherence/Medication fill history: Care Gaps: Vaccines up to date  Star-Rating Drugs: Refills history is not available in chart   Patient's preferred pharmacy is:  [id number] [place], [address] [place] [address]Phone: [phone]: [phone] [facility]  [address]Phone: [phone]: [phone] [facility] - all other medications   Uses pill box? Yes - works well, uses alarm on phone  Pt endorses 100% compliance - denies missed  doses   We discussed: Current pharmacy is preferred with insurance plan and patient is satisfied with pharmacy services Patient decided to: Continue current medication management strategy  Care Plan and Follow Up Patient Decision:  Patient agrees to Care Plan and Follow-up.  Debbora Dus, PharmD Clinical Pharmacist [facility] at [facility] [phone]

## 2020-08-14 NOTE — Patient Instructions (Signed)
Dear [name],  Below is a summary of the goals we discussed during our follow up appointment on August 14, 2020. Please contact me anytime with questions or concerns.   Visit Information   Goals Addressed   None    Patient Care Plan: CCM Pharmacy Care Plan     Problem Identified: CHL AMB "PATIENT-SPECIFIC PROBLEM"      Long-Range Goal: Disease Management   Start Date: 08/14/2020  Priority: High  Note:   Current Barriers:  None identified  Pharmacist Clinical Goal(s):  Patient will contact provider office for questions/concerns as evidenced notation of same in electronic health record through collaboration with PharmD and provider.   Interventions: 1:1 collaboration with Ria Bush, MD regarding development and update of comprehensive plan of care as evidenced by provider attestation and co-signature Inter-disciplinary care team collaboration (see longitudinal plan of care) Comprehensive medication review performed; medication list updated in electronic medical record  Hypertension (BP goal <140/90) -Controlled - per clinic and home readings  -Current treatment: Losartan 100 mg - 1/2 tablet daily -Medications previously tried: Pharmacy was unable to order the losartan 50 mg, so he is taking 1/2 tab 100 mg   -Current home readings: this morning on home monitor - 138/73, 88 -Denies hypotensive/hypertensive symptoms -Educated on BP goals and benefits of medications for prevention of heart attack, stroke and kidney damage; -Counseled to monitor BP at home monthly, document, and provide log at future appointments -Recommended to continue current medication  Hyperlipidemia: (LDL goal < 70) -Controlled - LDL 37 -Current treatment: Atorvastatin 20 mg - 1 tablet daily -Medications previously tried: none  -Current dietary patterns: discussed weight loss strategies, reducing calories - MyFitnessPal App -Current exercise habits: minimal due to 'bad knees' but tries to  stay active -Educated on Cholesterol goals;  -Recommended to continue current medication  Diabetes (A1c goal <7%) -Patient increased metformin 2 weeks ago due to elevated BG. He reports they have improved since then.  -Current medications: Metformin 500 mg - 1 tablet twice daily -Medications previously tried: none   -Current home glucose readings - uses [employer], he also checks with finger sticks on occasion  fasting glucose:  last 7 days - average 138; 14 days - 149, 30 day - 160, 90 day -  148 (trending downward) post prandial glucose: none above 200 in past 7 days, average 168 after breakfast -Denies hypoglycemic/hyperglycemic symptoms - denies any readings < 70 -Current meal patterns: he plans to lose some weight to be ready for right knee replacement if needed. Weight is stable - reports 330 lbs last week at office visit. He is cutting back on food intake. He limits carbs. We discussed reducing caloric intake.  -Educated on A1c and blood sugar goals; -Counseled to check feet daily and get yearly eye exams - up to date -Recommended to continue current medication; Try using an app to track your calorie intake for weight loss.   Patient Goals/Self-Care Activities Patient will:  - take medications as prescribed  Follow Up Plan: Telephone follow up appointment with care management team member scheduled for:  12 months      Patient verbalizes understanding of instructions provided today and agrees to view in [place].   Debbora Dus, PharmD Clinical Pharmacist [facility] at [facility] [phone]

## 2020-09-23 ENCOUNTER — Ambulatory Visit (INDEPENDENT_AMBULATORY_CARE_PROVIDER_SITE_OTHER): Payer: Medicare Other

## 2020-09-23 VITALS — BP 138/80 | Temp 96.4°F | Wt 330.0 lb

## 2020-09-23 DIAGNOSIS — Z0000 Encounter for general adult medical examination without abnormal findings: Secondary | ICD-10-CM

## 2020-09-23 NOTE — Progress Notes (Signed)
Subjective:   Dennis Ayers is a 70 y.o. male who presents for Medicare Annual/Subsequent preventive examination.  Review of Systems: N/A      I connected with the patient today by telephone and verified that I am speaking with the correct person using two identifiers. Location patient: home Location nurse: work Persons participating in the telephone visit: patient, nurse.   I discussed the limitations, risks, security and privacy concerns of performing an evaluation and management service by telephone and the availability of in person appointments. I also discussed with the patient that there may be a patient responsible charge related to this service. The patient expressed understanding and verbally consented to this telephonic visit.        Cardiac Risk Factors include: advanced age (>27mn, >>73women);male gender     Objective:    Today's Vitals   09/23/20 1311  BP: 138/80  Temp: (!) 96.4 F (35.8 C)  Weight: (!) 330 lb (149.7 kg)   Body mass index is 41.8 kg/m.  Advanced Directives 09/23/2020 10/02/2019 09/25/2018 09/09/2017 10/29/2016 10/21/2016 10/21/2016  Does Patient Have a Medical Advance Directive? Yes Yes Yes Yes Yes Yes Yes  Type of AParamedicof ANakaibitoLiving will HMount OliveLiving will HWhaleyvilleLiving will HSehiliLiving will HHatleyLiving will Living will;Healthcare Power of ALake SherwoodLiving will  Does patient want to make changes to medical advance directive? - - - - - - -  Copy of HShelbyin Chart? Yes - validated most recent copy scanned in chart (See row information) Yes - validated most recent copy scanned in chart (See row information) Yes - validated most recent copy scanned in chart (See row information) Yes No - copy requested No - copy requested No - copy requested  Pre-existing out of facility DNR order  (yellow form or pink MOST form) - - - - - - -    Current Medications (verified) Outpatient Encounter Medications as of 09/23/2020  Medication Sig   acetaminophen (TYLENOL) 500 MG tablet Take 1,000 mg by mouth every 6 (six) hours as needed.   aspirin EC 81 MG tablet Take 81 mg by mouth daily.   atorvastatin (LIPITOR) 20 MG tablet Take 1 tablet (20 mg total) by mouth daily at 6 PM.   Continuous Blood Gluc Sensor (FREESTYLE LIBRE 14 DAY SENSOR) MISC 1 Units by Does not apply route every 14 (fourteen) days.   diphenhydrAMINE HCl, Sleep, 25 MG TBDP Take 25 mg by mouth at bedtime. Takes every night   glucose blood (ONE TOUCH ULTRA TEST) test strip USE AS INSTRUCTED TO CHECK BLOOD SUGAR ONCE A DAY AS NEEDED. DX CODE: E11.9   Krill Oil 300 MG CAPS Take 300 mg by mouth daily.   losartan (COZAAR) 100 MG tablet Take 0.5 tablets (50 mg total) by mouth daily.   metFORMIN (GLUCOPHAGE) 500 MG tablet Take 1 tablet (500 mg total) by mouth 2 (two) times daily with a meal.   Misc Natural Products (OSTEO BI-FLEX JOINT SHIELD PO) Take 1 capsule by mouth daily.    Multiple Vitamin (MULTIVITAMIN) tablet Take 1 tablet by mouth daily.   OneTouch Delica Lancets [id number] Use as directed to check blood sugar once daily as needed.  Dx code: E11.9   No facility-administered encounter medications on file as of 09/23/2020.    Allergies (verified) Lisinopril   History: Past Medical History:  Diagnosis Date   Benign  neoplasm of cecum - 25 mm polyp 08/06/2015   Coronary artery calcification    Diabetes mellitus without complication (HCC)    DJD (degenerative joint disease) of knee    [place] steroid injection (01/2014)   Family history of ovarian cancer    Hyperglycemia    Hyperlipidemia    Hypertension    Jaundice    with icterus, when very young, treated and resolved   Knee pain    Morbid obesity ([place])    OSA (obstructive sleep apnea)    mild to mod, working on weight loss, no cpap needed per test    Osteoarthritis of knee 07/23/2011   blat s/p L TKR ([place])   Redundant colon 11/16/2017   Severe obesity (BMI >= 40) ([place]) 02/15/2008   Sleep apnea    does not have a cpap   Viral hepatitis 8th grade   has had jaundice in past, unsure of cause ?Hep A   Past Surgical History:  Procedure Laterality Date   CHOLECYSTECTOMY  1998   COLONOSCOPY  08/20/2011   4 adenomatous polyps - rpt 3 yrs Gatha Mayer)   COLONOSCOPY  07-22-15   mult TAs, severe diverticulosis, one large 2.5cm cecal polyp pending resection Carlean Purl)   COLONOSCOPY N/A 09/10/2015   large TA, rpt 1 yr Gatha Mayer, MD)   COLONOSCOPY  11/2017   7 TAs, 3 SSPs, diverticulosis, rpt 2 yrs Carlean Purl)   COLONOSCOPY WITH PROPOFOL N/A 10/29/2016   TA, rpt 1 yr Carlean Purl, Ofilia Neas, MD)   EYE SURGERY Right    laser   HOT HEMOSTASIS N/A 10/29/2016   Procedure: HOT HEMOSTASIS (ARGON PLASMA COAGULATION/BICAP);  Surgeon: Gatha Mayer, MD;  Location: [facility];  Service: Endoscopy;  Laterality: N/A;   TONSILLECTOMY     TOTAL KNEE ARTHROPLASTY Left 07/25/2012   Surgeon: Johnny Bridge, MD   Varus Gonarthrosis  04/02/02   Dr. Alphonzo Severance   Family History  Problem Relation Age of Onset   Ovarian cancer Mother    Ulcers Mother        stomach   Coronary artery disease Father 62       CABG X4   Hypertension Father    Stroke Father 34   Heart attack Father 80   Heart attack Sister 72   Heart Problems Paternal Aunt    Heart attack Paternal Uncle    Heart attack Paternal Aunt    Heart attack Paternal Uncle    Diabetes Neg Hx    Prostate cancer Neg Hx    Breast cancer Neg Hx    Colon cancer Neg Hx    Depression Neg Hx    Alcohol abuse Neg Hx    Colon polyps Neg Hx    Stomach cancer Neg Hx    Rectal cancer Neg Hx    Social History   Socioeconomic History   Marital status: Married    Spouse name: Not on file   Number of children: 1   Years of education: Not on file   Highest education level: Not on file  Occupational History    Occupation: Self-employed  Tobacco Use   Smoking status: Never   Smokeless tobacco: Never  Vaping Use   Vaping Use: Never used  Substance and Sexual Activity   Alcohol use: No   Drug use: No   Sexual activity: Not Currently  Other Topics Concern   Not on file  Social History Narrative   Caffeine: 2-3 diet sodas/day   Lives with  wife   1 grown son.   Occupation: Self-employed; Psychologist, forensic   Activity: some golf, limited by knee pain   Diet: good water, daily vegetables   Social Determinants of Health   Financial Resource Strain: Low Risk    Difficulty of Paying Living Expenses: Not hard at all  Food Insecurity: No Food Insecurity   Worried About Charity fundraiser in the Last Year: Never true   Arboriculturist in the Last Year: Never true  Transportation Needs: No Transportation Needs   Lack of Transportation (Medical): No   Lack of Transportation (Non-Medical): No  Physical Activity: Inactive   Days of Exercise per Week: 0 days   Minutes of Exercise per Session: 0 min  Stress: No Stress Concern Present   Feeling of Stress : Not at all  Social Connections: Not on file    Tobacco Counseling Counseling given: Not Answered   Clinical Intake:  Pre-visit preparation completed: Yes  Pain : No/denies pain     Nutritional Risks: None Diabetes: Yes CBG done?: No Did pt. bring in CBG monitor from home?: No  How often do you need to have someone help you when you read instructions, pamphlets, or other written materials from your doctor or pharmacy?: 1 - Never  Diabetic: Yes Nutrition Risk Assessment:  Has the patient had any N/V/D within the last 2 months?  No  Does the patient have any non-healing wounds?  No  Has the patient had any unintentional weight loss or weight gain?  No   Diabetes:  Is the patient diabetic?  Yes  If diabetic, was a CBG obtained today?  No  telephone visit  Did the patient bring in their glucometer from  home?  No  telephone visit  How often do you monitor your CBG's? daily.   Financial Strains and Diabetes Management:  Are you having any financial strains with the device, your supplies or your medication? No .  Does the patient want to be seen by Chronic Care Management for management of their diabetes?  No  Would the patient like to be referred to a Nutritionist or for Diabetic Management?  No   Diabetic Exams:  Diabetic Eye Exam: Completed 07/15/2020 Diabetic Foot Exam: Completed 04/07/2020   Interpreter Needed?: No  Information entered by :: CJohnson, RN   Activities of Daily Living In your present state of health, do you have any difficulty performing the following activities: 09/23/2020 10/02/2019  Hearing? N N  Vision? N N  Difficulty concentrating or making decisions? N N  Walking or climbing stairs? N N  Dressing or bathing? N N  Doing errands, shopping? N N  Preparing Food and eating ? N N  Using the Toilet? N N  In the past six months, have you accidently leaked urine? N N  Do you have problems with loss of bowel control? N N  Managing your Medications? N N  Managing your Finances? N N  Housekeeping or managing your Housekeeping? N N  Some recent data might be hidden    Patient Care Team: Ria Bush, MD as PCP - General (Family Medicine) End, Harrell Gave, MD as PCP - Cardiology (Cardiology) Marchia Bond, MD as Consulting Physician (Orthopedic Surgery) End, Harrell Gave, MD as Consulting Physician (Cardiology) Gatha Mayer, MD as Consulting Physician (Gastroenterology) Macarthur Critchley, [place] as Consulting Physician (Optometry) Debbora Dus, [facility] as Pharmacist (Pharmacist)  Indicate any recent Medical Services you may have received from other than [facility] providers in  the past year (date may be approximate).     Assessment:   This is a routine wellness examination for [place].  Hearing/Vision screen Vision Screening - Comments:: Patient gets annual eye  exams   Dietary issues and exercise activities discussed: Current Exercise Habits: The patient does not participate in regular exercise at present, Exercise limited by: None identified   Goals Addressed             This Visit's Progress    Patient Stated       09/23/2020, I will maintain and continue medications prescribed.        Depression Screen PHQ 2/9 Scores 09/23/2020 10/02/2019 09/25/2018 09/09/2017 09/03/2016 07/10/2015  PHQ - 2 Score 0 0 0 0 0 0  PHQ- 9 Score 0 0 1 0 - -    Fall Risk Fall Risk  09/23/2020 10/02/2019 09/25/2018 09/09/2017 09/03/2016  Falls in the past year? 0 0 1 No No  Number falls in past yr: 0 0 - - -  Injury with Fall? 0 0 0 - -  Comment - - lost balance - -  Risk for fall due to : Medication side effect Medication side effect History of fall(s);Medication side effect - -  Follow up Falls evaluation completed;Falls prevention discussed Falls evaluation completed;Falls prevention discussed Falls evaluation completed;Falls prevention discussed - -    FALL RISK PREVENTION PERTAINING TO THE HOME:  Any stairs in or around the home? Yes  If so, are there any without handrails? No  Home free of loose throw rugs in walkways, pet beds, electrical cords, etc? Yes  Adequate lighting in your home to reduce risk of falls? Yes   ASSISTIVE DEVICES UTILIZED TO PREVENT FALLS:  Life alert? No  Use of a cane, walker or w/c? Yes  Grab bars in the bathroom? No  Shower chair or bench in shower? No  Elevated toilet seat or a handicapped toilet? No   TIMED UP AND GO:  Was the test performed?  N/A telephone visit .    Cognitive Function: MMSE - Mini Mental State Exam 09/23/2020 10/02/2019 09/25/2018 09/09/2017 09/03/2016  Orientation to time '5 5 5 5 5  '$ Orientation to Place '5 5 5 5 5  '$ Registration '3 3 3 3 3  '$ Attention/ Calculation '5 5 5 '$ 0 0  Recall '3 3 3 3 3  '$ Language- name 2 objects - - 0 0 0  Language- repeat '1 1 1 1 1  '$ Language- follow 3 step command - - 0 3 3  Language-  read & follow direction - - 0 0 0  Write a sentence - - 0 0 0  Copy design - - 0 0 0  Total score - - '22 20 20  '$ Mini Cog  Mini-Cog screen was completed. Maximum score is 22. A value of 0 denotes this part of the MMSE was not completed or the patient failed this part of the Mini-Cog screening.       Immunizations Immunization History  Administered Date(s) Administered   Hepatitis B, adult 03/11/2017, 06/14/2017, 10/18/2017   Influenza Whole 11/23/2011   Influenza, High Dose Seasonal PF 11/03/2014, 11/21/2015, 10/01/2016, 10/20/2018, 11/01/2019, 11/02/2019   Influenza-Unspecified 09/30/2017   PFIZER Comirnaty(Gray Top)Covid-19 Tri-Sucrose Vaccine 05/23/2020   PFIZER(Purple Top)SARS-COV-2 Vaccination 04/02/2019, 04/25/2019, 11/22/2019, 05/23/2020   PNEUMOCOCCAL CONJUGATE-20 08/07/2020   Pneumococcal Conjugate-13 07/10/2015   Pneumococcal Polysaccharide-23 11/21/2015   Td 03/23/2002   Tdap 07/10/2015   Zoster Recombinat (Shingrix) 11/18/2017, 02/22/2018   Zoster, Live 07/23/2011  TDAP status: Up to date  Flu Vaccine status: Up to date  Pneumococcal vaccine status: Up to date  Covid-19 vaccine status: Completed vaccines  Qualifies for Shingles Vaccine? Yes   Zostavax completed Yes   Shingrix Completed?: Yes  Screening Tests Health Maintenance  Topic Date Due   COVID-19 Vaccine (5 - Booster for Pfizer series) 09/22/2020   INFLUENZA VACCINE  09/22/2020   HEMOGLOBIN A1C  10/05/2020   COLONOSCOPY (Pts 45-49yr Insurance coverage will need to be confirmed)  01/20/2021   FOOT EXAM  04/07/2021   OPHTHALMOLOGY EXAM  07/15/2021   TETANUS/TDAP  07/09/2025   Hepatitis C Screening  Completed   PNA vac Low Risk Adult  Completed   Zoster Vaccines- Shingrix  Completed   HPV VACCINES  Aged Out    Health Maintenance  Health Maintenance Due  Topic Date Due   COVID-19 Vaccine (5 - Booster for Pfizer series) 09/22/2020   INFLUENZA VACCINE  09/22/2020    Colorectal cancer  screening: Type of screening: Colonoscopy. Completed 01/21/2020. Repeat every 1 years  Lung Cancer Screening: (Low Dose CT Chest recommended if Age 71-80years, 30 pack-year currently smoking OR have quit w/in 15 years.) does not qualify.   Additional Screening:  Hepatitis C Screening: does qualify; Completed 06/27/2015  Vision Screening: Recommended annual ophthalmology exams for early detection of glaucoma and other disorders of the eye. Is the patient up to date with their annual eye exam?  Yes  Who is the provider or what is the name of the office in which the patient attends annual eye exams? Dr. JMacarthur CritchleyIf pt is not established with a provider, would they like to be referred to a provider to establish care? No .   Dental Screening: Recommended annual dental exams for proper oral hygiene  Community Resource Referral / Chronic Care Management: CRR required this visit?  No   CCM required this visit?  No      Plan:     I have personally reviewed and noted the following in the patient's chart:   Medical and social history Use of alcohol, tobacco or illicit drugs  Current medications and supplements including opioid prescriptions. Patient is not currently taking opioid prescriptions. Functional ability and status Nutritional status Physical activity Advanced directives List of other physicians Hospitalizations, surgeries, and ER visits in previous 12 months Vitals Screenings to include cognitive, depression, and falls Referrals and appointments  In addition, I have reviewed and discussed with patient certain preventive protocols, quality metrics, and best practice recommendations. A written personalized care plan for preventive services as well as general preventive health recommendations were provided to patient.   Due to this being a telephonic visit, the after visit summary with patients personalized plan was offered to patient via office or my-chart. Patient preferred to  pick up at office at next visit or via mychart.   JAndrez Grime LPN   [id number]

## 2020-09-23 NOTE — Patient Instructions (Signed)
Dennis Ayers , Thank you for taking time to come for your Medicare Wellness Visit. I appreciate your ongoing commitment to your health goals. Please review the following plan we discussed and let me know if I can assist you in the future.   Screening recommendations/referrals: Colonoscopy: Up to date, completed 01/21/2020, due 12/2020 Recommended yearly ophthalmology/optometry visit for glaucoma screening and checkup Recommended yearly dental visit for hygiene and checkup  Vaccinations: Influenza vaccine: Up to date, completed 11/02/2019, due Fall 2022 Pneumococcal vaccine: Completed series Tdap vaccine: Up to date, completed 07/10/2015, due 06/2025 Shingles vaccine: Completed series   Covid-19: Completed series  Advanced directives: copy in chart  Conditions/risks identified: diabetes, hyperlipidemia   Next appointment: Follow up in one year for your annual wellness visit.   Preventive Care 8 Years and Older, Male Preventive care refers to lifestyle choices and visits with your health care provider that can promote health and wellness. What does preventive care include? A yearly physical exam. This is also called an annual well check. Dental exams once or twice a year. Routine eye exams. Ask your health care provider how often you should have your eyes checked. Personal lifestyle choices, including: Daily care of your teeth and gums. Regular physical activity. Eating a healthy diet. Avoiding tobacco and drug use. Limiting alcohol use. Practicing safe sex. Taking low doses of aspirin every day. Taking vitamin and mineral supplements as recommended by your health care provider. What happens during an annual well check? The services and screenings done by your health care provider during your annual well check will depend on your age, overall health, lifestyle risk factors, and family history of disease. Counseling  Your health care provider may ask you questions about your: Alcohol  use. Tobacco use. Drug use. Emotional well-being. Home and relationship well-being. Sexual activity. Eating habits. History of falls. Memory and ability to understand (cognition). Work and work Statistician. Screening  You may have the following tests or measurements: Height, weight, and BMI. Blood pressure. Lipid and cholesterol levels. These may be checked every 5 years, or more frequently if you are over 46 years old. Skin check. Lung cancer screening. You may have this screening every year starting at age 38 if you have a 30-pack-year history of smoking and currently smoke or have quit within the past 15 years. Fecal occult blood test (FOBT) of the stool. You may have this test every year starting at age 36. Flexible sigmoidoscopy or colonoscopy. You may have a sigmoidoscopy every 5 years or a colonoscopy every 10 years starting at age 65. Prostate cancer screening. Recommendations will vary depending on your family history and other risks. Hepatitis C blood test. Hepatitis B blood test. Sexually transmitted disease (STD) testing. Diabetes screening. This is done by checking your blood sugar (glucose) after you have not eaten for a while (fasting). You may have this done every 1-3 years. Abdominal aortic aneurysm (AAA) screening. You may need this if you are a current or former smoker. Osteoporosis. You may be screened starting at age 70 if you are at high risk. Talk with your health care provider about your test results, treatment options, and if necessary, the need for more tests. Vaccines  Your health care provider may recommend certain vaccines, such as: Influenza vaccine. This is recommended every year. Tetanus, diphtheria, and acellular pertussis (Tdap, Td) vaccine. You may need a Td booster every 10 years. Zoster vaccine. You may need this after age 55. Pneumococcal 13-valent conjugate (PCV13) vaccine. One dose is recommended  after age 52. Pneumococcal polysaccharide  (PPSV23) vaccine. One dose is recommended after age 35. Talk to your health care provider about which screenings and vaccines you need and how often you need them. This information is not intended to replace advice given to you by your health care provider. Make sure you discuss any questions you have with your health care provider. Document Released: 03/07/2015 Document Revised: 10/29/2015 Document Reviewed: 12/10/2014 Elsevier Interactive Patient Education  2017 [employer] Prevention in the Home Falls can cause injuries. They can happen to people of all ages. There are many things you can do to make your home safe and to help prevent falls. What can I do on the outside of my home? Regularly fix the edges of walkways and driveways and fix any cracks. Remove anything that might make you trip as you walk through a door, such as a raised step or threshold. Trim any bushes or trees on the path to your home. Use bright outdoor lighting. Clear any walking paths of anything that might make someone trip, such as rocks or tools. Regularly check to see if handrails are loose or broken. Make sure that both sides of any steps have handrails. Any raised decks and porches should have guardrails on the edges. Have any leaves, snow, or ice cleared regularly. Use sand or salt on walking paths during winter. Clean up any spills in your garage right away. This includes oil or grease spills. What can I do in the bathroom? Use night lights. Install grab bars by the toilet and in the tub and shower. Do not use towel bars as grab bars. Use non-skid mats or decals in the tub or shower. If you need to sit down in the shower, use a plastic, non-slip stool. Keep the floor dry. Clean up any water that spills on the floor as soon as it happens. Remove soap buildup in the tub or shower regularly. Attach bath mats securely with double-sided non-slip rug tape. Do not have throw rugs and other things on the  floor that can make you trip. What can I do in the bedroom? Use night lights. Make sure that you have a light by your bed that is easy to reach. Do not use any sheets or blankets that are too big for your bed. They should not hang down onto the floor. Have a firm chair that has side arms. You can use this for support while you get dressed. Do not have throw rugs and other things on the floor that can make you trip. What can I do in the kitchen? Clean up any spills right away. Avoid walking on wet floors. Keep items that you use a lot in easy-to-reach places. If you need to reach something above you, use a strong step stool that has a grab bar. Keep electrical cords out of the way. Do not use floor polish or wax that makes floors slippery. If you must use wax, use non-skid floor wax. Do not have throw rugs and other things on the floor that can make you trip. What can I do with my stairs? Do not leave any items on the stairs. Make sure that there are handrails on both sides of the stairs and use them. Fix handrails that are broken or loose. Make sure that handrails are as long as the stairways. Check any carpeting to make sure that it is firmly attached to the stairs. Fix any carpet that is loose or worn. Avoid having throw  rugs at the top or bottom of the stairs. If you do have throw rugs, attach them to the floor with carpet tape. Make sure that you have a light switch at the top of the stairs and the bottom of the stairs. If you do not have them, ask someone to add them for you. What else can I do to help prevent falls? Wear shoes that: Do not have high heels. Have rubber bottoms. Are comfortable and fit you well. Are closed at the toe. Do not wear sandals. If you use a stepladder: Make sure that it is fully opened. Do not climb a closed stepladder. Make sure that both sides of the stepladder are locked into place. Ask someone to hold it for you, if possible. Clearly mark and make  sure that you can see: Any grab bars or handrails. First and last steps. Where the edge of each step is. Use tools that help you move around (mobility aids) if they are needed. These include: Canes. Walkers. Scooters. Crutches. Turn on the lights when you go into a dark area. Replace any light bulbs as soon as they burn out. Set up your furniture so you have a clear path. Avoid moving your furniture around. If any of your floors are uneven, fix them. If there are any pets around you, be aware of where they are. Review your medicines with your doctor. Some medicines can make you feel dizzy. This can increase your chance of falling. Ask your doctor what other things that you can do to help prevent falls. This information is not intended to replace advice given to you by your health care provider. Make sure you discuss any questions you have with your health care provider. Document Released: 12/05/2008 Document Revised: 07/17/2015 Document Reviewed: 03/15/2014 Elsevier Interactive Patient Education  2017 [employer].

## 2020-09-23 NOTE — Progress Notes (Signed)
PCP notes:  Health Maintenance: No gaps noted   Abnormal Screenings: none   Patient concerns: none   Nurse concerns: none   Next PCP appt.: 10/08/2020 @ 8:30 am

## 2020-09-29 ENCOUNTER — Other Ambulatory Visit: Payer: Self-pay | Admitting: Family Medicine

## 2020-09-29 DIAGNOSIS — Z125 Encounter for screening for malignant neoplasm of prostate: Secondary | ICD-10-CM

## 2020-09-29 DIAGNOSIS — E118 Type 2 diabetes mellitus with unspecified complications: Secondary | ICD-10-CM

## 2020-09-29 DIAGNOSIS — E1169 Type 2 diabetes mellitus with other specified complication: Secondary | ICD-10-CM

## 2020-09-29 DIAGNOSIS — E785 Hyperlipidemia, unspecified: Secondary | ICD-10-CM

## 2020-09-29 DIAGNOSIS — D751 Secondary polycythemia: Secondary | ICD-10-CM

## 2020-10-01 ENCOUNTER — Other Ambulatory Visit: Payer: Self-pay

## 2020-10-01 ENCOUNTER — Other Ambulatory Visit (INDEPENDENT_AMBULATORY_CARE_PROVIDER_SITE_OTHER): Payer: Medicare Other

## 2020-10-01 DIAGNOSIS — Z125 Encounter for screening for malignant neoplasm of prostate: Secondary | ICD-10-CM | POA: Diagnosis not present

## 2020-10-01 DIAGNOSIS — E1169 Type 2 diabetes mellitus with other specified complication: Secondary | ICD-10-CM | POA: Diagnosis not present

## 2020-10-01 DIAGNOSIS — E118 Type 2 diabetes mellitus with unspecified complications: Secondary | ICD-10-CM | POA: Diagnosis not present

## 2020-10-01 DIAGNOSIS — E785 Hyperlipidemia, unspecified: Secondary | ICD-10-CM | POA: Diagnosis not present

## 2020-10-01 DIAGNOSIS — D751 Secondary polycythemia: Secondary | ICD-10-CM | POA: Diagnosis not present

## 2020-10-01 LAB — COMPREHENSIVE METABOLIC PANEL
ALT: 23 U/L (ref 0–53)
AST: 16 U/L (ref 0–37)
Albumin: 4.3 g/dL (ref 3.5–5.2)
Alkaline Phosphatase: 58 U/L (ref 39–117)
BUN: 21 mg/dL (ref 6–23)
CO2: 23 mEq/L (ref 19–32)
Calcium: 9.4 mg/dL (ref 8.4–10.5)
Chloride: 103 mEq/L (ref 96–112)
Creatinine, Ser: 0.99 mg/dL (ref 0.40–1.50)
GFR: 76.96 mL/min (ref >60.00)
Glucose, Bld: 151 mg/dL — ABNORMAL HIGH (ref 70–99)
Potassium: 4.4 mEq/L (ref 3.5–5.1)
Sodium: 139 mEq/L (ref 135–145)
Total Bilirubin: 0.7 mg/dL (ref 0.2–1.2)
Total Protein: 6.9 g/dL (ref 6.0–8.3)

## 2020-10-01 LAB — HEMOGLOBIN A1C: Hgb A1c MFr Bld: 6.4 % (ref 4.6–6.5)

## 2020-10-01 LAB — LIPID PANEL
Cholesterol: 111 mg/dL (ref 0–200)
HDL: 50.5 mg/dL (ref >39.00)
LDL Cholesterol: 46 mg/dL (ref 0–99)
NonHDL: 60.01
Total CHOL/HDL Ratio: 2
Triglycerides: 72 mg/dL (ref 0.0–149.0)
VLDL: 14.4 mg/dL (ref 0.0–40.0)

## 2020-10-01 LAB — CBC WITH DIFFERENTIAL/PLATELET
Basophils Absolute: 0 10*3/uL (ref 0.0–0.1)
Basophils Relative: 1 % (ref 0.0–3.0)
Eosinophils Absolute: 0.1 10*3/uL (ref 0.0–0.7)
Eosinophils Relative: 3 % (ref 0.0–5.0)
HCT: 46.8 % (ref 39.0–52.0)
Hemoglobin: 16 g/dL (ref 13.0–17.0)
Lymphocytes Relative: 32.8 % (ref 12.0–46.0)
Lymphs Abs: 1.5 10*3/uL (ref 0.7–4.0)
MCHC: 34.2 g/dL (ref 30.0–36.0)
MCV: 93.6 fl (ref 78.0–100.0)
Monocytes Absolute: 0.3 10*3/uL (ref 0.1–1.0)
Monocytes Relative: 7.5 % (ref 3.0–12.0)
Neutro Abs: 2.5 10*3/uL (ref 1.4–7.7)
Neutrophils Relative %: 55.7 % (ref 43.0–77.0)
Platelets: 208 10*3/uL (ref 150.0–400.0)
RBC: 5 Mil/uL (ref 4.22–5.81)
RDW: 12.8 % (ref 11.5–15.5)
WBC: 4.5 10*3/uL (ref 4.0–10.5)

## 2020-10-01 LAB — PSA: PSA: 2.37 ng/mL (ref 0.10–4.00)

## 2020-10-02 ENCOUNTER — Ambulatory Visit: Payer: Medicare Other

## 2020-10-02 ENCOUNTER — Telehealth: Payer: Medicare Other

## 2020-10-08 ENCOUNTER — Encounter: Payer: Self-pay | Admitting: Family Medicine

## 2020-10-08 ENCOUNTER — Other Ambulatory Visit: Payer: Self-pay

## 2020-10-08 ENCOUNTER — Ambulatory Visit (INDEPENDENT_AMBULATORY_CARE_PROVIDER_SITE_OTHER): Payer: Medicare Other | Admitting: Family Medicine

## 2020-10-08 VITALS — BP 136/78 | HR 97 | Temp 97.7°F | Ht 73.5 in | Wt 327.1 lb

## 2020-10-08 DIAGNOSIS — E1169 Type 2 diabetes mellitus with other specified complication: Secondary | ICD-10-CM

## 2020-10-08 DIAGNOSIS — Z0000 Encounter for general adult medical examination without abnormal findings: Secondary | ICD-10-CM | POA: Diagnosis not present

## 2020-10-08 DIAGNOSIS — Z86010 Personal history of colonic polyps: Secondary | ICD-10-CM | POA: Diagnosis not present

## 2020-10-08 DIAGNOSIS — I2510 Atherosclerotic heart disease of native coronary artery without angina pectoris: Secondary | ICD-10-CM | POA: Diagnosis not present

## 2020-10-08 DIAGNOSIS — E118 Type 2 diabetes mellitus with unspecified complications: Secondary | ICD-10-CM

## 2020-10-08 DIAGNOSIS — I77810 Thoracic aortic ectasia: Secondary | ICD-10-CM

## 2020-10-08 DIAGNOSIS — M170 Bilateral primary osteoarthritis of knee: Secondary | ICD-10-CM | POA: Diagnosis not present

## 2020-10-08 DIAGNOSIS — E785 Hyperlipidemia, unspecified: Secondary | ICD-10-CM | POA: Diagnosis not present

## 2020-10-08 DIAGNOSIS — I10 Essential (primary) hypertension: Secondary | ICD-10-CM | POA: Diagnosis not present

## 2020-10-08 DIAGNOSIS — I2584 Coronary atherosclerosis due to calcified coronary lesion: Secondary | ICD-10-CM

## 2020-10-08 MED ORDER — FREESTYLE LIBRE 14 DAY SENSOR MISC: 1.0000 [IU] | 3 refills | Status: DC

## 2020-10-08 MED ORDER — ATORVASTATIN CALCIUM 20 MG PO TABS: 20.0000 mg | ORAL_TABLET | Freq: Every day | ORAL | 3 refills | Status: DC

## 2020-10-08 MED ORDER — METFORMIN HCL 500 MG PO TABS: 1000.0000 mg | ORAL_TABLET | Freq: Every day | ORAL | 3 refills | Status: DC

## 2020-10-08 MED ORDER — LOSARTAN POTASSIUM 50 MG PO TABS: 50.0000 mg | ORAL_TABLET | Freq: Every day | ORAL | 3 refills | Status: DC

## 2020-10-08 NOTE — Assessment & Plan Note (Signed)
Chronic, well controlled on atorvastatin The ASCVD Risk score Mikey Bussing DC Jr., et al., 2013) failed to calculate for the following reasons:   The valid total cholesterol range is 130 to 320 mg/dL

## 2020-10-08 NOTE — Assessment & Plan Note (Addendum)
4cm by echo 2018.  May be overdue for recheck. Due for cardiology f/u 12/2020.

## 2020-10-08 NOTE — Assessment & Plan Note (Signed)
S/p L knee replacement, considering R side but needs to lose weight.

## 2020-10-08 NOTE — Assessment & Plan Note (Signed)
Encouraged ongoing healthy diet and lifestyle choices to affect sustainable weight loss.  

## 2020-10-08 NOTE — Assessment & Plan Note (Signed)
Chronic, stable on losartan '50mg'$  daily - continue.

## 2020-10-08 NOTE — Assessment & Plan Note (Signed)
Appreciate GI care of patient, currently on yearly colonoscopy schedule

## 2020-10-08 NOTE — Patient Instructions (Signed)
You are doing well today  Change metformin to night time dosing.  Return as needed or in 6 months for diabetes follow up visit.   Health Maintenance After Age 71 After age 53, you are at a higher risk for certain long-term diseases and infections as well as injuries from falls. Falls are a major cause of broken bones and head injuries in people who are older than age 10. Getting regular preventive care can help to keep you healthy and well. Preventive care includes getting regular testing and making lifestyle changes as recommended by your health care provider. Talk with your health care provider about: Which screenings and tests you should have. A screening is a test that checks for a disease when you have no symptoms. A diet and exercise plan that is right for you. What should I know about screenings and tests to prevent falls? Screening and testing are the best ways to find a health problem early. Early diagnosis and treatment give you the best chance of managing medical conditions that are common after age 48. Certain conditions and lifestyle choices may make you more likely to have a fall. Your health care provider may recommend: Regular vision checks. Poor vision and conditions such as cataracts can make you more likely to have a fall. If you wear glasses, make sure to get your prescription updated if your vision changes. Medicine review. Work with your health care provider to regularly review all of the medicines you are taking, including over-the-counter medicines. Ask your health care provider about any side effects that may make you more likely to have a fall. Tell your health care provider if any medicines that you take make you feel dizzy or sleepy. Osteoporosis screening. Osteoporosis is a condition that causes the bones to get weaker. This can make the bones weak and cause them to break more easily. Blood pressure screening. Blood pressure changes and medicines to control blood pressure  can make you feel dizzy. Strength and balance checks. Your health care provider may recommend certain tests to check your strength and balance while standing, walking, or changing positions. Foot health exam. Foot pain and numbness, as well as not wearing proper footwear, can make you more likely to have a fall. Depression screening. You may be more likely to have a fall if you have a fear of falling, feel emotionally low, or feel unable to do activities that you used to do. Alcohol use screening. Using too much alcohol can affect your balance and may make you more likely to have a fall. What actions can I take to lower my risk of falls? General instructions Talk with your health care provider about your risks for falling. Tell your health care provider if: You fall. Be sure to tell your health care provider about all falls, even ones that seem minor. You feel dizzy, sleepy, or off-balance. Take over-the-counter and prescription medicines only as told by your health care provider. These include any supplements. Eat a healthy diet and maintain a healthy weight. A healthy diet includes low-fat dairy products, low-fat (lean) meats, and fiber from whole grains, beans, and lots of fruits and vegetables. Home safety Remove any tripping hazards, such as rugs, cords, and clutter. Install safety equipment such as grab bars in bathrooms and safety rails on stairs. Keep rooms and walkways well-lit. Activity  Follow a regular exercise program to stay fit. This will help you maintain your balance. Ask your health care provider what types of exercise are appropriate for  you. If you need a cane or walker, use it as recommended by your health care provider. Wear supportive shoes that have nonskid soles.  Lifestyle Do not drink alcohol if your health care provider tells you not to drink. If you drink alcohol, limit how much you have: 0-1 drink a day for women. 0-2 drinks a day for men. Be aware of how much  alcohol is in your drink. In the [place] one drink equals one typical bottle of beer (12 oz), one-half glass of wine (5 oz), or one shot of hard liquor (1 oz). Do not use any products that contain nicotine or tobacco, such as cigarettes and e-cigarettes. If you need help quitting, ask your health care provider. Summary Having a healthy lifestyle and getting preventive care can help to protect your health and wellness after age 2. Screening and testing are the best way to find a health problem early and help you avoid having a fall. Early diagnosis and treatment give you the best chance for managing medical conditions that are more common for people who are older than age 71. Falls are a major cause of broken bones and head injuries in people who are older than age 52. Take precautions to prevent a fall at home. Work with your health care provider to learn what changes you can make to improve your health and wellness and to prevent falls. This information is not intended to replace advice given to you by your health care provider. Make sure you discuss any questions you have with your healthcare provider. Document Revised: 01/25/2020 Document Reviewed: 01/25/2020 Elsevier Patient Education  2022 [employer].

## 2020-10-08 NOTE — Assessment & Plan Note (Signed)
Continue aspirin, atorvastatin.   

## 2020-10-08 NOTE — Assessment & Plan Note (Signed)
Chronic, well controlled based on cbg's he brings from [facility] (reviewed) and recent A1c. Notes highest sugars in the morning - will try taking all metformin at night ('1000mg'$ ).

## 2020-10-08 NOTE — Assessment & Plan Note (Signed)
Preventative protocols reviewed and updated unless pt declined. Discussed healthy diet and lifestyle.  

## 2020-10-08 NOTE — Progress Notes (Signed)
Patient ID: Dennis Ayers, male    DOB: 31-Aug-1950, 70 y.o.   MRN: [id number]  This visit was conducted in person.  BP 136/78   Pulse 97   Temp 97.7 F (36.5 C) (Temporal)   Ht 6' 1.5" (1.867 m)   Wt (!) 327 lb 2 oz (148.4 kg)   SpO2 97%   BMI 42.57 kg/m   BP Readings from Last 3 Encounters:  10/08/20 136/78  09/23/20 138/80  04/07/20 140/80   Pulse Readings from Last 3 Encounters:  10/08/20 97  04/07/20 87  01/21/20 81    CC: CPE Subjective:   HPI: Dennis Ayers is a 70 y.o. male presenting on 10/08/2020 for Annual Exam (Prt 2.  Wants to discuss changing losartan to 50 mg tab because 100 mg tabs are difficult to cut. )   Saw health advisor 2 weeks ago for medicare wellness visit. Note reviewed.    No results found.  Flowsheet Row Clinical Support from 09/23/2020 in [facility] at [facility]  PHQ-2 Total Score 0       Fall Risk  09/23/2020 10/02/2019 09/25/2018 09/09/2017 09/03/2016  Falls in the past year? 0 0 1 No No  Number falls in past yr: 0 0 - - -  Injury with Fall? 0 0 0 - -  Comment - - lost balance - -  Risk for fall due to : Medication side effect Medication side effect History of fall(s);Medication side effect - -  Follow up Falls evaluation completed;Falls prevention discussed Falls evaluation completed;Falls prevention discussed Falls evaluation completed;Falls prevention discussed - -   Looking into R knee replacement with ortho - told needed to drop to below 300lbs prior to considering surgery. H/o L knee replacement.   Preventative: COLONOSCOPY Date: 06/2015 mult TAs, severe diverticulosis, 1 large 2.5cm cecal polyp pending resection Carlean Purl) - 08/2015 large TA, rpt 1 yr Carlean Purl) COLONOSCOPY WITH PROPOFOL 10/29/2016 TA, rpt 1 yr Carlean Purl, Ofilia Neas, MD) COLONOSCOPY 11/2017 - 7 TAs, 3 SSPs, diverticulosis, rpt 2 yrs Carlean Purl) Colonoscopy 12/2019 - multiple TA and SSP, diverticulosis with excess colon looping, rpt 1 year ([facility])  Prostate  cancer screening - discussed. Continue screening.  Lung cancer screening - never smoker  AAA screen - never smoker, no fmhx AAA  Flu shot - yearly at [facility] 03/2019, 04/2019, booster 10/2019, 05/2020  Td 2004, Tdap 06/2015  Prevnar-13 06/2015, pneumovax 10/2015. Prevnar-20 07/2020 Zostavax - 2013 Shingrix - 10/2017, 02/2018 Hep B - completed  Advanced directive discussion - has at home. Copy in chart 08/2015. HCPOA is wife and son. No prolonged life support if terminal illness.  Seat belt use discussed  Sunscreen use discussed, no changing moles on skin.  Non smoker Alcohol - none Dentist - Q6 mo Eye exam - yearly  Bowel - no constipation Bladder - no incontinence    Caffeine: 2-3 diet sodas/day Lives with wife 1 grown son. Occupation: Self-employed; Psychologist, forensic Activity: some golf, limited by knee pain  Diet: good water, daily vegetables      Relevant past medical, surgical, family and social history reviewed and updated as indicated. Interim medical history since our last visit reviewed. Allergies and medications reviewed and updated. Outpatient Medications Prior to Visit  Medication Sig Dispense Refill   acetaminophen (TYLENOL) 500 MG tablet Take 1,000 mg by mouth every 6 (six) hours as needed.     aspirin EC 81 MG tablet Take 81 mg by mouth daily.  diphenhydrAMINE HCl, Sleep, 25 MG TBDP Take 25 mg by mouth at bedtime. Takes every night     glucose blood (ONE TOUCH ULTRA TEST) test strip USE AS INSTRUCTED TO CHECK BLOOD SUGAR ONCE A DAY AS NEEDED. DX CODE: E11.9 100 each 3   Krill Oil 300 MG CAPS Take 300 mg by mouth daily.     Misc Natural Products (OSTEO BI-FLEX JOINT SHIELD PO) Take 1 capsule by mouth daily.      Multiple Vitamin (MULTIVITAMIN) tablet Take 1 tablet by mouth daily.     OneTouch Delica Lancets 99991111 MISC Use as directed to check blood sugar once daily as needed.  Dx code: E11.9 100 each 3   atorvastatin (LIPITOR) 20  MG tablet Take 1 tablet (20 mg total) by mouth daily at 6 PM. 90 tablet 3   Continuous Blood Gluc Sensor (FREESTYLE LIBRE 14 DAY SENSOR) MISC 1 Units by Does not apply route every 14 (fourteen) days. 6 each 3   losartan (COZAAR) 100 MG tablet Take 0.5 tablets (50 mg total) by mouth daily. 45 tablet 3   metFORMIN (GLUCOPHAGE) 500 MG tablet Take 1 tablet (500 mg total) by mouth 2 (two) times daily with a meal. 180 tablet 0   No facility-administered medications prior to visit.     Per HPI unless specifically indicated in ROS section below Review of Systems  Constitutional:  Negative for activity change, appetite change, chills, fatigue, fever and unexpected weight change.  HENT:  Negative for hearing loss.   Eyes:  Negative for visual disturbance.  Respiratory:  Negative for cough, chest tightness, shortness of breath and wheezing.   Cardiovascular:  Negative for chest pain, palpitations and leg swelling.  Gastrointestinal:  Negative for abdominal distention, abdominal pain, blood in stool, constipation, diarrhea, nausea and vomiting.  Genitourinary:  Negative for difficulty urinating and hematuria.  Musculoskeletal:  Negative for arthralgias, myalgias and neck pain.  Skin:  Negative for rash.  Neurological:  Negative for dizziness, seizures, syncope and headaches.  Hematological:  Negative for adenopathy. Does not bruise/bleed easily.  Psychiatric/Behavioral:  Negative for dysphoric mood. The patient is not nervous/anxious.    Objective:  BP 136/78   Pulse 97   Temp 97.7 F (36.5 C) (Temporal)   Ht 6' 1.5" (1.867 m)   Wt (!) 327 lb 2 oz (148.4 kg)   SpO2 97%   BMI 42.57 kg/m   Wt Readings from Last 3 Encounters:  10/08/20 (!) 327 lb 2 oz (148.4 kg)  09/23/20 (!) 330 lb (149.7 kg)  04/07/20 (!) 334 lb 4 oz (151.6 kg)      Physical Exam Vitals and nursing note reviewed.  Constitutional:      General: He is not in acute distress.    Appearance: Normal appearance. He is  well-developed. He is not ill-appearing.  HENT:     Head: Normocephalic and atraumatic.     Right Ear: Hearing, tympanic membrane, ear canal and external ear normal.     Left Ear: Hearing, tympanic membrane, ear canal and external ear normal.  Eyes:     General: No scleral icterus.    Extraocular Movements: Extraocular movements intact.     Conjunctiva/sclera: Conjunctivae normal.     Pupils: Pupils are equal, round, and reactive to light.  Neck:     Thyroid: No thyroid mass or thyromegaly.  Cardiovascular:     Rate and Rhythm: Normal rate and regular rhythm.     Pulses: Normal pulses.  Radial pulses are 2+ on the right side and 2+ on the left side.     Heart sounds: Normal heart sounds. No murmur heard. Pulmonary:     Effort: Pulmonary effort is normal. No respiratory distress.     Breath sounds: Normal breath sounds. No wheezing, rhonchi or rales.  Abdominal:     General: Bowel sounds are normal. There is no distension.     Palpations: Abdomen is soft. There is no mass.     Tenderness: There is no abdominal tenderness. There is no guarding or rebound.     Hernia: No hernia is present.  Musculoskeletal:        General: Normal range of motion.     Cervical back: Normal range of motion and neck supple.     Right lower leg: No edema.     Left lower leg: No edema.  Lymphadenopathy:     Cervical: No cervical adenopathy.  Skin:    General: Skin is warm and dry.     Findings: No rash.  Neurological:     General: No focal deficit present.     Mental Status: He is alert and oriented to person, place, and time.  Psychiatric:        Mood and Affect: Mood normal.        Behavior: Behavior normal.        Thought Content: Thought content normal.        Judgment: Judgment normal.      Results for orders placed or performed in visit on 10/01/20  PSA  Result Value Ref Range   PSA 2.37 0.10 - 4.00 ng/mL  CBC with Differential/Platelet  Result Value Ref Range   WBC 4.5 4.0 -  10.5 K/uL   RBC 5.00 4.22 - 5.81 Mil/uL   Hemoglobin 16.0 13.0 - 17.0 g/dL   HCT 46.8 39.0 - 52.0 %   MCV 93.6 78.0 - 100.0 fl   MCHC 34.2 30.0 - 36.0 g/dL   RDW 12.8 11.5 - 15.5 %   Platelets 208.0 150.0 - 400.0 K/uL   Neutrophils Relative % 55.7 43.0 - 77.0 %   Lymphocytes Relative 32.8 12.0 - 46.0 %   Monocytes Relative 7.5 3.0 - 12.0 %   Eosinophils Relative 3.0 0.0 - 5.0 %   Basophils Relative 1.0 0.0 - 3.0 %   Neutro Abs 2.5 1.4 - 7.7 K/uL   Lymphs Abs 1.5 0.7 - 4.0 K/uL   Monocytes Absolute 0.3 0.1 - 1.0 K/uL   Eosinophils Absolute 0.1 0.0 - 0.7 K/uL   Basophils Absolute 0.0 0.0 - 0.1 K/uL  Hemoglobin A1c  Result Value Ref Range   Hgb A1c MFr Bld 6.4 4.6 - 6.5 %  Comprehensive metabolic panel  Result Value Ref Range   Sodium 139 135 - 145 mEq/L   Potassium 4.4 3.5 - 5.1 mEq/L   Chloride 103 96 - 112 mEq/L   CO2 23 19 - 32 mEq/L   Glucose, Bld 151 (H) 70 - 99 mg/dL   BUN 21 6 - 23 mg/dL   Creatinine, Ser 0.99 0.40 - 1.50 mg/dL   Total Bilirubin 0.7 0.2 - 1.2 mg/dL   Alkaline Phosphatase 58 39 - 117 U/L   AST 16 0 - 37 U/L   ALT 23 0 - 53 U/L   Total Protein 6.9 6.0 - 8.3 g/dL   Albumin 4.3 3.5 - 5.2 g/dL   GFR 76.96 >60.00 mL/min   Calcium 9.4 8.4 - 10.5 mg/dL  Lipid  panel  Result Value Ref Range   Cholesterol 111 0 - 200 mg/dL   Triglycerides 72.0 0.0 - 149.0 mg/dL   HDL 50.50 >39.00 mg/dL   VLDL 14.4 0.0 - 40.0 mg/dL   LDL Cholesterol 46 0 - 99 mg/dL   Total CHOL/HDL Ratio 2    NonHDL 60.01     Assessment & Plan:  This visit occurred during the SARS-CoV-2 public health emergency.  Safety protocols were in place, including screening questions prior to the visit, additional usage of staff PPE, and extensive cleaning of exam room while observing appropriate contact time as indicated for disinfecting solutions.   Problem List Items Addressed This Visit     Obesity, morbid, BMI 40.0-49.9 ([place])    Encouraged ongoing healthy diet and lifestyle choices to affect  sustainable weight loss.       Relevant Medications   metFORMIN (GLUCOPHAGE) 500 MG tablet   Knee osteoarthritis    S/p L knee replacement, considering R side but needs to lose weight.       Personal history of colonic polyps-serrated adenomas and tubular adenoma    Appreciate GI care of patient, currently on yearly colonoscopy schedule      Essential hypertension    Chronic, stable on losartan '50mg'$  daily - continue.       Relevant Medications   atorvastatin (LIPITOR) 20 MG tablet   losartan (COZAAR) 50 MG tablet   Controlled diabetes mellitus type 2 with complications (HCC)    Chronic, well controlled based on cbg's he brings from [facility] (reviewed) and recent A1c. Notes highest sugars in the morning - will try taking all metformin at night ('1000mg'$ ).       Relevant Medications   metFORMIN (GLUCOPHAGE) 500 MG tablet   atorvastatin (LIPITOR) 20 MG tablet   losartan (COZAAR) 50 MG tablet   Coronary artery calcification    Continue aspirin, atorvastatin.       Relevant Medications   atorvastatin (LIPITOR) 20 MG tablet   losartan (COZAAR) 50 MG tablet   Dilated aortic root (HCC)    4cm by echo 2018.  May be overdue for recheck. Due for cardiology f/u 12/2020.       Relevant Medications   atorvastatin (LIPITOR) 20 MG tablet   losartan (COZAAR) 50 MG tablet   Health maintenance examination - Primary    Preventative protocols reviewed and updated unless pt declined. Discussed healthy diet and lifestyle.       Hyperlipidemia associated with type 2 diabetes mellitus ([place]) - goal LDL <70    Chronic, well controlled on atorvastatin The ASCVD Risk score Mikey Bussing DC Jr., et al., 2013) failed to calculate for the following reasons:   The valid total cholesterol range is 130 to 320 mg/dL       Relevant Medications   metFORMIN (GLUCOPHAGE) 500 MG tablet   atorvastatin (LIPITOR) 20 MG tablet   losartan (COZAAR) 50 MG tablet     Meds ordered this encounter  Medications    metFORMIN (GLUCOPHAGE) 500 MG tablet    Sig: Take 2 tablets (1,000 mg total) by mouth at bedtime.    Dispense:  180 tablet    Refill:  3   atorvastatin (LIPITOR) 20 MG tablet    Sig: Take 1 tablet (20 mg total) by mouth daily at 6 PM.    Dispense:  90 tablet    Refill:  3   losartan (COZAAR) 50 MG tablet    Sig: Take 1 tablet (50 mg total) by mouth daily.  Dispense:  90 tablet    Refill:  3   Continuous Blood Gluc Sensor (FREESTYLE LIBRE 14 DAY SENSOR) MISC    Sig: 1 Units by Does not apply route every 14 (fourteen) days.    Dispense:  6 each    Refill:  3    No orders of the defined types were placed in this encounter.   Patient instructions: You are doing well today  Change metformin to night time dosing.  Return as needed or in 6 months for diabetes follow up visit.   Follow up plan: Return in about 6 months (around 04/10/2021) for follow up visit.  Ria Bush, MD

## 2020-10-27 ENCOUNTER — Other Ambulatory Visit: Payer: Self-pay | Admitting: Family Medicine

## 2020-11-02 ENCOUNTER — Encounter: Payer: Self-pay | Admitting: Family Medicine

## 2020-11-25 ENCOUNTER — Telehealth: Payer: Self-pay | Admitting: Internal Medicine

## 2020-11-25 NOTE — Telephone Encounter (Signed)
Please call patient to schedule colon in the hospital. He said to leave message on home phone number if you don't reach him directly.

## 2020-11-25 NOTE — Telephone Encounter (Signed)
I spoke with his wife Delois to let her know we got the message and will try to work on this tomorrow. She is requesting a date after Thanksgiving. I told her that may be in December.

## 2020-11-26 ENCOUNTER — Other Ambulatory Visit: Payer: Self-pay | Admitting: Internal Medicine

## 2020-11-26 DIAGNOSIS — Z86010 Personal history of colonic polyps: Secondary | ICD-10-CM

## 2020-11-26 NOTE — Telephone Encounter (Signed)
I have spoken to [facility] and informed Dennis Ayers he is set up for a colonoscopy at [facility] on 01/26/21 at 8:30am. His pre-visit appointment is 01/20/21 at 4:30pm. Paperwork mailed to [place], I confirmed his address.

## 2020-11-27 ENCOUNTER — Encounter: Payer: Self-pay | Admitting: Internal Medicine

## 2020-11-28 ENCOUNTER — Other Ambulatory Visit: Payer: Self-pay | Admitting: Family Medicine

## 2020-11-28 ENCOUNTER — Encounter: Payer: Self-pay | Admitting: Family Medicine

## 2020-11-28 NOTE — Telephone Encounter (Signed)
Updated pt's chart.  

## 2020-12-18 ENCOUNTER — Other Ambulatory Visit: Payer: Self-pay

## 2020-12-18 ENCOUNTER — Encounter: Payer: Self-pay | Admitting: Internal Medicine

## 2020-12-18 ENCOUNTER — Ambulatory Visit: Payer: Medicare Other | Admitting: Internal Medicine

## 2020-12-18 VITALS — BP 150/78 | HR 99 | Ht 74.0 in | Wt 323.0 lb

## 2020-12-18 DIAGNOSIS — I2584 Coronary atherosclerosis due to calcified coronary lesion: Secondary | ICD-10-CM

## 2020-12-18 DIAGNOSIS — E785 Hyperlipidemia, unspecified: Secondary | ICD-10-CM | POA: Diagnosis not present

## 2020-12-18 DIAGNOSIS — I10 Essential (primary) hypertension: Secondary | ICD-10-CM

## 2020-12-18 DIAGNOSIS — Z79899 Other long term (current) drug therapy: Secondary | ICD-10-CM | POA: Diagnosis not present

## 2020-12-18 DIAGNOSIS — I2510 Atherosclerotic heart disease of native coronary artery without angina pectoris: Secondary | ICD-10-CM | POA: Diagnosis not present

## 2020-12-18 DIAGNOSIS — E1169 Type 2 diabetes mellitus with other specified complication: Secondary | ICD-10-CM | POA: Diagnosis not present

## 2020-12-18 MED ORDER — LOSARTAN POTASSIUM 100 MG PO TABS: 100.0000 mg | ORAL_TABLET | Freq: Every day | ORAL | 3 refills | Status: DC

## 2020-12-18 NOTE — Progress Notes (Signed)
Follow-up Outpatient Visit Date: 12/18/2020  Primary Care Provider: Ria Bush, MD [place] [address]  Chief Complaint: Follow-up coronary artery calcification  HPI:  [name] is a 70 y.o. male with history of coronary artery calcification, type 2 diabetes mellitus, hypertension, morbid obesity, obstructive sleep apnea, and degenerative joint disease, who presents for follow-up of coronary artery calcification.  He was last seen in our office a year ago by Lorenso Quarry, PA, at which time he was doing well.  Atorvastatin was decreased to 20 mg daily due to excellent LDL control.  No further testing was pursued.  Today, [name] reports that he has been feeling fairly well.  He denies chest pain, shortness of breath, palpitations, lightheadedness, and edema.  He is remaining active in his yard but does not exercise on a regular basis.  He does not wish to stray too far from home because his wife is dealing with several health issues.  He does not check his blood pressure regularly at home though when he does, his blood pressures are usually in the 130's/70's.  --------------------------------------------------------------------------------------------------  Cardiovascular History & Procedures: Cardiovascular Problems: Coronary artery calcification Dilated thoracic aorta by echo; upper normal on cardiac CT   Risk Factors: Hypertension, diabetes mellitus, obesity, sedentary lifestyle, male gender, age greater than 41, and family history   Cath/PCI: None   CV Surgery: None   EP Procedures and Devices: None   Non-Invasive Evaluation(s): Exercise tolerance test (08/23/2016): Poor exercise capacity.  No ischemia at workload achieved (4.8 METS).  Intermediate risk study ([facility] treadmill score 3) due to poor exercise capacity. TTE (06/29/16): Normal LV size with mild LVH. LVEF 55-60% with normal wall motion. Normal diastolic function. Mildly dilated  aortic root, measuring 4.0 cm. Mild left atrial enlargement. Normal RV size and function. No significant valvular abnormalities. Coronary calcium score (06/09/16): Calcification of all 3 coronary arteries noted within a score of 102 Agatston units (53rd percentile for age and sex matched cohort). Ascending aorta measuring 3.6 cm in diameter. No significant extracardiac abnormalities in the visualized thorax.  Recent CV Pertinent Labs: Lab Results  Component Value Date   CHOL 111 10/01/2020   HDL 50.50 10/01/2020   LDLCALC 46 10/01/2020   TRIG 72.0 10/01/2020   CHOLHDL 2 10/01/2020   INR 1.04 07/19/2012   K 4.4 10/01/2020   BUN 21 10/01/2020   BUN 24 05/19/2016   CREATININE 0.99 10/01/2020    Past medical and surgical history were reviewed and updated in EPIC.  Current Meds  Medication Sig   acetaminophen (TYLENOL) 500 MG tablet Take 1,000 mg by mouth every 6 (six) hours as needed.   aspirin EC 81 MG tablet Take 81 mg by mouth daily.   atorvastatin (LIPITOR) 20 MG tablet Take 1 tablet (20 mg total) by mouth daily at 6 PM.   Continuous Blood Gluc Sensor (FREESTYLE LIBRE 14 DAY SENSOR) MISC 1 Units by Does not apply route every 14 (fourteen) days.   diphenhydrAMINE HCl, Sleep, 25 MG TBDP Take 25 mg by mouth at bedtime. Takes every night   glucose blood (ONE TOUCH ULTRA TEST) test strip USE AS INSTRUCTED TO CHECK BLOOD SUGAR ONCE A DAY AS NEEDED. DX CODE: E11.9   Krill Oil 300 MG CAPS Take 300 mg by mouth daily.   losartan (COZAAR) 50 MG tablet Take 1 tablet (50 mg total) by mouth daily.   metFORMIN (GLUCOPHAGE) 500 MG tablet Take 500 mg by mouth 2 (two) times daily  with a meal.   Misc Natural Products (OSTEO BI-FLEX JOINT SHIELD PO) Take 1 capsule by mouth daily.    Multiple Vitamin (MULTIVITAMIN) tablet Take 1 tablet by mouth daily.   OneTouch Delica Lancets 40J MISC Use as directed to check blood sugar once daily as needed.  Dx code: E11.9    Allergies: Lisinopril  Social History    Tobacco Use   Smoking status: Never   Smokeless tobacco: Never  Vaping Use   Vaping Use: Never used  Substance Use Topics   Alcohol use: No   Drug use: No    Family History  Problem Relation Age of Onset   Ovarian cancer Mother    Ulcers Mother        stomach   Coronary artery disease Father 50       CABG X4   Hypertension Father    Stroke Father 75   Heart attack Father 30   Heart attack Sister 17   Heart Problems Paternal Aunt    Heart attack Paternal Aunt    Heart attack Paternal Uncle    Heart attack Paternal Uncle    Diabetes Neg Hx    Prostate cancer Neg Hx    Breast cancer Neg Hx    Colon cancer Neg Hx    Depression Neg Hx    Alcohol abuse Neg Hx    Colon polyps Neg Hx    Stomach cancer Neg Hx    Rectal cancer Neg Hx     Review of Systems: A 12-system review of systems was performed and was negative except as noted in the HPI.  --------------------------------------------------------------------------------------------------  Physical Exam: BP (!) 150/78 (BP Location: Left Arm, Patient Position: Sitting, Cuff Size: Large)   Pulse 99   Ht 6\' 2"  (1.88 m)   Wt (!) 323 lb (146.5 kg)   SpO2 96%   BMI 41.47 kg/m   General:  NAD. Neck: No JVD or HJR. Lungs: Clear to auscultation bilaterally without wheezes or crackles. Heart: Regular rate and rhythm without murmurs, rubs, or gallops. Abdomen: Soft, nontender, nondistended. Extremities: No lower extremity edema.  EKG: Normal sinus rhythm with left axis deviation.  No significant change since 12/19/2019.  Lab Results  Component Value Date   WBC 4.5 10/01/2020   HGB 16.0 10/01/2020   HCT 46.8 10/01/2020   MCV 93.6 10/01/2020   PLT 208.0 10/01/2020    Lab Results  Component Value Date   NA 139 10/01/2020   K 4.4 10/01/2020   CL 103 10/01/2020   CO2 23 10/01/2020   BUN 21 10/01/2020   CREATININE 0.99 10/01/2020   GLUCOSE 151 (H) 10/01/2020   ALT 23 10/01/2020    Lab Results  Component  Value Date   CHOL 111 10/01/2020   HDL 50.50 10/01/2020   LDLCALC 46 10/01/2020   TRIG 72.0 10/01/2020   CHOLHDL 2 10/01/2020    --------------------------------------------------------------------------------------------------  ASSESSMENT AND PLAN: Coronary artery calcification: No angina reported.  Continue current medications for secondary prevention.  Hypertension: Blood pressure suboptimally controlled today as well as at prior checks with Dr. [name] (target blood pressure less than 130/80 with history of diabetes mellitus).  We have discussed importance of continued sodium restriction.  Will increase losartan to 100 mg daily with follow-up BMP in 2 weeks.  Mr. Grayer is scheduled for follow-up with Dr. [name] in February, at which time blood pressure response can be reassessed.  Hyperlipidemia associated with type 2 diabetes mellitus: Lipids well controlled on last check in  August.  Continue atorvastatin 20 mg daily.  Morbid obesity: BMI remains greater than 40 with multiple comorbidities (CAD, HTN, OSA, and DM).  Weight loss encouraged through diet and exercise.  Follow-up: Return to clinic in 1 year.  Nelva Bush, MD 12/18/2020 9:21 AM

## 2020-12-18 NOTE — Patient Instructions (Signed)
Medication Instructions:   Your physician has recommended you make the following change in your medication:   INCREASE Losartan 100 mg daily   *If you need a refill on your cardiac medications before your next appointment, please call your pharmacy*   Lab Work:  Your physician recommends that you return for lab work in: [place] (BMET)   - This lab does not require fasting  Testing/Procedures:  None ordered   Follow-Up: At [facility], you and your health needs are our priority.  As part of our continuing mission to provide you with exceptional heart care, we have created designated Provider Care Teams.  These Care Teams include your primary Cardiologist (physician) and Advanced Practice Providers (APPs -  Physician Assistants and Nurse Practitioners) who all work together to provide you with the care you need, when you need it.  We recommend signing up for the patient portal called "MyChart".  Sign up information is provided on this After Visit Summary.  MyChart is used to connect with patients for Virtual Visits (Telemedicine).  Patients are able to view lab/test results, encounter notes, upcoming appointments, etc.  Non-urgent messages can be sent to your provider as well.   To learn more about what you can do with MyChart, go to [email or web address].    Your next appointment:   1 year(s)  The format for your next appointment:   In Person  Provider:   You may see Nelva Bush, MD or one of the following Advanced Practice Providers on your designated Care Team:   Murray Hodgkins, NP Christell Faith, PA-C Marrianne Mood, PA-C Cadence [place], [place]

## 2020-12-26 ENCOUNTER — Other Ambulatory Visit: Payer: Self-pay | Admitting: Family Medicine

## 2020-12-26 DIAGNOSIS — E118 Type 2 diabetes mellitus with unspecified complications: Secondary | ICD-10-CM

## 2020-12-31 ENCOUNTER — Other Ambulatory Visit (INDEPENDENT_AMBULATORY_CARE_PROVIDER_SITE_OTHER): Payer: Medicare Other

## 2020-12-31 ENCOUNTER — Other Ambulatory Visit: Payer: Self-pay

## 2020-12-31 DIAGNOSIS — I2510 Atherosclerotic heart disease of native coronary artery without angina pectoris: Secondary | ICD-10-CM | POA: Diagnosis not present

## 2020-12-31 DIAGNOSIS — Z79899 Other long term (current) drug therapy: Secondary | ICD-10-CM

## 2020-12-31 DIAGNOSIS — I10 Essential (primary) hypertension: Secondary | ICD-10-CM

## 2020-12-31 DIAGNOSIS — I2584 Coronary atherosclerosis due to calcified coronary lesion: Secondary | ICD-10-CM | POA: Diagnosis not present

## 2021-01-01 LAB — BASIC METABOLIC PANEL
BUN/Creatinine Ratio: 21 (ref 10–24)
BUN: 22 mg/dL (ref 8–27)
CO2: 22 mmol/L (ref 20–29)
Calcium: 9.1 mg/dL (ref 8.6–10.2)
Chloride: 102 mmol/L (ref 96–106)
Creatinine, Ser: 1.04 mg/dL (ref 0.76–1.27)
Glucose: 163 mg/dL — ABNORMAL HIGH (ref 70–99)
Potassium: 4.8 mmol/L (ref 3.5–5.2)
Sodium: 140 mmol/L (ref 134–144)
eGFR: 77 mL/min/{1.73_m2} (ref >59)

## 2021-01-09 ENCOUNTER — Other Ambulatory Visit: Payer: Self-pay

## 2021-01-09 ENCOUNTER — Encounter (HOSPITAL_COMMUNITY): Payer: Self-pay | Admitting: Internal Medicine

## 2021-01-20 ENCOUNTER — Other Ambulatory Visit: Payer: Self-pay

## 2021-01-20 ENCOUNTER — Ambulatory Visit (AMBULATORY_SURGERY_CENTER): Payer: Self-pay

## 2021-01-20 VITALS — Ht 74.0 in | Wt 333.0 lb

## 2021-01-20 DIAGNOSIS — Z86010 Personal history of colonic polyps: Secondary | ICD-10-CM

## 2021-01-20 MED ORDER — PLENVU 140 G PO SOLR
1.0000 | ORAL | 0 refills | Status: DC
Start: 2021-01-20 — End: 2021-01-26

## 2021-01-20 NOTE — Progress Notes (Signed)

## 2021-01-26 ENCOUNTER — Other Ambulatory Visit: Payer: Self-pay

## 2021-01-26 ENCOUNTER — Ambulatory Visit (HOSPITAL_COMMUNITY)
Admission: RE | Admit: 2021-01-26 | Discharge: 2021-01-26 | Disposition: A | Discharge status: Home / Self Care | Payer: Medicare Other | Attending: Internal Medicine | Admitting: Internal Medicine

## 2021-01-26 ENCOUNTER — Encounter (HOSPITAL_COMMUNITY)
Admission: RE | Disposition: A | Discharge status: Home / Self Care | Payer: Self-pay | Source: Home / Self Care | Attending: Internal Medicine

## 2021-01-26 ENCOUNTER — Ambulatory Visit (HOSPITAL_COMMUNITY): Payer: Medicare Other | Admitting: Certified Registered Nurse Anesthetist

## 2021-01-26 ENCOUNTER — Encounter (HOSPITAL_COMMUNITY): Payer: Self-pay | Admitting: Internal Medicine

## 2021-01-26 DIAGNOSIS — E119 Type 2 diabetes mellitus without complications: Secondary | ICD-10-CM | POA: Diagnosis not present

## 2021-01-26 DIAGNOSIS — Z79899 Other long term (current) drug therapy: Secondary | ICD-10-CM | POA: Insufficient documentation

## 2021-01-26 DIAGNOSIS — I10 Essential (primary) hypertension: Secondary | ICD-10-CM | POA: Diagnosis not present

## 2021-01-26 DIAGNOSIS — K648 Other hemorrhoids: Secondary | ICD-10-CM | POA: Diagnosis not present

## 2021-01-26 DIAGNOSIS — D126 Benign neoplasm of colon, unspecified: Secondary | ICD-10-CM | POA: Diagnosis present

## 2021-01-26 DIAGNOSIS — K5730 Diverticulosis of large intestine without perforation or abscess without bleeding: Secondary | ICD-10-CM | POA: Diagnosis not present

## 2021-01-26 DIAGNOSIS — D1391 Familial adenomatous polyposis: Secondary | ICD-10-CM | POA: Diagnosis present

## 2021-01-26 DIAGNOSIS — Z5309 Procedure and treatment not carried out because of other contraindication: Secondary | ICD-10-CM | POA: Diagnosis not present

## 2021-01-26 DIAGNOSIS — Z7984 Long term (current) use of oral hypoglycemic drugs: Secondary | ICD-10-CM | POA: Diagnosis not present

## 2021-01-26 DIAGNOSIS — Q438 Other specified congenital malformations of intestine: Secondary | ICD-10-CM | POA: Insufficient documentation

## 2021-01-26 DIAGNOSIS — G4733 Obstructive sleep apnea (adult) (pediatric): Secondary | ICD-10-CM | POA: Diagnosis not present

## 2021-01-26 DIAGNOSIS — G4730 Sleep apnea, unspecified: Secondary | ICD-10-CM | POA: Insufficient documentation

## 2021-01-26 DIAGNOSIS — Z86010 Personal history of colonic polyps: Secondary | ICD-10-CM | POA: Insufficient documentation

## 2021-01-26 DIAGNOSIS — I2510 Atherosclerotic heart disease of native coronary artery without angina pectoris: Secondary | ICD-10-CM | POA: Diagnosis not present

## 2021-01-26 HISTORY — PX: COLONOSCOPY WITH PROPOFOL: SHX5780

## 2021-01-26 LAB — GLUCOSE, CAPILLARY: Glucose-Capillary: 127 mg/dL — ABNORMAL HIGH (ref 70–99)

## 2021-01-26 SURGERY — COLONOSCOPY WITH PROPOFOL
Anesthesia: Monitor Anesthesia Care

## 2021-01-26 MED ORDER — EPHEDRINE SULFATE-NACL 50-0.9 MG/10ML-% IV SOSY
PREFILLED_SYRINGE | INTRAVENOUS | Status: DC | PRN
  Administered 2021-01-26: 10 mg via INTRAVENOUS

## 2021-01-26 MED ORDER — SUCCINYLCHOLINE CHLORIDE 200 MG/10ML IV SOSY
PREFILLED_SYRINGE | INTRAVENOUS | Status: DC | PRN
  Administered 2021-01-26: 140 mg via INTRAVENOUS

## 2021-01-26 MED ORDER — PROPOFOL 500 MG/50ML IV EMUL
INTRAVENOUS | Status: DC | PRN
Start: 2021-01-26 — End: 2021-01-26
  Administered 2021-01-26: 100 ug/kg/min via INTRAVENOUS

## 2021-01-26 MED ORDER — LACTATED RINGERS IV SOLN: INTRAVENOUS | Status: DC

## 2021-01-26 MED ORDER — SODIUM CHLORIDE 0.9 % IV SOLN: INTRAVENOUS | Status: DC

## 2021-01-26 MED ORDER — PROPOFOL 500 MG/50ML IV EMUL
INTRAVENOUS | Status: AC
  Filled 2021-01-26: qty 50

## 2021-01-26 MED ORDER — PROPOFOL 10 MG/ML IV BOLUS
INTRAVENOUS | Status: DC | PRN
  Administered 2021-01-26 (×3): 20 mg via INTRAVENOUS
  Administered 2021-01-26: 60 mg via INTRAVENOUS

## 2021-01-26 MED ORDER — DEXAMETHASONE SODIUM PHOSPHATE 10 MG/ML IJ SOLN
INTRAMUSCULAR | Status: DC | PRN
  Administered 2021-01-26: 10 mg via INTRAVENOUS

## 2021-01-26 MED ORDER — ONDANSETRON HCL 4 MG/2ML IJ SOLN
INTRAMUSCULAR | Status: DC | PRN
  Administered 2021-01-26: 4 mg via INTRAVENOUS

## 2021-01-26 SURGICAL SUPPLY — 22 items

## 2021-01-26 NOTE — Anesthesia Procedure Notes (Signed)
Procedure Name: Intubation Date/Time: 01/26/2021 9:18 AM Performed by: Eben Burow, CRNA Pre-anesthesia Checklist: Patient identified, Emergency Drugs available, Suction available, Patient being monitored and Timeout performed Patient Re-evaluated:Patient Re-evaluated prior to induction Oxygen Delivery Method: Circle system utilized Preoxygenation: Pre-oxygenation with 100% oxygen Induction Type: IV induction, Rapid sequence and Cricoid Pressure applied Laryngoscope Size: Glidescope and 4 Tube type: Oral Tube size: 7.5 mm Number of attempts: 1 Airway Equipment and Method: Stylet Placement Confirmation: ETT inserted through vocal cords under direct vision, positive ETCO2 and breath sounds checked- equal and bilateral Secured at: 22 cm Tube secured with: Tape Dental Injury: Teeth and Oropharynx as per pre-operative assessment  Comments: Dennis Ayers view of vocal cords with Glidescope

## 2021-01-26 NOTE — Anesthesia Postprocedure Evaluation (Addendum)
Anesthesia Post Note  Patient: Dennis Ayers  Procedure(s) Performed: COLONOSCOPY WITH PROPOFOL     Patient location during evaluation: PACU Anesthesia Type: General Level of consciousness: awake Pain management: pain level controlled Vital Signs Assessment: post-procedure vital signs reviewed and stable Respiratory status: spontaneous breathing, nonlabored ventilation, respiratory function stable and patient connected to nasal cannula oxygen Cardiovascular status: stable and blood pressure returned to baseline Postop Assessment: no apparent nausea or vomiting Anesthetic complications: no   No notable events documented.  Last Vitals:  Vitals:   01/26/21 1020 01/26/21 1030  BP: (!) 144/64 (!) 155/69  Pulse: 92 93  Resp: 17 15  Temp:    SpO2: 95% 94%    Last Pain:  Vitals:   01/26/21 1030  TempSrc:   PainSc: 0-No pain                 Effie Berkshire

## 2021-01-26 NOTE — Op Note (Signed)
[facility] Patient Name: Dennis Ayers Procedure Date: 01/26/2021 MRN: [id number] Attending MD: Gatha Mayer , MD Date of Birth: [birth year] CSN: [id number] Age: 70 Admit Type: Outpatient Procedure:                Colonoscopy Indications:              High risk colon cancer surveillance: Personal                            history of familial adenomatous polyposis, Last                            colonoscopy: November 2021 Providers:                Gatha Mayer, MD, Carmie End, RN, Cherylynn Ridges, Technician, Christell Faith, CRNA Referring MD:              Medicines:                Propofol per Anesthesia, General Anesthesia Complications:            No immediate complications. Estimated Blood Loss:     Estimated blood loss: none. Procedure:                Pre-Anesthesia Assessment:                           - Prior to the procedure, a History and Physical                            was performed, and patient medications and                            allergies were reviewed. The patient's tolerance of                            previous anesthesia was also reviewed. The risks                            and benefits of the procedure and the sedation                            options and risks were discussed with the patient.                            All questions were answered, and informed consent                            was obtained. Prior Anticoagulants: The patient has                            taken no previous anticoagulant or antiplatelet  agents. ASA Grade Assessment: III - A patient with                            severe systemic disease. After reviewing the risks                            and benefits, the patient was deemed in                            satisfactory condition to undergo the procedure.                           After obtaining informed consent, the colonoscope                             was passed under direct vision. Throughout the                            procedure, the patient's blood pressure, pulse, and                            oxygen saturations were monitored continuously. The                            CF-HQ190L ([id number]) Olympus colonoscope was                            introduced through the anus with the intention of                            advancing to the cecum. The scope was advanced to                            the transverse colon before the procedure was                            aborted. Medications were given. The colonoscopy                            was performed with difficulty due to a redundant                            colon and significant looping. The patient                            tolerated the procedure well. The quality of the                            bowel preparation was excellent. The bowel                            preparation used was Plenvu via split dose  instruction. The rectum was photographed. Scope In: 8:43:01 AM Scope Out: 9:43:26 AM Total Procedure Duration: 1 hour 0 minutes 25 seconds  Findings:      The perianal and digital rectal examinations were normal. Pertinent       negatives include normal prostate (size, shape, and consistency).      Multiple diverticula were found in the sigmoid colon.      Internal hemorrhoids were found.      The descending colon, splenic flexure and transverse colon appeared       normal.      The transverse colon revealed excessive looping. Impression:               - Diverticulosis in the sigmoid colon.                           - Internal hemorrhoids.                           - The descending colon, splenic flexure and                            transverse colon are normal.                           - There was significant looping of the colon -                            INCOMPLETE EXAM - THINK TRANSVERSE COLON MAXIMAL                             EXTENT - USED ABDOMINAL BINDER, WATER INTUBATION W/                            MININAL INSUFFLATION - DESPITE THIS AND ABDOMINAL                            PRESSURE COULD NOT ADVANCE AND RAN OUT OF SCOPE.                            INTUBATED PATIENT AND PLACED PROBNE AND IN THE END                            HE WAS IN ALL 4 POSITIONS W/O SUCCESS                           - No specimens collected.                           - Personal history of colonic polyps. > 30                            PRECANCEROUS POLYPS SO C/W A POLYPOSIS SYNDROME Moderate Sedation:      Not Applicable - Patient had care per Anesthesia. Recommendation:           - Patient has a contact number available for  emergencies. The signs and symptoms of potential                            delayed complications were discussed with the                            patient. Return to normal activities tomorrow.                            Written discharge instructions were provided to the                            patient.                           - Resume previous diet.                           - Continue present medications.                           - Repeat colonoscopy will be recommended - think                            refer to [facility].                           - Will call patient and discuss repeat colonoscopy                            options Procedure Code(s):        --- Professional ---                           G0105, 53, Colorectal cancer screening; colonoscopy                            on individual at high risk Diagnosis Code(s):        --- Professional ---                           Z86.010, Personal history of colonic polyps                           K64.8, Other hemorrhoids                           K57.30, Diverticulosis of large intestine without                            perforation or abscess without bleeding CPT copyright 2019 American Medical Association. All  rights reserved. The codes documented in this report are preliminary and upon coder review may  be revised to meet current compliance requirements. Gatha Mayer, MD 01/26/2021 10:07:58 AM This report has been signed electronically. Number of Addenda: 0

## 2021-01-26 NOTE — Anesthesia Preprocedure Evaluation (Addendum)
Anesthesia Evaluation  Patient identified by MRN, date of birth, ID band Patient awake    Reviewed: Allergy & Precautions, NPO status , Patient's Chart, lab work & pertinent test results  Airway Mallampati: III  TM Distance: >3 FB Neck ROM: Full    Dental  (+) Teeth Intact, Dental Advisory Given   Pulmonary sleep apnea ,    breath sounds clear to auscultation       Cardiovascular hypertension, Pt. on medications + CAD   Rhythm:Regular Rate:Normal     Neuro/Psych negative neurological ROS  negative psych ROS   GI/Hepatic negative GI ROS, (+) Hepatitis -  Endo/Other  diabetes, Type 2, Oral Hypoglycemic Agents  Renal/GU      Musculoskeletal  (+) Arthritis ,   Abdominal (+) + obese,   Peds  Hematology negative hematology ROS (+)   Anesthesia Other Findings   Reproductive/Obstetrics                            Anesthesia Physical Anesthesia Plan  ASA: 3  Anesthesia Plan: MAC   Post-op Pain Management:    Induction: Intravenous  PONV Risk Score and Plan: 0 and Propofol infusion  Airway Management Planned: Natural Airway and Simple Face Mask  Additional Equipment: None  Intra-op Plan:   Post-operative Plan:   Informed Consent: I have reviewed the patients History and Physical, chart, labs and discussed the procedure including the risks, benefits and alternatives for the proposed anesthesia with the patient or authorized representative who has indicated his/her understanding and acceptance.       Plan Discussed with: CRNA  Anesthesia Plan Comments:         Anesthesia Quick Evaluation

## 2021-01-26 NOTE — Anesthesia Procedure Notes (Addendum)
Procedure Name: MAC Date/Time: 01/26/2021 8:36 AM Performed by: West Pugh, CRNA Pre-anesthesia Checklist: Patient identified, Emergency Drugs available, Suction available, Patient being monitored and Timeout performed Patient Re-evaluated:Patient Re-evaluated prior to induction Oxygen Delivery Method: Simple face mask Preoxygenation: Pre-oxygenation with 100% oxygen Induction Type: IV induction Placement Confirmation: positive ETCO2 Dental Injury: Teeth and Oropharynx as per pre-operative assessment

## 2021-01-26 NOTE — Transfer of Care (Signed)
Immediate Anesthesia Transfer of Care Note  Patient: Dennis Ayers  Procedure(s) Performed: COLONOSCOPY WITH PROPOFOL  Patient Location: PACU and Endoscopy Unit  Anesthesia Type:General  Level of Consciousness: awake, drowsy and patient cooperative  Airway & Oxygen Therapy: Patient Spontanous Breathing and Patient connected to face mask oxygen  Post-op Assessment: Report given to RN and Post -op Vital signs reviewed and stable  Post vital signs: Reviewed and stable  Last Vitals:  Vitals Value Taken Time  BP 117/59 01/26/21 1000  Temp    Pulse 95 01/26/21 1001  Resp 16 01/26/21 1001  SpO2 94 % 01/26/21 1001  Vitals shown include unvalidated device data.  Last Pain:  Vitals:   01/26/21 0712  TempSrc: Oral  PainSc: 0-No pain         Complications: No notable events documented.

## 2021-01-26 NOTE — H&P (Signed)
[facility] History and Physical   Primary Care Physician:  Ria Bush, MD   Reason for Procedure:   polyposis  Plan:    colonoscopy     HPI: Dennis Ayers is a 70 y.o. male w/ suspected attenuated polyposis coli here for surveillance colonoscopy   Past Medical History:  Diagnosis Date   Benign neoplasm of cecum - 25 mm polyp 08/06/2015   Coronary artery calcification    Diabetes mellitus without complication ([place])    DJD (degenerative joint disease) of knee    [place] steroid injection (01/2014)   Family history of ovarian cancer    Hyperglycemia    Hyperlipidemia    Hypertension    Jaundice    with icterus, when very young, treated and resolved   Knee pain    Morbid obesity ([place])    OSA (obstructive sleep apnea)    mild to mod, working on weight loss, no cpap needed per test   Osteoarthritis of knee 07/23/2011   blat s/p L TKR ([place])   Redundant colon 11/16/2017   Severe obesity (BMI >= 40) ([place]) 02/15/2008   Sleep apnea    does not have a cpap   Viral hepatitis 8th grade   has had jaundice in past, unsure of cause ?Hep A    Past Surgical History:  Procedure Laterality Date   CHOLECYSTECTOMY  1998   COLONOSCOPY  08/20/2011   4 adenomatous polyps - rpt 3 yrs Gatha Mayer)   COLONOSCOPY  07/22/2015   mult TAs, severe diverticulosis, one large 2.5cm cecal polyp pending resection Carlean Purl)   COLONOSCOPY N/A 09/10/2015   large TA, rpt 1 yr Gatha Mayer, MD)   COLONOSCOPY  11/2017   7 TAs, 3 SSPs, diverticulosis, rpt 2 yrs Carlean Purl)   COLONOSCOPY  12/2019   multiple TA and SSP, diverticulosis with excess colon looping, rpt 1 year ([place])   COLONOSCOPY WITH PROPOFOL N/A 10/29/2016   TA, rpt 1 yr Carlean Purl, Ofilia Neas, MD)   EYE SURGERY Right    laser   HOT HEMOSTASIS N/A 10/29/2016   Procedure: HOT HEMOSTASIS (ARGON PLASMA COAGULATION/BICAP);  Surgeon: Gatha Mayer, MD;  Location: [facility];  Service: Endoscopy;  Laterality:  N/A;   TONSILLECTOMY     TOTAL KNEE ARTHROPLASTY Left 07/25/2012   Surgeon: Johnny Bridge, MD   Varus Gonarthrosis  04/02/2002   Dr. Alphonzo Severance    Prior to Admission medications   Medication Sig Start Date End Date Taking? Authorizing Provider  acetaminophen (TYLENOL) 500 MG tablet Take 1,000 mg by mouth every 6 (six) hours as needed for moderate pain or mild pain.   Yes [provider]  aspirin EC 81 MG tablet Take 81 mg by mouth daily.   Yes [provider]  atorvastatin (LIPITOR) 20 MG tablet Take 1 tablet (20 mg total) by mouth daily at 6 PM. 10/08/20  Yes Ria Bush, MD  Continuous Blood Gluc Sensor (FREESTYLE LIBRE 14 DAY SENSOR) MISC 1 Units by Does not apply route every 14 (fourteen) days. 10/08/20  Yes Ria Bush, MD  doxylamine, Sleep, (UNISOM) 25 MG tablet Take 50 mg by mouth at bedtime.   Yes [provider]  glucose blood (ONETOUCH ULTRA) test strip Check blood sugar once a day 12/26/20  Yes Ria Bush, MD  Krill Oil 300 MG CAPS Take 300 mg by mouth daily.   Yes [provider]  losartan (COZAAR) 100 MG tablet Take 1 tablet (100 mg total) by mouth daily.  12/18/20 12/13/21 Yes End, Harrell Gave, MD  metFORMIN (GLUCOPHAGE) 500 MG tablet Take 500 mg by mouth 2 (two) times daily with a meal.   Yes [provider]  Misc Natural Products (OSTEO BI-FLEX JOINT SHIELD PO) Take 1 capsule by mouth daily.    Yes [provider]  Multiple Vitamin (MULTIVITAMIN) tablet Take 1 tablet by mouth daily.   Yes [provider]  OneTouch Delica Lancets 91P MISC Use as directed to check blood sugar once daily as needed.  Dx code: E11.9 12/31/19  Yes Ria Bush, MD  PEG-KCl-NaCl-NaSulf-Na Asc-C (PLENVU) 140 g SOLR Take 1 kit by mouth as directed. 01/20/21  Yes Gatha Mayer, MD    Current Facility-Administered Medications  Medication Dose Route Frequency Provider Last Rate Last Admin   0.9 %  sodium chloride  infusion   Intravenous Continuous Gatha Mayer, MD       lactated ringers infusion   Intravenous Continuous Gatha Mayer, MD   New Bag at 01/26/21 0815    Allergies as of 11/26/2020 - Review Complete 10/08/2020  Allergen Reaction Noted   Lisinopril Other (See Comments) 09/10/2016    Family History  Problem Relation Age of Onset   Ovarian cancer Mother    Ulcers Mother        stomach   Coronary artery disease Father 67       CABG X4   Hypertension Father    Stroke Father 44   Heart attack Father 54   Heart attack Sister 97   Heart Problems Paternal Aunt    Heart attack Paternal Aunt    Heart attack Paternal Uncle    Heart attack Paternal Uncle    Diabetes Neg Hx    Prostate cancer Neg Hx    Breast cancer Neg Hx    Colon cancer Neg Hx    Depression Neg Hx    Alcohol abuse Neg Hx    Colon polyps Neg Hx    Stomach cancer Neg Hx    Rectal cancer Neg Hx    Esophageal cancer Neg Hx     Social History   Socioeconomic History   Marital status: Married    Spouse name: Not on file   Number of children: 1   Years of education: Not on file   Highest education level: Not on file  Occupational History   Occupation: Self-employed  Tobacco Use   Smoking status: Never   Smokeless tobacco: Never  Vaping Use   Vaping Use: Never used  Substance and Sexual Activity   Alcohol use: No   Drug use: No   Sexual activity: Not Currently  Other Topics Concern   Not on file  Social History Narrative   Caffeine: 2-3 diet sodas/day   Lives with wife   1 grown son.   Occupation: Self-employed; Psychologist, forensic   Activity: some golf, limited by knee pain   Diet: good water, daily vegetables   Social Determinants of Health   Financial Resource Strain: Low Risk    Difficulty of Paying Living Expenses: Not hard at all  Food Insecurity: No Food Insecurity   Worried About Charity fundraiser in the Last Year: Never true   Arboriculturist in the Last Year:  Never true  Transportation Needs: No Transportation Needs   Lack of Transportation (Medical): No   Lack of Transportation (Non-Medical): No  Physical Activity: Inactive   Days of Exercise per Week: 0 days   Minutes of  Exercise per Session: 0 min  Stress: No Stress Concern Present   Feeling of Stress : Not at all  Social Connections: Not on file  Intimate Partner Violence: Not At Risk   Fear of Current or Ex-Partner: No   Emotionally Abused: No   Physically Abused: No   Sexually Abused: No    Review of Systems:  All other review of systems negative except as mentioned in the HPI.  Physical Exam: Vital signs BP (!) 144/72   Pulse 99   Temp 98.5 F (36.9 C) (Oral)   Resp (!) 25   Ht 6' 2"  (1.88 m)   Wt (!) 151 kg   SpO2 94%   BMI 42.74 kg/m   General:   Alert,  Well-developed, well-nourished, pleasant and cooperative in NAD Lungs:  Clear throughout to auscultation.   Heart:  Regular rate and rhythm; no murmurs, clicks, rubs,  or gallops. Abdomen:  Soft, nontender and nondistended. Normal bowel sounds.   Neuro/Psych:  Alert and cooperative. Normal mood and affect. A and O x 3   @Nicolena Schurman  Simonne Maffucci, MD, [facility] [phone] (pager) 01/26/2021 8:33 AM@

## 2021-01-26 NOTE — Discharge Instructions (Addendum)
I was unable to complete the colonoscopy this time. Sorry to tell you that. The areas I did see were free of polyps this time.  We placed a breathing tube and moved you to your abdomen and in multiple positions - despite this I could not get the scope completely into the colon.  I will discuss possible next steps - think at some point should have a different doctor try to complete an exam - considering referring to [facility].  I appreciate the opportunity to care for you. Gatha Mayer, MD, FACG YOU HAD AN ENDOSCOPIC PROCEDURE TODAY: Refer to the procedure report and other information in the discharge instructions given to you for any specific questions about what was found during the examination. If this information does not answer your questions, please call [place] office at [phone] to clarify.   YOU SHOULD EXPECT: Some feelings of bloating in the abdomen. Passage of more gas than usual. Walking can help get rid of the air that was put into your GI tract during the procedure and reduce the bloating. If you had a lower endoscopy (such as a colonoscopy or flexible sigmoidoscopy) you may notice spotting of blood in your stool or on the toilet paper. Some abdominal soreness may be present for a day or two, also.  DIET: Your first meal following the procedure should be a light meal and then it is ok to progress to your normal diet. A half-sandwich or bowl of soup is an example of a good first meal. Heavy or fried foods are harder to digest and may make you feel nauseous or bloated. Drink plenty of fluids but you should avoid alcoholic beverages for 24 hours. If you had a esophageal dilation, please see attached instructions for diet.    ACTIVITY: Your care partner should take you home directly after the procedure. You should plan to take it easy, moving slowly for the rest of the day. You can resume normal activity the day after the procedure however YOU SHOULD NOT DRIVE, use power tools, machinery or  perform tasks that involve climbing or major physical exertion for 24 hours (because of the sedation medicines used during the test).   SYMPTOMS TO REPORT IMMEDIATELY: A gastroenterologist can be reached at any hour. Please call [phone]  for any of the following symptoms:  Following lower endoscopy (colonoscopy, flexible sigmoidoscopy) Excessive amounts of blood in the stool  Significant tenderness, worsening of abdominal pains  Swelling of the abdomen that is new, acute  Fever of 100 or higher   FOLLOW UP:  If any biopsies were taken you will be contacted by phone or by letter within the next 1-3 weeks. Call [phone]  if you have not heard about the biopsies in 3 weeks.  Please also call with any specific questions about appointments or follow up tests.

## 2021-01-27 ENCOUNTER — Encounter (HOSPITAL_COMMUNITY): Payer: Self-pay | Admitting: Internal Medicine

## 2021-02-05 ENCOUNTER — Telehealth: Payer: Self-pay | Admitting: Internal Medicine

## 2021-02-05 NOTE — Telephone Encounter (Signed)
Called to follow-up after incomplete colonoscopy Reviewed things.  Patient's desire is to attempt it again a year from now.  I think that is reasonable there is a very rare chance of something significant developing in between he does have a suspected polyposis syndrome with negative genetic testing.  He is aware of the potential risks of not obtaining a complete colonoscopy at this time.  So we will plan to repeat this again at the hospital next year, I think he will need a larger abdominal binder than we had and we could consider using the enteroscope if necessary.  He knows to contact me for bowel habit changes bleeding etc. in the interim.

## 2021-02-05 NOTE — Telephone Encounter (Signed)
Needs November 2023 colonoscopy recall

## 2021-02-06 NOTE — Telephone Encounter (Signed)
November 2023 colonoscopy recall entered into Epic.

## 2021-03-16 ENCOUNTER — Encounter: Payer: Self-pay | Admitting: Family Medicine

## 2021-04-10 ENCOUNTER — Ambulatory Visit: Payer: Medicare Other | Admitting: Family Medicine

## 2021-04-28 ENCOUNTER — Other Ambulatory Visit: Payer: Self-pay

## 2021-04-28 ENCOUNTER — Encounter: Payer: Self-pay | Admitting: Family Medicine

## 2021-04-28 ENCOUNTER — Ambulatory Visit (INDEPENDENT_AMBULATORY_CARE_PROVIDER_SITE_OTHER): Payer: Medicare Other | Admitting: Family Medicine

## 2021-04-28 VITALS — BP 122/78 | HR 89 | Temp 97.6°F | Ht 74.0 in | Wt 323.5 lb

## 2021-04-28 DIAGNOSIS — E118 Type 2 diabetes mellitus with unspecified complications: Secondary | ICD-10-CM | POA: Diagnosis not present

## 2021-04-28 LAB — POCT GLYCOSYLATED HEMOGLOBIN (HGB A1C): Hemoglobin A1C: 6.4 % — AB (ref 4.0–5.6)

## 2021-04-28 MED ORDER — FREESTYLE LIBRE 3 SENSOR MISC: 1.0000 [IU] | 3 refills | Status: DC

## 2021-04-28 MED ORDER — TRULICITY 0.75 MG/0.5ML ~~LOC~~ SOAJ: 0.7500 mg | SUBCUTANEOUS | 6 refills | Status: DC

## 2021-04-28 NOTE — Assessment & Plan Note (Signed)
Congratulated on weight loss to date. He is motivated to continue weight loss efforts for goal weight <300lbs.  ?

## 2021-04-28 NOTE — Progress Notes (Signed)
? ? Patient ID: Dennis Ayers, male    DOB: 20-May-1950, 71 y.o.   MRN: [id number] ? ?This visit was conducted in person. ? ?BP 122/78   Pulse 89   Temp 97.6 ?F (36.4 ?C) (Temporal)   Ht '6\' 2"'$  (1.88 m)   Wt (!) 323 lb 8 oz (146.7 kg)   SpO2 95%   BMI 41.53 kg/m?   ? ?CC: DM f/u visit  ?Subjective:  ? ?HPI: ?Dennis Ayers is a 71 y.o. male presenting on 04/28/2021 for Diabetes (Here for 6 mo f/u.  Wants to discuss changing to FreeStyle Libre 2. ) ? ? ?Rough 6 months - wile with health issues.  ?Planning to be more active this spring/summer.  ?Needs to lose weight if he wants to get R knee replacement.  ? ?DM - does regularly check sugars with freestyle libre - occasional lows and highs, average cbg 143, requests libre2 which sends alerts. Compliant with antihyperglycemic regimen which includes: metformin '500mg'$  bid. Denies hypoglycemic symptoms. Lowest sugar 60s. Denies paresthesias, blurry vision. Last diabetic eye exam 06/2020. Glucometer brand: [employer]. Last foot exam: 03/2020 - DUE. DSME: 07/2016 at [facility].  ?Lab Results  ?Component Value Date  ? HGBA1C 6.4 (A) 04/28/2021  ? ?Diabetic Foot Exam - Simple   ?Simple Foot Form ?Diabetic Foot exam was performed with the following findings: Yes 04/28/2021  8:28 AM  ?Visual Inspection ?No deformities, no ulcerations, no other skin breakdown bilaterally: Yes ?Sensation Testing ?Intact to touch and monofilament testing bilaterally: Yes ?Pulse Check ?Posterior Tibialis and Dorsalis pulse intact bilaterally: Yes ?Comments ?  ? ?Lab Results  ?Component Value Date  ? MICROALBUR 1.0 03/27/2018  ?  ? ?   ? ?Relevant past medical, surgical, family and social history reviewed and updated as indicated. Interim medical history since our last visit reviewed. ?Allergies and medications reviewed and updated. ?Outpatient Medications Prior to Visit  ?Medication Sig Dispense Refill  ? acetaminophen (TYLENOL) 500 MG tablet Take 1,000 mg by mouth every 6 (six) hours as needed  for moderate pain or mild pain.    ? aspirin EC 81 MG tablet Take 81 mg by mouth daily.    ? atorvastatin (LIPITOR) 20 MG tablet Take 1 tablet (20 mg total) by mouth daily at 6 PM. 90 tablet 3  ? doxylamine, Sleep, (UNISOM) 25 MG tablet Take 50 mg by mouth at bedtime.    ? glucose blood (ONETOUCH ULTRA) test strip Check blood sugar once a day 100 strip 3  ? Krill Oil 300 MG CAPS Take 300 mg by mouth daily.    ? losartan (COZAAR) 100 MG tablet Take 1 tablet (100 mg total) by mouth daily. 90 tablet 3  ? Misc Natural Products (OSTEO BI-FLEX JOINT SHIELD PO) Take 1 capsule by mouth daily.     ? Multiple Vitamin (MULTIVITAMIN) tablet Take 1 tablet by mouth daily.    ? OneTouch Delica Lancets 73Z MISC Use as directed to check blood sugar once daily as needed.  Dx code: E11.9 100 each 3  ? Continuous Blood Gluc Sensor (FREESTYLE LIBRE 14 DAY SENSOR) MISC 1 Units by Does not apply route every 14 (fourteen) days. 6 each 3  ? metFORMIN (GLUCOPHAGE) 500 MG tablet Take 500 mg by mouth 2 (two) times daily with a meal.    ? metFORMIN (GLUCOPHAGE) 500 MG tablet Take 1 tablet (500 mg total) by mouth daily with breakfast.    ? ?No facility-administered medications prior to visit.  ?  ? ?  Per HPI unless specifically indicated in ROS section below ?Review of Systems ? ?Objective:  ?BP 122/78   Pulse 89   Temp 97.6 ?F (36.4 ?C) (Temporal)   Ht '6\' 2"'$  (1.88 m)   Wt (!) 323 lb 8 oz (146.7 kg)   SpO2 95%   BMI 41.53 kg/m?   ?Wt Readings from Last 3 Encounters:  ?04/28/21 (!) 323 lb 8 oz (146.7 kg)  ?01/26/21 (!) 332 lb 14.3 oz (151 kg)  ?01/20/21 (!) 333 lb (151 kg)  ?  ?  ?Physical Exam ?Vitals and nursing note reviewed.  ?Constitutional:   ?   Appearance: Normal appearance. He is not ill-appearing.  ?Eyes:  ?   Extraocular Movements: Extraocular movements intact.  ?   Conjunctiva/sclera: Conjunctivae normal.  ?   Pupils: Pupils are equal, round, and reactive to light.  ?Cardiovascular:  ?   Rate and Rhythm: Normal rate and regular  rhythm.  ?   Pulses: Normal pulses.  ?   Heart sounds: Normal heart sounds. No murmur heard. ?Pulmonary:  ?   Effort: Pulmonary effort is normal. No respiratory distress.  ?   Breath sounds: Normal breath sounds. No wheezing, rhonchi or rales.  ?Musculoskeletal:  ?   Right lower leg: No edema.  ?   Left lower leg: No edema.  ?   Comments: See HPI for foot exam if done  ?Skin: ?   General: Skin is warm and dry.  ?   Findings: No rash.  ?Neurological:  ?   Mental Status: He is alert.  ?Psychiatric:     ?   Mood and Affect: Mood normal.     ?   Behavior: Behavior normal.  ? ?   ?Results for orders placed or performed in visit on 04/28/21  ?POCT glycosylated hemoglobin (Hb A1C)  ?Result Value Ref Range  ? Hemoglobin A1C 6.4 (A) 4.0 - 5.6 %  ? HbA1c POC (<> result, manual entry)    ? HbA1c, POC (prediabetic range)    ? HbA1c, POC (controlled diabetic range)    ? ? ?Assessment & Plan:  ?This visit occurred during the SARS-CoV-2 public health emergency.  Safety protocols were in place, including screening questions prior to the visit, additional usage of staff PPE, and extensive cleaning of exam room while observing appropriate contact time as indicated for disinfecting solutions.  ? ?Problem List Items Addressed This Visit   ? ? Morbid obesity ([place])  ?  Congratulated on weight loss to date. He is motivated to continue weight loss efforts for goal weight <300lbs.  ?  ?  ? Relevant Medications  ? Dulaglutide (TRULICITY) 3.35 KT/6.2BW SOPN  ? metFORMIN (GLUCOPHAGE) 500 MG tablet  ? Controlled diabetes mellitus type 2 with complications ([place]) - Primary  ?  Chronic, stable. Continue current regimen.  ?Will send Freestyle Libre 3.  ?Interested in [place]. Discussed mechanism of action of medication as well as side effects of nausea, constipation, pancreatitis to monitor. No fmhx thyroid cancer.  ?Will trial trulicity 0.'75mg'$  weekly and if started will drop metformin to '500mg'$  once daily.  ?RTC 6 mo DM f/u visit.  ?  ?  ? Relevant  Medications  ? Dulaglutide (TRULICITY) 3.89 HT/3.4KA SOPN  ? metFORMIN (GLUCOPHAGE) 500 MG tablet  ? Other Relevant Orders  ? POCT glycosylated hemoglobin (Hb A1C) (Completed)  ?  ? ?Meds ordered this encounter  ?Medications  ? Continuous Blood Gluc Sensor (FREESTYLE LIBRE 3 SENSOR) MISC  ?  Sig: 1 Units by  Does not apply route as directed. E11.8. Place 1 sensor on the skin every 14 days. Use to check glucose continuously  ?  Dispense:  6 each  ?  Refill:  3  ? Dulaglutide (TRULICITY) 7.06 CB/7.6EG SOPN  ?  Sig: Inject 0.75 mg into the skin once a week.  ?  Dispense:  2 mL  ?  Refill:  6  ? ?Orders Placed This Encounter  ?Procedures  ? POCT glycosylated hemoglobin (Hb A1C)  ? ? ? ?Patient Instructions  ?We will send Freestyle Libre 3 to your pharmacy.  ?I have also sent trulicity 0.'75mg'$  weekly to your pharmacy.  ?Price out and let us know if any trouble  ?If you start trulicity, drop metformin dose to once daily.  ?Return as previously scheduled in 5-6 months for physical.  ?Good to see you today! ? ?Follow up plan: ?Return if symptoms worsen or fail to improve. ? ?Ria Bush, MD   ?

## 2021-04-28 NOTE — Patient Instructions (Addendum)
We will send Freestyle Libre 3 to your pharmacy.  ?I have also sent trulicity 0.'75mg'$  weekly to your pharmacy.  ?Price out and let us know if any trouble  ?If you start trulicity, drop metformin dose to once daily.  ?Return as previously scheduled in 5-6 months for physical.  ?Good to see you today! ?

## 2021-04-28 NOTE — Assessment & Plan Note (Addendum)
Chronic, stable. Continue current regimen.  ?Will send Freestyle Libre 3.  ?Interested in [place]. Discussed mechanism of action of medication as well as side effects of nausea, constipation, pancreatitis to monitor. No fmhx thyroid cancer.  ?Will trial trulicity 0.'75mg'$  weekly and if started will drop metformin to '500mg'$  once daily.  ?RTC 6 mo DM f/u visit.  ?

## 2021-04-29 ENCOUNTER — Telehealth: Payer: Self-pay

## 2021-04-29 NOTE — Progress Notes (Signed)
?  Transition CCM to Self Care ? ?Patient contacted to inform they have achieved their CCM goals and no longer need to be contacted as frequently. Patient advised services will still be available to them if they would like to reach out or have any new health concerns. Verified patient had contact information to pharmacist and health concierge on hand. Patient made aware CCM services would be continued if desired. Patient consented to cancel future CCM appointments. ? ?Charlene Brooke, CPP notified ? ?Marijean Niemann, RMA ?Clinical Pharmacy Assistant ?[phone] ? ? ? ?

## 2021-06-27 ENCOUNTER — Other Ambulatory Visit: Payer: Self-pay | Admitting: Family Medicine

## 2021-06-30 NOTE — Telephone Encounter (Signed)
Per 04/28/21 OV notes, pt was to decrease metformin 500 mg to 1 tab daily if he starts Trulicity. ? ?Spoke with pt asking if he started Trulicity and is now taking metformin 500 mg once a day.  Pt confirms he is.  Notified him I will send a new rx with new directions.  Pt verbalizes understanding.  ?

## 2021-07-12 ENCOUNTER — Other Ambulatory Visit: Payer: Self-pay | Admitting: Family Medicine

## 2021-07-15 DIAGNOSIS — H2513 Age-related nuclear cataract, bilateral: Secondary | ICD-10-CM | POA: Diagnosis not present

## 2021-07-15 LAB — HM DIABETES EYE EXAM

## 2021-07-17 ENCOUNTER — Encounter: Payer: Self-pay | Admitting: Family Medicine

## 2021-07-17 NOTE — Telephone Encounter (Signed)
Updated pt's chart.  

## 2021-08-05 ENCOUNTER — Telehealth: Payer: Medicare Other

## 2021-08-12 ENCOUNTER — Other Ambulatory Visit: Payer: Self-pay | Admitting: Family Medicine

## 2021-08-16 ENCOUNTER — Encounter: Payer: Self-pay | Admitting: Family Medicine

## 2021-08-28 ENCOUNTER — Ambulatory Visit: Payer: Medicare Other | Admitting: Internal Medicine

## 2021-08-28 ENCOUNTER — Encounter: Payer: Self-pay | Admitting: Internal Medicine

## 2021-08-28 VITALS — BP 122/78 | HR 93 | Ht 74.5 in | Wt 328.0 lb

## 2021-08-28 DIAGNOSIS — I10 Essential (primary) hypertension: Secondary | ICD-10-CM | POA: Diagnosis not present

## 2021-08-28 DIAGNOSIS — I2584 Coronary atherosclerosis due to calcified coronary lesion: Secondary | ICD-10-CM | POA: Diagnosis not present

## 2021-08-28 DIAGNOSIS — E1169 Type 2 diabetes mellitus with other specified complication: Secondary | ICD-10-CM

## 2021-08-28 DIAGNOSIS — I2510 Atherosclerotic heart disease of native coronary artery without angina pectoris: Secondary | ICD-10-CM | POA: Diagnosis not present

## 2021-08-28 DIAGNOSIS — E785 Hyperlipidemia, unspecified: Secondary | ICD-10-CM

## 2021-08-28 NOTE — Progress Notes (Signed)
Follow-up Outpatient Visit Date: 08/28/2021  Primary Care Provider: Ria Bush, MD [place] [address]  Chief Complaint: Follow-up coronary artery calcification  HPI:  [name] is a 71 y.o. male with history of coronary artery calcification, type 2 diabetes mellitus, hypertension, morbid obesity, obstructive sleep apnea, and degenerative joint disease, who presents for follow-up of coronary artery calcification.  I last saw himi in 11/2020, at which time he was feeling well.  Due to suboptimal BP control, we agreed to increase losartan to 100 mg daily.  Today, [name] reports that he is feeling well.  He has tolerated increased dose of lisinopril well.  He is trying to lose weight, which prompted a switch from metformin to Trulicity.  He denies chest pain, palpitations, lightheadedness, and edema.  He gets a little out of breath when he is outside in the heat but otherwise has not experienced any dyspnea.  He tries to remain active, though his mobility is limited by knee problems.  He likes to spend time in the garden, tending to his flowers.  --------------------------------------------------------------------------------------------------  Cardiovascular History & Procedures: Cardiovascular Problems: Coronary artery calcification Dilated thoracic aorta by echo; upper normal on cardiac CT   Risk Factors: Hypertension, diabetes mellitus, obesity, sedentary lifestyle, male gender, age greater than 34, and family history   Cath/PCI: None   CV Surgery: None   EP Procedures and Devices: None   Non-Invasive Evaluation(s): Exercise tolerance test (08/23/2016): Poor exercise capacity.  No ischemia at workload achieved (4.8 METS).  Intermediate risk study ([facility] treadmill score 3) due to poor exercise capacity. TTE (06/29/16): Normal LV size with mild LVH. LVEF 55-60% with normal wall motion. Normal diastolic function. Mildly dilated aortic root, measuring  4.0 cm. Mild left atrial enlargement. Normal RV size and function. No significant valvular abnormalities. Coronary calcium score (06/09/16): Calcification of all 3 coronary arteries noted within a score of 102 Agatston units (53rd percentile for age and sex matched cohort). Ascending aorta measuring 3.6 cm in diameter. No significant extracardiac abnormalities in the visualized thorax.  Recent CV Pertinent Labs: Lab Results  Component Value Date   CHOL 111 10/01/2020   HDL 50.50 10/01/2020   LDLCALC 46 10/01/2020   TRIG 72.0 10/01/2020   CHOLHDL 2 10/01/2020   INR 1.04 07/19/2012   K 4.8 12/31/2020   BUN 22 12/31/2020   CREATININE 1.04 12/31/2020    Past medical and surgical history were reviewed and updated in EPIC.  Current Meds  Medication Sig   acetaminophen (TYLENOL) 500 MG tablet Take 1,000 mg by mouth every 6 (six) hours as needed for moderate pain or mild pain.   aspirin EC 81 MG tablet Take 81 mg by mouth daily.   atorvastatin (LIPITOR) 20 MG tablet TAKE 1 TABLET BY MOUTH  DAILY AT 6 PM.   Continuous Blood Gluc Sensor (FREESTYLE LIBRE 3 SENSOR) MISC 1 Units by Does not apply route as directed. E11.8. Place 1 sensor on the skin every 14 days. Use to check glucose continuously   doxylamine, Sleep, (UNISOM) 25 MG tablet Take 50 mg by mouth at bedtime.   Dulaglutide (TRULICITY) 8.58 IF/0.2DX SOPN Inject 0.75 mg into the skin once a week.   glucose blood (ONETOUCH ULTRA) test strip Check blood sugar once a day   Krill Oil 300 MG CAPS Take 300 mg by mouth daily.   losartan (COZAAR) 100 MG tablet Take 1 tablet (100 mg total) by mouth daily.   metFORMIN (GLUCOPHAGE) 500 MG  tablet Take 500 mg by mouth daily with breakfast.   Misc Natural Products (OSTEO BI-FLEX JOINT SHIELD PO) Take 1 capsule by mouth daily.    Multiple Vitamin (MULTIVITAMIN) tablet Take 1 tablet by mouth daily.   OneTouch Delica Lancets 74B MISC Use as directed to check blood sugar once daily as needed.  Dx code:  E11.9    Allergies: Lisinopril  Social History   Tobacco Use   Smoking status: Never   Smokeless tobacco: Never  Vaping Use   Vaping Use: Never used  Substance Use Topics   Alcohol use: No   Drug use: No    Family History  Problem Relation Age of Onset   Ovarian cancer Mother    Ulcers Mother        stomach   Coronary artery disease Father 74       CABG X4   Hypertension Father    Stroke Father 87   Heart attack Father 8   Heart attack Sister 21   Heart Problems Paternal Aunt    Heart attack Paternal Aunt    Heart attack Paternal Uncle    Heart attack Paternal Uncle    Diabetes Neg Hx    Prostate cancer Neg Hx    Breast cancer Neg Hx    Colon cancer Neg Hx    Depression Neg Hx    Alcohol abuse Neg Hx    Colon polyps Neg Hx    Stomach cancer Neg Hx    Rectal cancer Neg Hx    Esophageal cancer Neg Hx     Review of Systems: A 12-system review of systems was performed and was negative except as noted in the HPI.  --------------------------------------------------------------------------------------------------  Physical Exam: BP 122/78 (BP Location: Right Arm, Patient Position: Sitting, Cuff Size: Large)   Pulse 93   Ht 6' 2.5" (1.892 m)   Wt (!) 328 lb (148.8 kg)   SpO2 97%   BMI 41.55 kg/m   General:  NAD. Neck: No JVD or HJR. Lungs: Clear to auscultation bilaterally without wheezes or crackles. Heart: Regular rate and rhythm without murmurs, rubs, or gallops. Abdomen: Soft, nontender, nondistended. Extremities: No lower extremity edema.  EKG: Normal sinus rhythm with left anterior fascicular block and poor R wave progression.  No significant change from prior tracing on 12/18/2020.  Lab Results  Component Value Date   WBC 4.5 10/01/2020   HGB 16.0 10/01/2020   HCT 46.8 10/01/2020   MCV 93.6 10/01/2020   PLT 208.0 10/01/2020    Lab Results  Component Value Date   NA 140 12/31/2020   K 4.8 12/31/2020   CL 102 12/31/2020   CO2 22  12/31/2020   BUN 22 12/31/2020   CREATININE 1.04 12/31/2020   GLUCOSE 163 (H) 12/31/2020   ALT 23 10/01/2020    Lab Results  Component Value Date   CHOL 111 10/01/2020   HDL 50.50 10/01/2020   LDLCALC 46 10/01/2020   TRIG 72.0 10/01/2020   CHOLHDL 2 10/01/2020    --------------------------------------------------------------------------------------------------  ASSESSMENT AND PLAN: Coronary artery calcification: No angina reported.  Continue current medications to prevent progression of CAD, including aspirin and atorvastatin.  Hypertension: Blood pressure well controlled today.  Continue losartan 100 mg daily.  Hyperlipidemia associated with type 2 diabetes mellitus: LDL at goal on last check in 09/2020.  Continue atorvastatin 20 mg daily.  Ongoing management of DM per Dr. [name].  Morbid obesity: BMI remains greater than 40.  Weight loss encouraged through diet and exercise.  Follow-up: Return to clinic in 1 year.  Nelva Bush, MD 08/28/2021 10:54 AM

## 2021-08-28 NOTE — Patient Instructions (Signed)
Medication Instructions:  ? ?Your physician recommends that you continue on your current medications as directed. Please refer to the Current Medication list given to you today. ? ?*If you need a refill on your cardiac medications before your next appointment, please call your pharmacy* ? ? ?Lab Work: ? ?None ordered ? ?Testing/Procedures: ? ?None ordered ? ? ?Follow-Up: ?At [facility], you and your health needs are our priority.  As part of our continuing mission to provide you with exceptional heart care, we have created designated Provider Care Teams.  These Care Teams include your primary Cardiologist (physician) and Advanced Practice Providers (APPs -  Physician Assistants and Nurse Practitioners) who all work together to provide you with the care you need, when you need it. ? ?We recommend signing up for the patient portal called "MyChart".  Sign up information is provided on this After Visit Summary.  MyChart is used to connect with patients for Virtual Visits (Telemedicine).  Patients are able to view lab/test results, encounter notes, upcoming appointments, etc.  Non-urgent messages can be sent to your provider as well.   ?To learn more about what you can do with MyChart, go to [email or web address].   ? ?Your next appointment:   ?1 year(s) ? ?The format for your next appointment:   ?In Person ? ?Provider:   ?You may see Christopher End, MD or one of the following Advanced Practice Providers on your designated Care Team:   ?Christopher Berge, NP ?Ryan Dunn, PA-C ?Cadence Furth, PA-C ? ?Important Information About Sugar ? ? ? ? ? ? ?

## 2021-08-30 ENCOUNTER — Encounter: Payer: Self-pay | Admitting: Internal Medicine

## 2021-09-28 ENCOUNTER — Other Ambulatory Visit: Payer: Self-pay | Admitting: Family Medicine

## 2021-09-28 DIAGNOSIS — H903 Sensorineural hearing loss, bilateral: Secondary | ICD-10-CM | POA: Diagnosis not present

## 2021-09-28 DIAGNOSIS — H6123 Impacted cerumen, bilateral: Secondary | ICD-10-CM | POA: Diagnosis not present

## 2021-10-01 ENCOUNTER — Other Ambulatory Visit: Payer: Self-pay | Admitting: Family Medicine

## 2021-10-01 ENCOUNTER — Ambulatory Visit (INDEPENDENT_AMBULATORY_CARE_PROVIDER_SITE_OTHER): Payer: Medicare Other

## 2021-10-01 VITALS — Wt 328.0 lb

## 2021-10-01 DIAGNOSIS — Z0000 Encounter for general adult medical examination without abnormal findings: Secondary | ICD-10-CM | POA: Diagnosis not present

## 2021-10-01 DIAGNOSIS — Z125 Encounter for screening for malignant neoplasm of prostate: Secondary | ICD-10-CM

## 2021-10-01 DIAGNOSIS — E1169 Type 2 diabetes mellitus with other specified complication: Secondary | ICD-10-CM

## 2021-10-01 DIAGNOSIS — D751 Secondary polycythemia: Secondary | ICD-10-CM

## 2021-10-01 DIAGNOSIS — E118 Type 2 diabetes mellitus with unspecified complications: Secondary | ICD-10-CM

## 2021-10-01 NOTE — Progress Notes (Signed)
Virtual Visit via Telephone Note  I connected with  Dennis Ayers on 10/01/21 at  8:15 AM EDT by telephone and verified that I am speaking with the correct person using two identifiers.  Location: Patient: home Provider: [place] Persons participating in the virtual visit: [place]   I discussed the limitations, risks, security and privacy concerns of performing an evaluation and management service by telephone and the availability of in person appointments. The patient expressed understanding and agreed to proceed.  Interactive audio and video telecommunications were attempted between this nurse and patient, however failed, due to patient having technical difficulties OR patient did not have access to video capability.  We continued and completed visit with audio only.  Some vital signs may be absent or patient reported.   Dionisio David, LPN  Subjective:   Dennis Ayers is a 71 y.o. male who presents for Medicare Annual/Subsequent preventive examination.  Review of Systems     Cardiac Risk Factors include: advanced age (>50mn, >>47women);male gender;diabetes mellitus     Objective:    There were no vitals filed for this visit. There is no height or weight on file to calculate BMI.     10/01/2021    8:17 AM 01/26/2021    6:48 AM 09/23/2020    1:17 PM 10/02/2019    9:58 AM 09/25/2018    9:11 AM 09/09/2017    9:14 AM 10/29/2016    6:58 AM  Advanced Directives  Does Patient Have a Medical Advance Directive? Yes No Yes Yes Yes Yes Yes  Type of AParamed[place]of [place]ford[place]Living will  [place] will [place]Living will [place]Living will [place]Living will [place]Living will  Does patient want to make changes to medical advance directive? Yes (Inpatient - patient defers changing a medical advance directive and declines  information at this time)        Copy of [place] Chart? Yes - validated most recent copy scanned in chart (See row information)  Yes - validated most recent copy scanned in chart (See row information) Yes - validated most recent copy scanned in chart (See row information) Yes - validated most recent copy scanned in chart (See row information) Yes No - copy requested  Would patient like information on creating a medical advance directive?  No - Patient declined         Current Medications (verified) Outpatient Encounter Medications as of 10/01/2021  Medication Sig   acetaminophen (TYLENOL) 500 MG tablet Take 1,000 mg by mouth every 6 (six) hours as needed for moderate pain or mild pain.   aspirin EC 81 MG tablet Take 81 mg by mouth daily.   atorvastatin (LIPITOR) 20 MG tablet TAKE 1 TABLET BY MOUTH DAILY AT  6 PM.   Continuous Blood Gluc Sensor (FREESTYLE LIBRE 3 SENSOR) MISC 1 Units by Does not apply route as directed. E11.8. Place 1 sensor on the skin every 14 days. Use to check glucose continuously   doxylamine, Sleep, (UNISOM) 25 MG tablet Take 50 mg by mouth at bedtime.   Dulaglutide (TRULICITY) 01.51MVO/1.6WVSOPN Inject 0.75 mg into the skin once a week.   glucose blood (ONETOUCH ULTRA) test strip Check blood sugar once a day   Krill Oil 300 MG CAPS Take 300 mg by mouth daily.   losartan (COZAAR) 100 MG tablet Take 1 tablet (100 mg total)  by mouth daily.   losartan (COZAAR) 50 MG tablet Take 50 mg by mouth daily.   metFORMIN (GLUCOPHAGE) 500 MG tablet Take 500 mg by mouth daily with breakfast.   Misc Natural Products (OSTEO BI-FLEX JOINT SHIELD PO) Take 1 capsule by mouth daily.    Multiple Vitamin (MULTIVITAMIN) tablet Take 1 tablet by mouth daily.   OneTouch Delica Lancets 84X MISC Use as directed to check blood sugar once daily as needed.  Dx code: E11.9   No facility-administered encounter medications on file as of 10/01/2021.    Allergies  (verified) Lisinopril   History: Past Medical History:  Diagnosis Date   Benign neoplasm of cecum - 25 mm polyp 08/06/2015   Coronary artery calcification    Diabetes mellitus without complication (HCC)    DJD (degenerative joint disease) of knee    [place] steroid injection (01/2014)   Family history of ovarian cancer    Hyperglycemia    Hyperlipidemia    Hypertension    Jaundice    with icterus, when very young, treated and resolved   Knee pain    Morbid obesity ([place])    OSA (obstructive sleep apnea)    mild to mod, working on weight loss, no cpap needed per test   Osteoarthritis of knee 07/23/2011   blat s/p L TKR ([place])   Redundant colon 11/16/2017   Severe obesity (BMI >= 40) ([place]) 02/15/2008   Sleep apnea    does not have a cpap   Viral hepatitis 8th grade   has had jaundice in past, unsure of cause ?Hep A   Past Surgical History:  Procedure Laterality Date   CHOLECYSTECTOMY  1998   COLONOSCOPY  08/20/2011   4 adenomatous polyps - rpt 3 yrs Gatha Mayer)   COLONOSCOPY  07/22/2015   mult TAs, severe diverticulosis, one large 2.5cm cecal polyp pending resection Carlean Purl)   COLONOSCOPY N/A 09/10/2015   large TA, rpt 1 yr Gatha Mayer, MD)   COLONOSCOPY  11/2017   7 TAs, 3 SSPs, diverticulosis, rpt 2 yrs Carlean Purl)   COLONOSCOPY  12/2019   multiple TA and SSP, diverticulosis with excess colon looping, rpt 1 year ([place])   COLONOSCOPY WITH PROPOFOL N/A 10/29/2016   TA, rpt 1 yr Carlean Purl, Ofilia Neas, MD)   COLONOSCOPY WITH PROPOFOL N/A 01/26/2021   diverticulosis, int hem, significant looping of colon making complete exam difficult - rpt 1 yr Gatha Mayer, MD)   EYE SURGERY Right    laser   HOT HEMOSTASIS N/A 10/29/2016   Procedure: HOT HEMOSTASIS (ARGON PLASMA COAGULATION/BICAP);  Surgeon: Gatha Mayer, MD;  Location: [facility];  Service: Endoscopy;  Laterality: N/A;   TONSILLECTOMY     TOTAL KNEE ARTHROPLASTY Left 07/25/2012   Surgeon: Johnny Bridge, MD   Varus Gonarthrosis  04/02/2002   Dr. Alphonzo Severance   Family History  Problem Relation Age of Onset   Ovarian cancer Mother    Ulcers Mother        stomach   Coronary artery disease Father 86       CABG X4   Hypertension Father    Stroke Father 60   Heart attack Father 79   Heart attack Sister 59   Heart Problems Paternal Aunt    Heart attack Paternal Aunt    Heart attack Paternal Uncle    Heart attack Paternal Uncle    Diabetes Neg Hx    Prostate cancer Neg Hx    Breast cancer Neg  Hx    Colon cancer Neg Hx    Depression Neg Hx    Alcohol abuse Neg Hx    Colon polyps Neg Hx    Stomach cancer Neg Hx    Rectal cancer Neg Hx    Esophageal cancer Neg Hx    Social History   Socioeconomic History   Marital status: Married    Spouse name: Not on file   Number of children: 1   Years of education: Not on file   Highest education level: Not on file  Occupational History   Occupation: Self-employed  Tobacco Use   Smoking status: Never   Smokeless tobacco: Never  Vaping Use   Vaping Use: Never used  Substance and Sexual Activity   Alcohol use: No   Drug use: No   Sexual activity: Not Currently  Other Topics Concern   Not on file  Social History Narrative   Caffeine: 2-3 diet sodas/day   Lives with wife   1 grown son.   Occupation: Self-employed; Psychologist, forensic   Activity: some golf, limited by knee pain   Diet: good water, daily vegetables   Social Determinants of Health   Financial Resource Strain: Low Risk  (10/01/2021)   Overall Financial Resource Strain (CARDIA)    Difficulty of Paying Living Expenses: Not hard at all  Food Insecurity: No Food Insecurity (10/01/2021)   Hunger Vital Sign    Worried About Running Out of Food in the Last Year: Never true    Ran Out of Food in the Last Year: Never true  Transportation Needs: No Transportation Needs (10/01/2021)   PRAPARE - Hydrologist (Medical): No     Lack of Transportation (Non-Medical): No  Physical Activity: Insufficiently Active (10/01/2021)   Exercise Vital Sign    Days of Exercise per Week: 2 days    Minutes of Exercise per Session: 10 min  Stress: No Stress Concern Present (10/01/2021)   [place]    Feeling of Stress : Not at all  Social Connections: Socially Isolated (10/01/2021)   Social Connection and Isolation Panel [NHANES]    Frequency of Communication with Friends and Family: Once a week    Frequency of Social Gatherings with Friends and Family: Once a week    Attends Religious Services: Never    Marine scientist or Organizations: No    Attends Music therapist: Never    Marital Status: Married    Tobacco Counseling Counseling given: Not Answered   Clinical Intake:  Pre-visit preparation completed: Yes  Pain : No/denies pain     Nutritional Risks: None Diabetes: Yes CBG done?: No Did pt. bring in CBG monitor from home?: No  How often do you need to have someone help you when you read instructions, pamphlets, or other written materials from your doctor or pharmacy?: 1 - Never  Diabetic?yes Nutrition Risk Assessment:  Has the patient had any N/V/D within the last 2 months?  No  Does the patient have any non-healing wounds?  No  Has the patient had any unintentional weight loss or weight gain?  No   Diabetes:  Is the patient diabetic?  Yes  If diabetic, was a CBG obtained today?  No  Did the patient bring in their glucometer from home?  No  How often do you monitor your CBG's? Continuous monitoring   Financial Strains and Diabetes Management:  Are you having any  financial strains with the device, your supplies or your medication? No .  Does the patient want to be seen by Chronic Care Management for management of their diabetes?  No  Would the patient like to be referred to a Nutritionist or for Diabetic  Management?  No   Diabetic Exams:  Diabetic Eye Exam: Completed 07/15/21.  Pt has been advised about the importance in completing this exam.  Diabetic Foot Exam: Completed 04/28/21. Pt has been advised about the importance in completing this exam.   Interpreter Needed?: No  Information entered by :: Kirke Shaggy, LPN   Activities of Daily Living    10/01/2021    8:18 AM 09/28/2021   11:23 PM  In your present state of health, do you have any difficulty performing the following activities:  Hearing? 0 0  Vision? 0 0  Difficulty concentrating or making decisions? 0 0  Walking or climbing stairs? 1 1  Dressing or bathing? 0 0  Doing errands, shopping? 0 0  Preparing Food and eating ? N N  Using the Toilet? N N  In the past six months, have you accidently leaked urine? N N  Do you have problems with loss of bowel control? N N  Managing your Medications? N N  Managing your Finances? N N  Housekeeping or managing your Housekeeping? N N    Patient Care Team: Ria Bush, MD as PCP - General (Family Medicine) End, Harrell Gave, MD as PCP - Cardiology (Cardiology) Marchia Bond, MD as Consulting Physician (Orthopedic Surgery) End, Harrell Gave, MD as Consulting Physician (Cardiology) Gatha Mayer, MD as Consulting Physician (Gastroenterology) Macarthur Critchley, [place] as Consulting Physician (Optometry) Debbora Dus, [facility] as Pharmacist (Pharmacist)  Indicate any recent Medical Services you may have received from other than [facility] providers in the past year (date may be approximate).     Assessment:   This is a routine wellness examination for [place].  Hearing/Vision screen Hearing Screening - Comments:: No aids Vision Screening - Comments:: Wears glasses- Macarthur Critchley, MD  Dietary issues and exercise activities discussed: Current Exercise Habits: Home exercise routine, Type of exercise: walking, Time (Minutes): 10, Frequency (Times/Week): 2, Weekly Exercise (Minutes/Week): 20,  Intensity: Mild   Goals Addressed             This Visit's Progress    DIET - EAT MORE FRUITS AND VEGETABLES         Depression Screen    10/01/2021    8:15 AM 09/23/2020    1:19 PM 10/02/2019   10:01 AM 09/25/2018    9:12 AM 09/09/2017    9:13 AM 09/03/2016    9:00 AM 07/10/2015    8:58 AM  PHQ 2/9 Scores  PHQ - 2 Score 0 0 0 0 0 0 0  PHQ- 9 Score 0 0 0 1 0      Fall Risk    10/01/2021    8:17 AM 09/28/2021   11:23 PM 09/23/2020    1:19 PM 10/02/2019   10:00 AM 09/25/2018    9:12 AM  Fall Risk   Falls in the past year? 0 0 0 0 1  Number falls in past yr: 0  0 0   Injury with Fall? 0 0 0 0 0  Comment     lost balance  Risk for fall due to : No Fall Risks  Medication side effect Medication side effect History of fall(s);Medication side effect  Follow up Falls evaluation completed  Falls evaluation completed;Falls prevention discussed Falls  evaluation completed;Falls prevention discussed Falls evaluation completed;Falls prevention discussed    FALL RISK PREVENTION PERTAINING TO THE HOME:  Any stairs in or around the home? No  If so, are there any without handrails? No  Home free of loose throw rugs in walkways, pet beds, electrical cords, etc? Yes  Adequate lighting in your home to reduce risk of falls? Yes   ASSISTIVE DEVICES UTILIZED TO PREVENT FALLS:  Life alert? No  Use of a cane, walker or w/c? No  Grab bars in the bathroom? Yes  Shower chair or bench in shower? Yes  Elevated toilet seat or a handicapped toilet? No    Cognitive Function:     09/23/2020    1:23 PM 10/02/2019   10:02 AM 09/25/2018    9:16 AM 09/09/2017    9:14 AM 09/03/2016    9:02 AM  MMSE - Mini Mental State Exam  Orientation to time '5 5 5 5 5  '$ Orientation to Place '5 5 5 5 5  '$ Registration '3 3 3 3 3  '$ Attention/ Calculation '5 5 5 '$ 0 0  Recall '3 3 3 3 3  '$ Language- name 2 objects   0 0 0  Language- repeat '1 1 1 1 1  '$ Language- follow 3 step command   0 3 3  Language- read & follow direction   0 0 0   Write a sentence   0 0 0  Copy design   0 0 0  Total score   '22 20 20        '$ 10/01/2021    8:21 AM  6CIT Screen  What Year? 0 points  What month? 0 points  What time? 0 points  Count back from 20 0 points  Months in reverse 0 points  Repeat phrase 0 points  Total Score 0 points    Immunizations Immunization History  Administered Date(s) Administered   Hepatitis B, adult 03/11/2017, 06/14/2017, 10/18/2017   Influenza Whole 11/23/2011   Influenza, High Dose Seasonal PF 11/03/2014, 11/21/2015, 10/01/2016, 10/20/2018, 11/01/2019, 11/02/2019, 09/26/2020   Influenza-Unspecified 09/30/2017   PFIZER Comirnaty(Gray Top)Covid-19 Tri-Sucrose Vaccine 05/23/2020   PFIZER(Purple Top)SARS-COV-2 Vaccination 04/02/2019, 04/25/2019, 11/22/2019   PNEUMOCOCCAL CONJUGATE-20 08/07/2020   Pfizer Covid-19 Vaccine Bivalent Booster 66yr & up 11/28/2020, 07/08/2021   Pneumococcal Conjugate-13 07/10/2015   Pneumococcal Polysaccharide-23 11/21/2015   Td 03/23/2002   Tdap 07/10/2015   Zoster Recombinat (Shingrix) 11/18/2017, 02/22/2018   Zoster, Live 07/23/2011    TDAP status: Up to date  Flu Vaccine status: Up to date  Pneumococcal vaccine status: Up to date  Covid-19 vaccine status: Completed vaccines  Qualifies for Shingles Vaccine? Yes   Zostavax completed Yes   Shingrix Completed?: Yes  Screening Tests Health Maintenance  Topic Date Due   COVID-19 Vaccine (7 - Pfizer risk series) 09/02/2021   INFLUENZA VACCINE  09/22/2021   HEMOGLOBIN A1C  10/29/2021   COLONOSCOPY (Pts 45-45yrInsurance coverage will need to be confirmed)  01/26/2022   FOOT EXAM  04/29/2022   OPHTHALMOLOGY EXAM  07/16/2022   TETANUS/TDAP  07/09/2025   Pneumonia Vaccine 6537Years old  Completed   Hepatitis C Screening  Completed   Zoster Vaccines- Shingrix  Completed   HPV VACCINES  Aged Out    Health Maintenance  Health Maintenance Due  Topic Date Due   COVID-19 Vaccine (7 - Pfizer risk series)  09/02/2021   INFLUENZA VACCINE  09/22/2021    Colorectal cancer screening: Type of screening: Colonoscopy. Completed 01/26/21. Repeat every 1 years  Lung Cancer Screening: (Low Dose CT Chest recommended if Age 21-80 years, 30 pack-year currently smoking OR have quit w/in 15years.) does not qualify.    Additional Screening:  Hepatitis C Screening: does qualify; Completed 06/27/15  Vision Screening: Recommended annual ophthalmology exams for early detection of glaucoma and other disorders of the eye. Is the patient up to date with their annual eye exam?  Yes  Who is the provider or what is the name of the office in which the patient attends annual eye exams? Dr.Scott If pt is not established with a provider, would they like to be referred to a provider to establish care? No .   Dental Screening: Recommended annual dental exams for proper oral hygiene  Community Resource Referral / Chronic Care Management: CRR required this visit?  No   CCM required this visit?  No      Plan:     I have personally reviewed and noted the following in the patient's chart:   Medical and social history Use of alcohol, tobacco or illicit drugs  Current medications and supplements including opioid prescriptions. Patient is not currently taking opioid prescriptions. Functional ability and status Nutritional status Physical activity Advanced directives List of other physicians Hospitalizations, surgeries, and ER visits in previous 12 months Vitals Screenings to include cognitive, depression, and falls Referrals and appointments  In addition, I have reviewed and discussed with patient certain preventive protocols, quality metrics, and best practice recommendations. A written personalized care plan for preventive services as well as general preventive health recommendations were provided to patient.     Dionisio David, LPN   4/48/1856   Nurse Notes: none

## 2021-10-01 NOTE — Patient Instructions (Signed)
Dennis Ayers , Thank you for taking time to come for your Medicare Wellness Visit. I appreciate your ongoing commitment to your health goals. Please review the following plan we discussed and let me know if I can assist you in the future.   Screening recommendations/referrals: Colonoscopy: 01/26/21 Recommended yearly ophthalmology/optometry visit for glaucoma screening and checkup Recommended yearly dental visit for hygiene and checkup  Vaccinations: Influenza vaccine: 09/26/20 Pneumococcal vaccine: 08/07/20 Tdap vaccine: 07/10/15 Shingles vaccine: Zostavax 07/23/11  Shingrix 11/18/17, 02/22/18   Covid-19: 04/02/19, 04/25/19, 11/22/19, 05/23/20, 11/28/20, 07/08/21  Advanced directives: yes  Conditions/risks identified: none  Next appointment: Follow up in one year for your annual wellness visit. 10/04/22 @ 8:45 am by phone  Preventive Care 72 Years and Older, Male Preventive care refers to lifestyle choices and visits with your health care provider that can promote health and wellness. What does preventive care include? A yearly physical exam. This is also called an annual well check. Dental exams once or twice a year. Routine eye exams. Ask your health care provider how often you should have your eyes checked. Personal lifestyle choices, including: Daily care of your teeth and gums. Regular physical activity. Eating a healthy diet. Avoiding tobacco and drug use. Limiting alcohol use. Practicing safe sex. Taking low doses of aspirin every day. Taking vitamin and mineral supplements as recommended by your health care provider. What happens during an annual well check? The services and screenings done by your health care provider during your annual well check will depend on your age, overall health, lifestyle risk factors, and family history of disease. Counseling  Your health care provider may ask you questions about your: Alcohol use. Tobacco use. Drug use. Emotional well-being. Home and  relationship well-being. Sexual activity. Eating habits. History of falls. Memory and ability to understand (cognition). Work and work Statistician. Screening  You may have the following tests or measurements: Height, weight, and BMI. Blood pressure. Lipid and cholesterol levels. These may be checked every 5 years, or more frequently if you are over 16 years old. Skin check. Lung cancer screening. You may have this screening every year starting at age 48 if you have a 30-pack-year history of smoking and currently smoke or have quit within the past 15 years. Fecal occult blood test (FOBT) of the stool. You may have this test every year starting at age 79. Flexible sigmoidoscopy or colonoscopy. You may have a sigmoidoscopy every 5 years or a colonoscopy every 10 years starting at age 92. Prostate cancer screening. Recommendations will vary depending on your family history and other risks. Hepatitis C blood test. Hepatitis B blood test. Sexually transmitted disease (STD) testing. Diabetes screening. This is done by checking your blood sugar (glucose) after you have not eaten for a while (fasting). You may have this done every 1-3 years. Abdominal aortic aneurysm (AAA) screening. You may need this if you are a current or former smoker. Osteoporosis. You may be screened starting at age 70 if you are at high risk. Talk with your health care provider about your test results, treatment options, and if necessary, the need for more tests. Vaccines  Your health care provider may recommend certain vaccines, such as: Influenza vaccine. This is recommended every year. Tetanus, diphtheria, and acellular pertussis (Tdap, Td) vaccine. You may need a Td booster every 10 years. Zoster vaccine. You may need this after age 67. Pneumococcal 13-valent conjugate (PCV13) vaccine. One dose is recommended after age 46. Pneumococcal polysaccharide (PPSV23) vaccine. One dose is recommended  after age 32. Talk to your  health care provider about which screenings and vaccines you need and how often you need them. This information is not intended to replace advice given to you by your health care provider. Make sure you discuss any questions you have with your health care provider. Document Released: 03/07/2015 Document Revised: 10/29/2015 Document Reviewed: 12/10/2014 Elsevier Interactive Patient Education  2017 [place] Prevention in the Home Falls can cause injuries. They can happen to people of all ages. There are many things you can do to make your home safe and to help prevent falls. What can I do on the outside of my home? Regularly fix the edges of walkways and driveways and fix any cracks. Remove anything that might make you trip as you walk through a door, such as a raised step or threshold. Trim any bushes or trees on the path to your home. Use bright outdoor lighting. Clear any walking paths of anything that might make someone trip, such as rocks or tools. Regularly check to see if handrails are loose or broken. Make sure that both sides of any steps have handrails. Any raised decks and porches should have guardrails on the edges. Have any leaves, snow, or ice cleared regularly. Use sand or salt on walking paths during winter. Clean up any spills in your garage right away. This includes oil or grease spills. What can I do in the bathroom? Use night lights. Install grab bars by the toilet and in the tub and shower. Do not use towel bars as grab bars. Use non-skid mats or decals in the tub or shower. If you need to sit down in the shower, use a plastic, non-slip stool. Keep the floor dry. Clean up any water that spills on the floor as soon as it happens. Remove soap buildup in the tub or shower regularly. Attach bath mats securely with double-sided non-slip rug tape. Do not have throw rugs and other things on the floor that can make you trip. What can I do in the bedroom? Use night  lights. Make sure that you have a light by your bed that is easy to reach. Do not use any sheets or blankets that are too big for your bed. They should not hang down onto the floor. Have a firm chair that has side arms. You can use this for support while you get dressed. Do not have throw rugs and other things on the floor that can make you trip. What can I do in the kitchen? Clean up any spills right away. Avoid walking on wet floors. Keep items that you use a lot in easy-to-reach places. If you need to reach something above you, use a strong step stool that has a grab bar. Keep electrical cords out of the way. Do not use floor polish or wax that makes floors slippery. If you must use wax, use non-skid floor wax. Do not have throw rugs and other things on the floor that can make you trip. What can I do with my stairs? Do not leave any items on the stairs. Make sure that there are handrails on both sides of the stairs and use them. Fix handrails that are broken or loose. Make sure that handrails are as long as the stairways. Check any carpeting to make sure that it is firmly attached to the stairs. Fix any carpet that is loose or worn. Avoid having throw rugs at the top or bottom of the stairs. If you  do have throw rugs, attach them to the floor with carpet tape. Make sure that you have a light switch at the top of the stairs and the bottom of the stairs. If you do not have them, ask someone to add them for you. What else can I do to help prevent falls? Wear shoes that: Do not have high heels. Have rubber bottoms. Are comfortable and fit you well. Are closed at the toe. Do not wear sandals. If you use a stepladder: Make sure that it is fully opened. Do not climb a closed stepladder. Make sure that both sides of the stepladder are locked into place. Ask someone to hold it for you, if possible. Clearly mark and make sure that you can see: Any grab bars or handrails. First and last  steps. Where the edge of each step is. Use tools that help you move around (mobility aids) if they are needed. These include: Canes. Walkers. Scooters. Crutches. Turn on the lights when you go into a dark area. Replace any light bulbs as soon as they burn out. Set up your furniture so you have a clear path. Avoid moving your furniture around. If any of your floors are uneven, fix them. If there are any pets around you, be aware of where they are. Review your medicines with your doctor. Some medicines can make you feel dizzy. This can increase your chance of falling. Ask your doctor what other things that you can do to help prevent falls. This information is not intended to replace advice given to you by your health care provider. Make sure you discuss any questions you have with your health care provider. Document Released: 12/05/2008 Document Revised: 07/17/2015 Document Reviewed: 03/15/2014 Elsevier Interactive Patient Education  2017 [employer].

## 2021-10-06 ENCOUNTER — Other Ambulatory Visit (INDEPENDENT_AMBULATORY_CARE_PROVIDER_SITE_OTHER): Payer: Medicare Other

## 2021-10-06 DIAGNOSIS — E118 Type 2 diabetes mellitus with unspecified complications: Secondary | ICD-10-CM

## 2021-10-06 DIAGNOSIS — E785 Hyperlipidemia, unspecified: Secondary | ICD-10-CM | POA: Diagnosis not present

## 2021-10-06 DIAGNOSIS — E1169 Type 2 diabetes mellitus with other specified complication: Secondary | ICD-10-CM | POA: Diagnosis not present

## 2021-10-06 DIAGNOSIS — Z125 Encounter for screening for malignant neoplasm of prostate: Secondary | ICD-10-CM | POA: Diagnosis not present

## 2021-10-06 DIAGNOSIS — D751 Secondary polycythemia: Secondary | ICD-10-CM

## 2021-10-06 LAB — COMPREHENSIVE METABOLIC PANEL
ALT: 21 U/L (ref 0–53)
AST: 14 U/L (ref 0–37)
Albumin: 4.4 g/dL (ref 3.5–5.2)
Alkaline Phosphatase: 68 U/L (ref 39–117)
BUN: 22 mg/dL (ref 6–23)
CO2: 25 mEq/L (ref 19–32)
Calcium: 9.5 mg/dL (ref 8.4–10.5)
Chloride: 103 mEq/L (ref 96–112)
Creatinine, Ser: 0.97 mg/dL (ref 0.40–1.50)
GFR: 78.31 mL/min (ref >60.00)
Glucose, Bld: 139 mg/dL — ABNORMAL HIGH (ref 70–99)
Potassium: 4.3 mEq/L (ref 3.5–5.1)
Sodium: 140 mEq/L (ref 135–145)
Total Bilirubin: 0.7 mg/dL (ref 0.2–1.2)
Total Protein: 7 g/dL (ref 6.0–8.3)

## 2021-10-06 LAB — CBC WITH DIFFERENTIAL/PLATELET
Basophils Absolute: 0 10*3/uL (ref 0.0–0.1)
Basophils Relative: 0.6 % (ref 0.0–3.0)
Eosinophils Absolute: 0.2 10*3/uL (ref 0.0–0.7)
Eosinophils Relative: 3.8 % (ref 0.0–5.0)
HCT: 47.3 % (ref 39.0–52.0)
Hemoglobin: 16.3 g/dL (ref 13.0–17.0)
Lymphocytes Relative: 28.9 % (ref 12.0–46.0)
Lymphs Abs: 1.7 10*3/uL (ref 0.7–4.0)
MCHC: 34.4 g/dL (ref 30.0–36.0)
MCV: 94.3 fl (ref 78.0–100.0)
Monocytes Absolute: 0.5 10*3/uL (ref 0.1–1.0)
Monocytes Relative: 8.6 % (ref 3.0–12.0)
Neutro Abs: 3.4 10*3/uL (ref 1.4–7.7)
Neutrophils Relative %: 58.1 % (ref 43.0–77.0)
Platelets: 230 10*3/uL (ref 150.0–400.0)
RBC: 5.02 Mil/uL (ref 4.22–5.81)
RDW: 13 % (ref 11.5–15.5)
WBC: 5.9 10*3/uL (ref 4.0–10.5)

## 2021-10-06 LAB — LIPID PANEL
Cholesterol: 104 mg/dL (ref 0–200)
HDL: 49.4 mg/dL (ref >39.00)
LDL Cholesterol: 38 mg/dL (ref 0–99)
NonHDL: 54.59
Total CHOL/HDL Ratio: 2
Triglycerides: 81 mg/dL (ref 0.0–149.0)
VLDL: 16.2 mg/dL (ref 0.0–40.0)

## 2021-10-06 LAB — HEMOGLOBIN A1C: Hgb A1c MFr Bld: 6.4 % (ref 4.6–6.5)

## 2021-10-06 LAB — MICROALBUMIN / CREATININE URINE RATIO
Creatinine,U: 211.9 mg/dL
Microalb Creat Ratio: 5 mg/g (ref 0.0–30.0)
Microalb, Ur: 10.5 mg/dL — ABNORMAL HIGH (ref 0.0–1.9)

## 2021-10-06 LAB — PSA: PSA: 2.32 ng/mL (ref 0.10–4.00)

## 2021-10-12 ENCOUNTER — Ambulatory Visit (INDEPENDENT_AMBULATORY_CARE_PROVIDER_SITE_OTHER): Payer: Medicare Other | Admitting: Family Medicine

## 2021-10-12 ENCOUNTER — Encounter: Payer: Self-pay | Admitting: Family Medicine

## 2021-10-12 VITALS — BP 138/78 | HR 91 | Temp 97.8°F | Ht 73.5 in | Wt 320.2 lb

## 2021-10-12 DIAGNOSIS — Z0000 Encounter for general adult medical examination without abnormal findings: Secondary | ICD-10-CM | POA: Diagnosis not present

## 2021-10-12 DIAGNOSIS — I2510 Atherosclerotic heart disease of native coronary artery without angina pectoris: Secondary | ICD-10-CM | POA: Diagnosis not present

## 2021-10-12 DIAGNOSIS — M170 Bilateral primary osteoarthritis of knee: Secondary | ICD-10-CM

## 2021-10-12 DIAGNOSIS — I77810 Thoracic aortic ectasia: Secondary | ICD-10-CM | POA: Diagnosis not present

## 2021-10-12 DIAGNOSIS — D751 Secondary polycythemia: Secondary | ICD-10-CM | POA: Diagnosis not present

## 2021-10-12 DIAGNOSIS — G4733 Obstructive sleep apnea (adult) (pediatric): Secondary | ICD-10-CM | POA: Diagnosis not present

## 2021-10-12 DIAGNOSIS — Q438 Other specified congenital malformations of intestine: Secondary | ICD-10-CM

## 2021-10-12 DIAGNOSIS — Z86010 Personal history of colonic polyps: Secondary | ICD-10-CM

## 2021-10-12 DIAGNOSIS — I2584 Coronary atherosclerosis due to calcified coronary lesion: Secondary | ICD-10-CM

## 2021-10-12 DIAGNOSIS — E118 Type 2 diabetes mellitus with unspecified complications: Secondary | ICD-10-CM | POA: Diagnosis not present

## 2021-10-12 DIAGNOSIS — E1169 Type 2 diabetes mellitus with other specified complication: Secondary | ICD-10-CM

## 2021-10-12 DIAGNOSIS — Z8249 Family history of ischemic heart disease and other diseases of the circulatory system: Secondary | ICD-10-CM

## 2021-10-12 DIAGNOSIS — I10 Essential (primary) hypertension: Secondary | ICD-10-CM

## 2021-10-12 DIAGNOSIS — D126 Benign neoplasm of colon, unspecified: Secondary | ICD-10-CM

## 2021-10-12 DIAGNOSIS — E785 Hyperlipidemia, unspecified: Secondary | ICD-10-CM

## 2021-10-12 MED ORDER — ATORVASTATIN CALCIUM 20 MG PO TABS: 20.0000 mg | ORAL_TABLET | Freq: Every day | ORAL | 3 refills | Status: DC

## 2021-10-12 MED ORDER — LOSARTAN POTASSIUM 100 MG PO TABS: 100.0000 mg | ORAL_TABLET | Freq: Every day | ORAL | 3 refills | Status: DC

## 2021-10-12 MED ORDER — TRULICITY 0.75 MG/0.5ML ~~LOC~~ SOAJ: 0.7500 mg | SUBCUTANEOUS | 11 refills | Status: DC

## 2021-10-12 MED ORDER — FREESTYLE LIBRE 3 SENSOR MISC
1.0000 [IU] | 3 refills | Status: DC
Start: 2021-10-12 — End: 2022-10-15

## 2021-10-12 MED ORDER — METFORMIN HCL 500 MG PO TABS
500.0000 mg | ORAL_TABLET | Freq: Every day | ORAL | 3 refills | Status: DC
Start: 2021-10-12 — End: 2022-07-26

## 2021-10-12 NOTE — Assessment & Plan Note (Signed)
Preventative protocols reviewed and updated unless pt declined. Discussed healthy diet and lifestyle.  

## 2021-10-12 NOTE — Progress Notes (Signed)
Patient ID: Dennis Ayers, male    DOB: 1950-06-04, 71 y.o.   MRN: [id number]  This visit was conducted in person.  BP 138/78   Pulse 91   Temp 97.8 F (36.6 C) (Temporal)   Ht 6' 1.5" (1.867 m)   Wt (!) 320 lb 4 oz (145.3 kg)   SpO2 95%   BMI 41.68 kg/m    CC: CPE Subjective:   HPI: Dennis Ayers is a 71 y.o. male presenting on 10/12/2021 for Annual Exam [facility] prt 2. Wants to discuss metformin. )   Saw health advisor earlier this month for medicare wellness visit. Note reviewed.   No results found.  Flowsheet Row Clinical Support from 10/01/2021 in [place] at [place]  PHQ-2 Total Score 0          10/01/2021    8:17 AM 09/28/2021   11:23 PM 09/23/2020    1:19 PM 10/02/2019   10:00 AM 09/25/2018    9:12 AM  Fall Risk   Falls in the past year? 0 0 0 0 1  Number falls in past yr: 0  0 0   Injury with Fall? 0 0 0 0 0  Comment     lost balance  Risk for fall due to : No Fall Risks  Medication side effect Medication side effect History of fall(s);Medication side effect  Follow up Falls evaluation completed  Falls evaluation completed;Falls prevention discussed Falls evaluation completed;Falls prevention discussed Falls evaluation completed;Falls prevention discussed   Last visit started using Freestyle Libre 3, we also started trulicity - continues 0.'75mg'$  weekly. 90d average 142. Time in range: 99% Lab Results  Component Value Date   HGBA1C 6.4 10/06/2021    Preventative: COLONOSCOPY Date: 06/2015 mult TAs, severe diverticulosis, 1 large 2.5cm cecal polyp pending resection Carlean Purl) - 08/2015 large TA, rpt 1 yr Carlean Purl) COLONOSCOPY WITH PROPOFOL 10/29/2016 TA, rpt 1 yr Carlean Purl, Ofilia Neas, MD) COLONOSCOPY 11/2017 - 7 TAs, 3 SSPs, diverticulosis, rpt 2 yrs Carlean Purl) Colonoscopy 12/2019 - multiple TA and SSP, diverticulosis with excess colon looping, rpt 1 year ([facility])  Colonoscopy 01/2021 - diverticulosis, incomplete, suspect polyposis syndrome, rpt 1 yr  in hospital Carlean Purl).  Prostate cancer screening - discussed. Continue screening.  Lung cancer screening - never smoker  AAA screen - never smoker, no fmhx AAA  Flu shot - yearly at [place] 03/2019, 04/2019, booster 10/2019, 05/2020, bivalent 11/2020, 06/2021  Td 2004, Tdap 06/2015  RSV - discussed Prevnar-13 06/2015, pneumovax 10/2015. Prevnar-20 07/2020 Zostavax - 2013 Shingrix - 10/2017, 02/2018 Hep B - completed  Advanced directive discussion - has at home. Copy in chart 08/2015. HCPOA is wife and son. No prolonged life support if terminal illness.  Seat belt use discussed  Sunscreen use discussed, no changing moles on skin.  Non smoker  Alcohol - none Dentist - Q6 mo Eye exam - yearly  Bowel - no constipation Bladder - no incontinence    Caffeine: 2-3 diet sodas/day Lives with wife 1 grown son. Occupation: Self-employed; Psychologist, forensic Activity: some golf, limited by knee pain  Diet: good water, daily vegetables      Relevant past medical, surgical, family and social history reviewed and updated as indicated. Interim medical history since our last visit reviewed. Allergies and medications reviewed and updated. Outpatient Medications Prior to Visit  Medication Sig Dispense Refill   acetaminophen (TYLENOL) 500 MG tablet Take 1,000 mg by mouth every 6 (six) hours as  needed for moderate pain or mild pain.     aspirin EC 81 MG tablet Take 81 mg by mouth daily.     doxylamine, Sleep, (UNISOM) 25 MG tablet Take 50 mg by mouth at bedtime.     glucose blood (ONETOUCH ULTRA) test strip Check blood sugar once a day 100 strip 3   Krill Oil 300 MG CAPS Take 300 mg by mouth daily.     Misc Natural Products (OSTEO BI-FLEX JOINT SHIELD PO) Take 1 capsule by mouth daily.      Multiple Vitamin (MULTIVITAMIN) tablet Take 1 tablet by mouth daily.     OneTouch Delica Lancets 39Q MISC Use as directed to check blood sugar once daily as needed.  Dx code: E11.9  100 each 3   atorvastatin (LIPITOR) 20 MG tablet TAKE 1 TABLET BY MOUTH DAILY AT  6 PM. 90 tablet 0   Continuous Blood Gluc Sensor (FREESTYLE LIBRE 3 SENSOR) MISC 1 Units by Does not apply route as directed. E11.8. Place 1 sensor on the skin every 14 days. Use to check glucose continuously 6 each 3   Dulaglutide (TRULICITY) 3.00 PQ/3.3AQ SOPN Inject 0.75 mg into the skin once a week. 2 mL 6   losartan (COZAAR) 100 MG tablet Take 1 tablet (100 mg total) by mouth daily. 90 tablet 3   metFORMIN (GLUCOPHAGE) 500 MG tablet Take 500 mg by mouth daily with breakfast.     losartan (COZAAR) 50 MG tablet Take 50 mg by mouth daily.     No facility-administered medications prior to visit.     Per HPI unless specifically indicated in ROS section below Review of Systems  Constitutional:  Negative for activity change, appetite change, chills, fatigue, fever and unexpected weight change.  HENT:  Negative for hearing loss.   Eyes:  Negative for visual disturbance.  Respiratory:  Negative for cough, chest tightness, shortness of breath and wheezing.   Cardiovascular:  Negative for chest pain, palpitations and leg swelling.  Gastrointestinal:  Negative for abdominal distention, abdominal pain, blood in stool, constipation, diarrhea, nausea and vomiting.  Genitourinary:  Negative for difficulty urinating and hematuria.  Musculoskeletal:  Negative for arthralgias, myalgias and neck pain.  Skin:  Negative for rash.  Neurological:  Negative for dizziness, seizures, syncope and headaches.  Hematological:  Negative for adenopathy. Does not bruise/bleed easily.  Psychiatric/Behavioral:  Negative for dysphoric mood. The patient is not nervous/anxious.     Objective:  BP 138/78   Pulse 91   Temp 97.8 F (36.6 C) (Temporal)   Ht 6' 1.5" (1.867 m)   Wt (!) 320 lb 4 oz (145.3 kg)   SpO2 95%   BMI 41.68 kg/m   Wt Readings from Last 3 Encounters:  10/12/21 (!) 320 lb 4 oz (145.3 kg)  10/01/21 (!) 328 lb (148.8  kg)  08/28/21 (!) 328 lb (148.8 kg)      Physical Exam Vitals and nursing note reviewed.  Constitutional:      General: He is not in acute distress.    Appearance: Normal appearance. He is well-developed. He is obese. He is not ill-appearing.  HENT:     Head: Normocephalic and atraumatic.     Right Ear: Hearing, tympanic membrane, ear canal and external ear normal.     Left Ear: Hearing, tympanic membrane, ear canal and external ear normal.  Eyes:     General: No scleral icterus.    Extraocular Movements: Extraocular movements intact.     Conjunctiva/sclera: Conjunctivae normal.  Pupils: Pupils are equal, round, and reactive to light.  Neck:     Thyroid: No thyroid mass or thyromegaly.  Cardiovascular:     Rate and Rhythm: Normal rate and regular rhythm.     Pulses: Normal pulses.          Radial pulses are 2+ on the right side and 2+ on the left side.     Heart sounds: Normal heart sounds. No murmur heard. Pulmonary:     Effort: Pulmonary effort is normal. No respiratory distress.     Breath sounds: Normal breath sounds. No wheezing, rhonchi or rales.  Abdominal:     General: Bowel sounds are normal. There is no distension.     Palpations: Abdomen is soft. There is no mass.     Tenderness: There is no abdominal tenderness. There is no guarding or rebound.     Hernia: No hernia is present.  Musculoskeletal:        General: Normal range of motion.     Cervical back: Normal range of motion and neck supple.     Right lower leg: No edema.     Left lower leg: No edema.  Lymphadenopathy:     Cervical: No cervical adenopathy.  Skin:    General: Skin is warm and dry.     Findings: No rash.  Neurological:     General: No focal deficit present.     Mental Status: He is alert and oriented to person, place, and time.  Psychiatric:        Mood and Affect: Mood normal.        Behavior: Behavior normal.        Thought Content: Thought content normal.        Judgment: Judgment  normal.       Results for orders placed or performed in visit on 10/06/21  CBC with Differential/Platelet  Result Value Ref Range   WBC 5.9 4.0 - 10.5 K/uL   RBC 5.02 4.22 - 5.81 Mil/uL   Hemoglobin 16.3 13.0 - 17.0 g/dL   HCT 47.3 39.0 - 52.0 %   MCV 94.3 78.0 - 100.0 fl   MCHC 34.4 30.0 - 36.0 g/dL   RDW 13.0 11.5 - 15.5 %   Platelets 230.0 150.0 - 400.0 K/uL   Neutrophils Relative % 58.1 43.0 - 77.0 %   Lymphocytes Relative 28.9 12.0 - 46.0 %   Monocytes Relative 8.6 3.0 - 12.0 %   Eosinophils Relative 3.8 0.0 - 5.0 %   Basophils Relative 0.6 0.0 - 3.0 %   Neutro Abs 3.4 1.4 - 7.7 K/uL   Lymphs Abs 1.7 0.7 - 4.0 K/uL   Monocytes Absolute 0.5 0.1 - 1.0 K/uL   Eosinophils Absolute 0.2 0.0 - 0.7 K/uL   Basophils Absolute 0.0 0.0 - 0.1 K/uL  PSA  Result Value Ref Range   PSA 2.32 0.10 - 4.00 ng/mL  Microalbumin / creatinine urine ratio  Result Value Ref Range   Microalb, Ur 10.5 (H) 0.0 - 1.9 mg/dL   Creatinine,U 211.9 mg/dL   Microalb Creat Ratio 5.0 0.0 - 30.0 mg/g  Hemoglobin A1c  Result Value Ref Range   Hgb A1c MFr Bld 6.4 4.6 - 6.5 %  Comprehensive metabolic panel  Result Value Ref Range   Sodium 140 135 - 145 mEq/L   Potassium 4.3 3.5 - 5.1 mEq/L   Chloride 103 96 - 112 mEq/L   CO2 25 19 - 32 mEq/L   Glucose, Bld 139 (H)  70 - 99 mg/dL   BUN 22 6 - 23 mg/dL   Creatinine, Ser 0.97 0.40 - 1.50 mg/dL   Total Bilirubin 0.7 0.2 - 1.2 mg/dL   Alkaline Phosphatase 68 39 - 117 U/L   AST 14 0 - 37 U/L   ALT 21 0 - 53 U/L   Total Protein 7.0 6.0 - 8.3 g/dL   Albumin 4.4 3.5 - 5.2 g/dL   GFR 78.31 >60.00 mL/min   Calcium 9.5 8.4 - 10.5 mg/dL  Lipid panel  Result Value Ref Range   Cholesterol 104 0 - 200 mg/dL   Triglycerides 81.0 0.0 - 149.0 mg/dL   HDL 49.40 >39.00 mg/dL   VLDL 16.2 0.0 - 40.0 mg/dL   LDL Cholesterol 38 0 - 99 mg/dL   Total CHOL/HDL Ratio 2    NonHDL 54.59     Assessment & Plan:   Problem List Items Addressed This Visit     Health  maintenance examination - Primary (Chronic)    Preventative protocols reviewed and updated unless pt declined. Discussed healthy diet and lifestyle.       Severe obesity (BMI >= 40) (HCC)    Congratulated on ongoing weight loss noted with trulicity. Continue to encourage regular exercise - he has not been able to do this due to summer heat.       Relevant Medications   Dulaglutide (TRULICITY) 6.56 CL/2.7NT SOPN   metFORMIN (GLUCOPHAGE) 500 MG tablet   Knee osteoarthritis    Pending appointment with Dr. Mardelle Matte orthopedics.      Personal history of colonic polyps-serrated adenomas and tubular adenoma - ATTENUATED POLYPOSIS SUSPECTED    Appreciate GI care of patient.  Upcoming colonoscopy to be scheduled in December of this year.      OSA (obstructive sleep apnea)    We will need to review this at next visit.      Polycythemia    Latest CBC normal.      Family history of coronary artery disease    Sees Dr. Saunders Revel.  Appreciate cardiology care.      Essential hypertension    Chronic, stable on losartan 100 mg daily-continue this.      Relevant Medications   atorvastatin (LIPITOR) 20 MG tablet   losartan (COZAAR) 100 MG tablet   Controlled diabetes mellitus type 2 with complications (HCC)    Chronic, stable on current regimen of low-dose metformin and low-dose Trulicity. He also regularly uses freestyle libre 3 to closely monitor sugars. He will return in 6 months for diabetes follow-up visit.      Relevant Medications   atorvastatin (LIPITOR) 20 MG tablet   Dulaglutide (TRULICITY) 7.00 FV/4.9SW SOPN   losartan (COZAAR) 100 MG tablet   metFORMIN (GLUCOPHAGE) 500 MG tablet   Coronary artery calcification   Relevant Medications   atorvastatin (LIPITOR) 20 MG tablet   losartan (COZAAR) 100 MG tablet   Dilated aortic root (HCC)   Relevant Medications   atorvastatin (LIPITOR) 20 MG tablet   losartan (COZAAR) 100 MG tablet   Redundant colon   Hyperlipidemia associated with  type 2 diabetes mellitus (HCC) - goal LDL <70    Chronic, well controlled on current atorvastatin 20 mg daily-continue. The ASCVD Risk score (Arnett DK, et al., 2019) failed to calculate for the following reasons:   The valid total cholesterol range is 130 to 320 mg/dL      Relevant Medications   atorvastatin (LIPITOR) 20 MG tablet   Dulaglutide (TRULICITY) 9.67 RF/1.6BW SOPN  losartan (COZAAR) 100 MG tablet   metFORMIN (GLUCOPHAGE) 500 MG tablet   Polyposis coli     Meds ordered this encounter  Medications   atorvastatin (LIPITOR) 20 MG tablet    Sig: Take 1 tablet (20 mg total) by mouth daily.    Dispense:  90 tablet    Refill:  3    Please send a replace/new response with 100-Day Supply if appropriate to maximize member benefit. Requesting 1 year supply.   Dulaglutide (TRULICITY) 1.96 QI/2.9NL SOPN    Sig: Inject 0.75 mg into the skin once a week.    Dispense:  2 mL    Refill:  11   losartan (COZAAR) 100 MG tablet    Sig: Take 1 tablet (100 mg total) by mouth daily.    Dispense:  90 tablet    Refill:  3   metFORMIN (GLUCOPHAGE) 500 MG tablet    Sig: Take 1 tablet (500 mg total) by mouth daily with breakfast.    Dispense:  90 tablet    Refill:  3   Continuous Blood Gluc Sensor (FREESTYLE LIBRE 3 SENSOR) MISC    Sig: 1 Units by Does not apply route as directed. E11.8. Place 1 sensor on the skin every 14 days. Use to check glucose continuously    Dispense:  6 each    Refill:  3   No orders of the defined types were placed in this encounter.   Patient instructions: Good to see you today You are doing well today Continue current medicines. Return as needed or in 6 months for diabetes follow up visit.  Follow up plan: Return in about 6 months (around 04/14/2022) for follow up visit.  Ria Bush, MD

## 2021-10-12 NOTE — Assessment & Plan Note (Signed)
Congratulated on ongoing weight loss noted with trulicity. Continue to encourage regular exercise - he has not been able to do this due to summer heat.

## 2021-10-12 NOTE — Assessment & Plan Note (Signed)
We will need to review this at next visit.

## 2021-10-12 NOTE — Assessment & Plan Note (Signed)
Pending appointment with Dr. Mardelle Matte orthopedics.

## 2021-10-12 NOTE — Assessment & Plan Note (Signed)
Sees Dr. Saunders Revel.  Appreciate cardiology care.

## 2021-10-12 NOTE — Patient Instructions (Signed)
Good to see you today You are doing well today Continue current medicines. Return as needed or in 6 months for diabetes follow up visit.  Health Maintenance After Age 72 After age 39, you are at a higher risk for certain long-term diseases and infections as well as injuries from falls. Falls are a major cause of broken bones and head injuries in people who are older than age 58. Getting regular preventive care can help to keep you healthy and well. Preventive care includes getting regular testing and making lifestyle changes as recommended by your health care provider. Talk with your health care provider about: Which screenings and tests you should have. A screening is a test that checks for a disease when you have no symptoms. A diet and exercise plan that is right for you. What should I know about screenings and tests to prevent falls? Screening and testing are the best ways to find a health problem early. Early diagnosis and treatment give you the best chance of managing medical conditions that are common after age 80. Certain conditions and lifestyle choices may make you more likely to have a fall. Your health care provider may recommend: Regular vision checks. Poor vision and conditions such as cataracts can make you more likely to have a fall. If you wear glasses, make sure to get your prescription updated if your vision changes. Medicine review. Work with your health care provider to regularly review all of the medicines you are taking, including over-the-counter medicines. Ask your health care provider about any side effects that may make you more likely to have a fall. Tell your health care provider if any medicines that you take make you feel dizzy or sleepy. Strength and balance checks. Your health care provider may recommend certain tests to check your strength and balance while standing, walking, or changing positions. Foot health exam. Foot pain and numbness, as well as not wearing proper  footwear, can make you more likely to have a fall. Screenings, including: Osteoporosis screening. Osteoporosis is a condition that causes the bones to get weaker and break more easily. Blood pressure screening. Blood pressure changes and medicines to control blood pressure can make you feel dizzy. Depression screening. You may be more likely to have a fall if you have a fear of falling, feel depressed, or feel unable to do activities that you used to do. Alcohol use screening. Using too much alcohol can affect your balance and may make you more likely to have a fall. Follow these instructions at home: Lifestyle Do not drink alcohol if: Your health care provider tells you not to drink. If you drink alcohol: Limit how much you have to: 0-1 drink a day for women. 0-2 drinks a day for men. Know how much alcohol is in your drink. In the [place] one drink equals one 12 oz bottle of beer (355 mL), one 5 oz glass of wine (148 mL), or one 1 oz glass of hard liquor (44 mL). Do not use any products that contain nicotine or tobacco. These products include cigarettes, chewing tobacco, and vaping devices, such as e-cigarettes. If you need help quitting, ask your health care provider. Activity  Follow a regular exercise program to stay fit. This will help you maintain your balance. Ask your health care provider what types of exercise are appropriate for you. If you need a cane or walker, use it as recommended by your health care provider. Wear supportive shoes that have nonskid soles. Safety  Remove any  tripping hazards, such as rugs, cords, and clutter. Install safety equipment such as grab bars in bathrooms and safety rails on stairs. Keep rooms and walkways well-lit. General instructions Talk with your health care provider about your risks for falling. Tell your health care provider if: You fall. Be sure to tell your health care provider about all falls, even ones that seem minor. You feel dizzy,  tiredness (fatigue), or off-balance. Take over-the-counter and prescription medicines only as told by your health care provider. These include supplements. Eat a healthy diet and maintain a healthy weight. A healthy diet includes low-fat dairy products, low-fat (lean) meats, and fiber from whole grains, beans, and lots of fruits and vegetables. Stay current with your vaccines. Schedule regular health, dental, and eye exams. Summary Having a healthy lifestyle and getting preventive care can help to protect your health and wellness after age 45. Screening and testing are the best way to find a health problem early and help you avoid having a fall. Early diagnosis and treatment give you the best chance for managing medical conditions that are more common for people who are older than age 74. Falls are a major cause of broken bones and head injuries in people who are older than age 69. Take precautions to prevent a fall at home. Work with your health care provider to learn what changes you can make to improve your health and wellness and to prevent falls. This information is not intended to replace advice given to you by your health care provider. Make sure you discuss any questions you have with your health care provider. Document Revised: 06/30/2020 Document Reviewed: 06/30/2020 Elsevier Patient Education  [place].

## 2021-10-12 NOTE — Assessment & Plan Note (Signed)
Chronic, well controlled on current atorvastatin 20 mg daily-continue. The ASCVD Risk score (Arnett DK, et al., 2019) failed to calculate for the following reasons:   The valid total cholesterol range is 130 to 320 mg/dL

## 2021-10-12 NOTE — Assessment & Plan Note (Signed)
Chronic, stable on current regimen of low-dose metformin and low-dose Trulicity. He also regularly uses freestyle libre 3 to closely monitor sugars. He will return in 6 months for diabetes follow-up visit.

## 2021-10-12 NOTE — Assessment & Plan Note (Signed)
Appreciate GI care of patient.  Upcoming colonoscopy to be scheduled in December of this year.

## 2021-10-12 NOTE — Assessment & Plan Note (Signed)
Latest CBC normal.

## 2021-10-12 NOTE — Assessment & Plan Note (Signed)
Chronic, stable on losartan 100 mg daily-continue this.

## 2021-11-09 ENCOUNTER — Encounter: Payer: Self-pay | Admitting: Family Medicine

## 2021-11-18 ENCOUNTER — Telehealth: Payer: Self-pay | Admitting: Family Medicine

## 2021-11-18 NOTE — Telephone Encounter (Signed)
Patient called and stated he received a call about missing his A1C recheck and he didn't miss it. Call back number [phone].

## 2021-11-18 NOTE — Telephone Encounter (Signed)
Rtn pt's call notifying him he just had A1c checked last month and has DM f/u scheduled 03/2022.  Pt agreed and states that's why he was confused about the call.  Suggested pt disregard the message and informed him we do not leave automated messages stating a pt's health conditions.

## 2021-11-25 ENCOUNTER — Ambulatory Visit: Payer: Medicare Other | Admitting: Internal Medicine

## 2021-12-15 ENCOUNTER — Telehealth: Payer: Self-pay | Admitting: Internal Medicine

## 2021-12-17 ENCOUNTER — Telehealth: Payer: Self-pay

## 2021-12-17 NOTE — Telephone Encounter (Signed)
-----   Message from Dennis Ayers sent at 12/15/2021 11:22 AM EDT ----- Pt is calling to schedule his hospital colon, he said the first week of December is good for him.

## 2021-12-17 NOTE — Telephone Encounter (Signed)
Pt requesting to be scheduled for a colonoscopy in December: Please review procedure note on 01/26/2021 and telephone note on 02/05/2021: Please advise in regarding to scheduling and any other request at the time of procedure:

## 2021-12-18 ENCOUNTER — Other Ambulatory Visit: Payer: Self-pay

## 2021-12-18 DIAGNOSIS — Z860100 Personal history of colon polyps, unspecified: Secondary | ICD-10-CM

## 2021-12-18 DIAGNOSIS — D1391 Familial adenomatous polyposis: Secondary | ICD-10-CM

## 2021-12-18 NOTE — Telephone Encounter (Signed)
OK to schedule colonoscopy at [facility]  Dx polyposis coli   Please let [facility] know he will need an ex large abdominal binder

## 2021-12-18 NOTE — Telephone Encounter (Signed)
Pt wife Dennis Ayers  was made aware of Dr. Carlean Purl recommendations: Pt scheduled for a Colonoscopy on 01/26/2022 at 9:00 AM at [facility]: Dennis Ayers made aware: Case ID [id number] Pt scheduled for a previsit appointment on 01/01/2022 at 3:00 PM: Dennis Ayers made aware Dennis Ayers verbalized understanding with all questions answered.

## 2021-12-23 DIAGNOSIS — M1711 Unilateral primary osteoarthritis, right knee: Secondary | ICD-10-CM | POA: Diagnosis not present

## 2021-12-27 ENCOUNTER — Encounter: Payer: Self-pay | Admitting: Family Medicine

## 2021-12-28 NOTE — Telephone Encounter (Signed)
Updated pt's chart.  

## 2022-01-01 ENCOUNTER — Other Ambulatory Visit: Payer: Self-pay

## 2022-01-01 ENCOUNTER — Ambulatory Visit (AMBULATORY_SURGERY_CENTER): Payer: Self-pay

## 2022-01-01 VITALS — Ht 74.5 in | Wt 325.0 lb

## 2022-01-01 DIAGNOSIS — Z86010 Personal history of colonic polyps: Secondary | ICD-10-CM

## 2022-01-01 MED ORDER — NA SULFATE-K SULFATE-MG SULF 17.5-3.13-1.6 GM/177ML PO SOLN: 1.0000 | Freq: Once | ORAL | 0 refills | Status: AC

## 2022-01-01 NOTE — Progress Notes (Signed)
Denies allergies to eggs or soy products. Denies complication of anesthesia or sedation. Denies use of weight loss medication. Denies use of O2.   Emmi instructions given for colonoscopy.  

## 2022-01-19 ENCOUNTER — Encounter (HOSPITAL_COMMUNITY): Payer: Self-pay | Admitting: Internal Medicine

## 2022-01-21 ENCOUNTER — Encounter: Payer: Self-pay | Admitting: Internal Medicine

## 2022-01-22 HISTORY — PX: COLONOSCOPY: SHX174

## 2022-01-26 ENCOUNTER — Encounter (HOSPITAL_COMMUNITY): Payer: Self-pay | Admitting: Internal Medicine

## 2022-01-26 ENCOUNTER — Ambulatory Visit (HOSPITAL_BASED_OUTPATIENT_CLINIC_OR_DEPARTMENT_OTHER): Payer: Medicare Other | Admitting: Certified Registered"

## 2022-01-26 ENCOUNTER — Ambulatory Visit (HOSPITAL_COMMUNITY)
Admission: RE | Admit: 2022-01-26 | Discharge: 2022-01-26 | Disposition: A | Discharge status: Home / Self Care | Payer: Medicare Other | Attending: Internal Medicine | Admitting: Internal Medicine

## 2022-01-26 ENCOUNTER — Encounter (HOSPITAL_COMMUNITY)
Admission: RE | Disposition: A | Discharge status: Home / Self Care | Payer: Self-pay | Source: Home / Self Care | Attending: Internal Medicine

## 2022-01-26 ENCOUNTER — Other Ambulatory Visit: Payer: Self-pay

## 2022-01-26 ENCOUNTER — Ambulatory Visit (HOSPITAL_COMMUNITY): Payer: Medicare Other | Admitting: Certified Registered"

## 2022-01-26 DIAGNOSIS — I10 Essential (primary) hypertension: Secondary | ICD-10-CM

## 2022-01-26 DIAGNOSIS — Z09 Encounter for follow-up examination after completed treatment for conditions other than malignant neoplasm: Secondary | ICD-10-CM | POA: Diagnosis not present

## 2022-01-26 DIAGNOSIS — Z6839 Body mass index (BMI) 39.0-39.9, adult: Secondary | ICD-10-CM | POA: Diagnosis not present

## 2022-01-26 DIAGNOSIS — Z86010 Personal history of colonic polyps: Secondary | ICD-10-CM | POA: Diagnosis not present

## 2022-01-26 DIAGNOSIS — E119 Type 2 diabetes mellitus without complications: Secondary | ICD-10-CM | POA: Insufficient documentation

## 2022-01-26 DIAGNOSIS — D120 Benign neoplasm of cecum: Secondary | ICD-10-CM

## 2022-01-26 DIAGNOSIS — K5730 Diverticulosis of large intestine without perforation or abscess without bleeding: Secondary | ICD-10-CM

## 2022-01-26 DIAGNOSIS — G4730 Sleep apnea, unspecified: Secondary | ICD-10-CM | POA: Diagnosis not present

## 2022-01-26 DIAGNOSIS — Z1211 Encounter for screening for malignant neoplasm of colon: Secondary | ICD-10-CM | POA: Diagnosis not present

## 2022-01-26 DIAGNOSIS — D1391 Familial adenomatous polyposis: Secondary | ICD-10-CM

## 2022-01-26 HISTORY — PX: COLONOSCOPY WITH PROPOFOL: SHX5780

## 2022-01-26 HISTORY — PX: POLYPECTOMY: SHX5525

## 2022-01-26 LAB — GLUCOSE, CAPILLARY: Glucose-Capillary: 120 mg/dL — ABNORMAL HIGH (ref 70–99)

## 2022-01-26 SURGERY — COLONOSCOPY WITH PROPOFOL
Anesthesia: Monitor Anesthesia Care

## 2022-01-26 MED ORDER — PROPOFOL 500 MG/50ML IV EMUL
INTRAVENOUS | Status: DC | PRN
  Administered 2022-01-26: 150 ug/kg/min via INTRAVENOUS

## 2022-01-26 MED ORDER — LACTATED RINGERS IV SOLN
INTRAVENOUS | Status: DC
  Administered 2022-01-26: 1000 mL via INTRAVENOUS

## 2022-01-26 MED ORDER — PROPOFOL 10 MG/ML IV BOLUS
INTRAVENOUS | Status: DC | PRN
  Administered 2022-01-26: 20 mg via INTRAVENOUS

## 2022-01-26 MED ORDER — PROPOFOL 1000 MG/100ML IV EMUL
INTRAVENOUS | Status: AC
  Filled 2022-01-26: qty 100

## 2022-01-26 MED ORDER — PROPOFOL 500 MG/50ML IV EMUL
INTRAVENOUS | Status: AC
  Filled 2022-01-26: qty 50

## 2022-01-26 MED ORDER — SODIUM CHLORIDE 0.9 % IV SOLN: INTRAVENOUS | Status: DC

## 2022-01-26 MED ORDER — LIDOCAINE 2% (20 MG/ML) 5 ML SYRINGE
INTRAMUSCULAR | Status: DC | PRN
  Administered 2022-01-26: 100 mg via INTRAVENOUS

## 2022-01-26 SURGICAL SUPPLY — 22 items

## 2022-01-26 NOTE — Anesthesia Procedure Notes (Signed)
Procedure Name: MAC Date/Time: 01/26/2022 9:08 AM  Performed by: Eben Burow, CRNAPre-anesthesia Checklist: Patient identified, Emergency Drugs available, Suction available, Patient being monitored and Timeout performed Oxygen Delivery Method: Simple face mask Placement Confirmation: positive ETCO2

## 2022-01-26 NOTE — Anesthesia Postprocedure Evaluation (Signed)
Anesthesia Post Note  Patient: Dennis Ayers  Procedure(s) Performed: COLONOSCOPY WITH PROPOFOL POLYPECTOMY     Patient location during evaluation: PACU Anesthesia Type: MAC Level of consciousness: awake and alert Pain management: pain level controlled Vital Signs Assessment: post-procedure vital signs reviewed and stable Respiratory status: spontaneous breathing, nonlabored ventilation, respiratory function stable and patient connected to nasal cannula oxygen Cardiovascular status: stable and blood pressure returned to baseline Postop Assessment: no apparent nausea or vomiting Anesthetic complications: no  No notable events documented.  Last Vitals:  Vitals:   01/26/22 0803 01/26/22 0942  BP: (!) 144/94 (!) 105/40  Pulse: 100 65  Resp: (!) 22 (!) 26  Temp: (!) 35.8 C 36.7 C  SpO2: 97% 97%    Last Pain:  Vitals:   01/26/22 0942  TempSrc:   PainSc: 0-No pain                 Maryum Batterson S

## 2022-01-26 NOTE — Transfer of Care (Signed)
Immediate Anesthesia Transfer of Care Note  Patient: PRABHAV FAULKENBERRY  Procedure(s) Performed: COLONOSCOPY WITH PROPOFOL POLYPECTOMY  Patient Location: PACU and Endoscopy Unit  Anesthesia Type:MAC  Level of Consciousness: awake, alert , and patient cooperative  Airway & Oxygen Therapy: Patient Spontanous Breathing and Patient connected to face mask oxygen  Post-op Assessment: Report given to RN and Post -op Vital signs reviewed and stable  Post vital signs: Reviewed and stable  Last Vitals:  Vitals Value Taken Time  BP 105/40 01/26/22 0942  Temp    Pulse 87 01/26/22 0943  Resp 27 01/26/22 0943  SpO2 97 % 01/26/22 0943  Vitals shown include unvalidated device data.  Last Pain:  Vitals:   01/26/22 0942  TempSrc:   PainSc: 0-No pain         Complications: No notable events documented.

## 2022-01-26 NOTE — Discharge Instructions (Signed)
I was able to complete the colonoscopy - only 1 tiny polyp. I removed that and will let you know my recommendations.  I appreciate the opportunity to care for you. Gatha Mayer, MD, FACG  YOU HAD AN ENDOSCOPIC PROCEDURE TODAY: Refer to the procedure report and other information in the discharge instructions given to you for any specific questions about what was found during the examination. If this information does not answer your questions, please call Dr. Celesta Aver office at [phone] to clarify.   YOU SHOULD EXPECT: Some feelings of bloating in the abdomen. Passage of more gas than usual. Walking can help get rid of the air that was put into your GI tract during the procedure and reduce the bloating. If you had a lower endoscopy (such as a colonoscopy or flexible sigmoidoscopy) you may notice spotting of blood in your stool or on the toilet paper. Some abdominal soreness may be present for a day or two, also.  DIET: Your first meal following the procedure should be a light meal and then it is ok to progress to your normal diet. A half-sandwich or bowl of soup is an example of a good first meal. Heavy or fried foods are harder to digest and may make you feel nauseous or bloated. Drink plenty of fluids but you should avoid alcoholic beverages for 24 hours.   ACTIVITY: Your care partner should take you home directly after the procedure. You should plan to take it easy, moving slowly for the rest of the day. You can resume normal activity the day after the procedure however YOU SHOULD NOT DRIVE, use power tools, machinery or perform tasks that involve climbing or major physical exertion for 24 hours (because of the sedation medicines used during the test).   SYMPTOMS TO REPORT IMMEDIATELY: A gastroenterologist can be reached at any hour. Please call [phone]  for any of the following symptoms:  Following lower endoscopy (colonoscopy, flexible sigmoidoscopy) Excessive amounts of blood in the  stool  Significant tenderness, worsening of abdominal pains  Swelling of the abdomen that is new, acute  Fever of 100 or higher

## 2022-01-26 NOTE — Anesthesia Preprocedure Evaluation (Signed)
Anesthesia Evaluation  Patient identified by MRN, date of birth, ID band Patient awake    Reviewed: Allergy & Precautions, H&P , NPO status , Patient's Chart, lab work & pertinent test results  Airway Mallampati: III  TM Distance: <3 FB Neck ROM: Full    Dental no notable dental hx.    Pulmonary sleep apnea    Pulmonary exam normal breath sounds clear to auscultation       Cardiovascular hypertension, Normal cardiovascular exam Rhythm:Regular Rate:Normal     Neuro/Psych negative neurological ROS  negative psych ROS   GI/Hepatic negative GI ROS, Neg liver ROS,,,  Endo/Other  diabetes  Morbid obesity  Renal/GU negative Renal ROS  negative genitourinary   Musculoskeletal negative musculoskeletal ROS (+)    Abdominal   Peds negative pediatric ROS (+)  Hematology negative hematology ROS (+)   Anesthesia Other Findings   Reproductive/Obstetrics negative OB ROS                             Anesthesia Physical Anesthesia Plan  ASA: 3  Anesthesia Plan: MAC   Post-op Pain Management: Minimal or no pain anticipated   Induction: Intravenous  PONV Risk Score and Plan: 1 and Propofol infusion and Treatment may vary due to age or medical condition  Airway Management Planned: Simple Face Mask  Additional Equipment:   Intra-op Plan:   Post-operative Plan:   Informed Consent: I have reviewed the patients History and Physical, chart, labs and discussed the procedure including the risks, benefits and alternatives for the proposed anesthesia with the patient or authorized representative who has indicated his/her understanding and acceptance.     Dental advisory given  Plan Discussed with: CRNA and Surgeon  Anesthesia Plan Comments:        Anesthesia Quick Evaluation

## 2022-01-26 NOTE — Op Note (Addendum)
[facility] Patient Name: Dennis Ayers Procedure Date: 01/26/2022 MRN: [id number] Attending MD: Gatha Mayer , MD, [id number] Date of Birth: 09-Aug-1950 CSN: [id number] Age: 71 Admit Type: Outpatient Procedure:                Colonoscopy Indications:              High risk colon cancer surveillance: Personal                            history of colonic polyps, Last colonoscopy:                            December 2022 Providers:                Gatha Mayer, MD, Cathlean Marseilles, [facility]                            Technician, Technician Referring MD:              Medicines:                Monitored Anesthesia Care Complications:            No immediate complications. Estimated Blood Loss:     Estimated blood loss was minimal. Procedure:                Pre-Anesthesia Assessment:                           - Prior to the procedure, a History and Physical                            was performed, and patient medications and                            allergies were reviewed. The patient's tolerance of                            previous anesthesia was also reviewed. The risks                            and benefits of the procedure and the sedation                            options and risks were discussed with the patient.                            All questions were answered, and informed consent                            was obtained. Prior Anticoagulants: The patient has                            taken no anticoagulant or antiplatelet agents. ASA                            Grade Assessment:  III - A patient with severe                            systemic disease. After reviewing the risks and                            benefits, the patient was deemed in satisfactory                            condition to undergo the procedure.                           After obtaining informed consent, the colonoscope                            was passed under direct  vision. Throughout the                            procedure, the patient's blood pressure, pulse, and                            oxygen saturations were monitored continuously. The                            CF-HQ190L ([id number]) Olympus colonoscope was                            introduced through the anus and advanced to the the                            cecum, identified by appendiceal orifice and                            ileocecal valve. The colonoscopy was somewhat                            difficult due to significant looping. Successful                            completion of the procedure was aided by abdominal                            binder and RUQ pressure. The patient tolerated the                            procedure well. The quality of the bowel                            preparation was adequate. The ileocecal valve,                            appendiceal orifice, and rectum were photographed.  The bowel preparation used was SUPREP via split                            dose instruction. Scope In: 9:11:38 AM Scope Out: 9:32:24 AM Scope Withdrawal Time: 0 hours 12 minutes 58 seconds  Total Procedure Duration: 0 hours 20 minutes 46 seconds  Findings:      The perianal and digital rectal examinations were normal.      A 1 to 2 mm polyp was found in the cecum. The polyp was sessile. The       polyp was removed with a cold biopsy forceps. Resection and retrieval       were complete. Verification of patient identification for the specimen       was done. Estimated blood loss was minimal.      Multiple diverticula were found in the entire colon.      The exam was otherwise without abnormality on direct and retroflexion       views. Impression:               - One 1 to 2 mm polyp in the cecum, removed with a                            cold biopsy forceps. Resected and retrieved.                           - Diverticulosis in the entire examined  colon.                           - The examination was otherwise normal on direct                            and retroflexion views.                           - Personal history of colonic polyps. 08/20/2011 -                            all diminutive 3 serrated adenomas and 1 tub                            adenoma - routine repeat colon                           07/22/2015 12 polyps removed 5-15 mm and one flat 25                            mm polyp left in cecum - will discuss options for                            removal 10 adenomas or ssp - will attempt                            colonoscopy removal of the 25 mm cecal polyp at  hospital                           09/10/15 25 mm flat/sessile tubular adenoma removed                            from cecum and small adenoma from ascending colon -                            recall                           10/2016 diminutive cecal adenoma                           11/16/2017 5 diminutive polyps removed - adenomas                            and ssp's                           22 total lifetime referred to genetics - testing                            negative                           12/2019 13 polyps mostly adenomas and ssp's w/ some                            hyperplastic                           December 2022 incomplete exam no polyps                           > 30 lifetime pre-cancerous polyps so suspect a                            polypsosis syndrome Moderate Sedation:      Not Applicable - Patient had care per Anesthesia. Recommendation:           - Patient has a contact number available for                            emergencies. The signs and symptoms of potential                            delayed complications were discussed with the                            patient. Return to normal activities tomorrow.                            Written discharge instructions were provided to the  patient.                           - Resume previous diet.                           - Continue present medications.                           - Await pathology results.                           - Repeat colonoscopy is recommended for                            surveillance. The colonoscopy date will be                            determined after pathology results from today's                            exam become available for review. requires hospital                            (glidescope used last year + had some obstruction                            issues today though easily treated) - definitely                            apply abdominal binder from start Procedure Code(s):        --- Professional ---                           [phone], Colonoscopy, flexible; with biopsy, single                            or multiple Diagnosis Code(s):        --- Professional ---                           Z86.010, Personal history of colonic polyps                           D12.0, Benign neoplasm of cecum                           K57.30, Diverticulosis of large intestine without                            perforation or abscess without bleeding CPT copyright 2022 [employer]. All rights reserved. The codes documented in this report are preliminary and upon coder review may  be revised to meet current compliance requirements. Gatha Mayer, MD 01/26/2022 9:50:02 AM This report has been signed electronically. Number of Addenda: 0

## 2022-01-26 NOTE — H&P (Signed)
[facility] History and Physical   Primary Care Physician:  Ria Bush, MD   Reason for Procedure:   polyposis  Plan:    colonoscopy     HPI: Dennis Ayers is a 71 y.o. male w/ following hx 08/20/2011 - all diminutive 3 serrated adenomas and 1 tub adenoma - routine repeat colon   07/22/2015 12 polyps removed 5-15 mm and one flat 25 mm polyp left in cecum - will discuss options for removal 10 adenomas or ssp - will attempt colonoscopy removal of the 25 mm cecal polyp at hospital 09/10/15 25 mm flat/sessile tubular adenoma removed from cecum and small adenoma from ascending colon - recall  10/2016 diminutive cecal adenoma  11/16/2017 5 diminutive polyps removed  - adenomas and ssp's 22 total lifetime referred to genetics - testing negative 12/2019 13 polyps mostly adenomas and ssp's w/ some hyperplastic December 2022 incomplete exam no polyps REPEAT LATE 2023 USE LARGER ABD BINDER, HAVE ENTEROSCOPE AVAILABLE IF NEEDED Gatha Mayer, MD, [facility]  Past Medical History:  Diagnosis Date   Allergy    Benign neoplasm of cecum - 25 mm polyp 08/06/2015   Cataract    Coronary artery calcification    Diabetes mellitus without complication (HCC)    DJD (degenerative joint disease) of knee    [place] steroid injection (01/2014)   Family history of ovarian cancer    Hyperglycemia    Hyperlipidemia    Hypertension    Jaundice    with icterus, when very young, treated and resolved   Knee pain    Morbid obesity ([place])    OSA (obstructive sleep apnea)    mild to mod, working on weight loss, no cpap needed per test   Osteoarthritis of knee 07/23/2011   blat s/p L TKR ([place])   Redundant colon 11/16/2017   Severe obesity (BMI >= 40) ([place]) 02/15/2008   Sleep apnea    does not have a cpap   Viral hepatitis 8th grade   has had jaundice in past, unsure of cause ?Hep A    Past Surgical History:  Procedure Laterality Date   CHOLECYSTECTOMY  1998   COLONOSCOPY   08/20/2011   4 adenomatous polyps - rpt 3 yrs Gatha Mayer)   COLONOSCOPY  07/22/2015   mult TAs, severe diverticulosis, one large 2.5cm cecal polyp pending resection Carlean Purl)   COLONOSCOPY N/A 09/10/2015   large TA, rpt 1 yr Gatha Mayer, MD)   COLONOSCOPY  11/2017   7 TAs, 3 SSPs, diverticulosis, rpt 2 yrs Carlean Purl)   COLONOSCOPY  12/2019   multiple TA and SSP, diverticulosis with excess colon looping, rpt 1 year ([place])   COLONOSCOPY WITH PROPOFOL N/A 10/29/2016   TA, rpt 1 yr Carlean Purl, Ofilia Neas, MD)   COLONOSCOPY WITH PROPOFOL N/A 01/26/2021   diverticulosis, int hem, significant looping of colon making complete exam difficult - rpt 1 yr Gatha Mayer, MD)   EYE SURGERY Right    laser   HOT HEMOSTASIS N/A 10/29/2016   Procedure: HOT HEMOSTASIS (ARGON PLASMA COAGULATION/BICAP);  Surgeon: Gatha Mayer, MD;  Location: [facility];  Service: Endoscopy;  Laterality: N/A;   TONSILLECTOMY     TOTAL KNEE ARTHROPLASTY Left 07/25/2012   Surgeon: Johnny Bridge, MD   Varus Gonarthrosis  04/02/2002   Dr. Alphonzo Severance    Prior to Admission medications   Medication Sig Start Date End Date Taking? Authorizing Provider  acetaminophen (TYLENOL) 500 MG tablet Take 1,000 mg by mouth every  6 (six) hours as needed for moderate pain or mild pain.   Yes [provider]  atorvastatin (LIPITOR) 20 MG tablet Take 1 tablet (20 mg total) by mouth daily. 10/12/21  Yes Ria Bush, MD  Continuous Blood Gluc Sensor (FREESTYLE LIBRE 3 SENSOR) MISC 1 Units by Does not apply route as directed. E11.8. Place 1 sensor on the skin every 14 days. Use to check glucose continuously 10/12/21  Yes Ria Bush, MD  doxylamine, Sleep, (UNISOM) 25 MG tablet Take 25 mg by mouth at bedtime.   Yes [provider]  Dulaglutide (TRULICITY) 4.28 JG/8.1LX SOPN Inject 0.75 mg into the skin once a week. Patient taking differently: Inject 0.75 mg into the skin every Sunday. 10/12/21  Yes  Ria Bush, MD  glucose blood [facility] ULTRA) test strip Check blood sugar once a day 12/26/20  Yes Ria Bush, MD  Krill Oil 300 MG CAPS Take 300 mg by mouth daily.   Yes [provider]  losartan (COZAAR) 100 MG tablet Take 1 tablet (100 mg total) by mouth daily. Patient taking differently: Take 100 mg by mouth daily at 6 PM. 10/12/21 10/07/22 Yes Ria Bush, MD  metFORMIN (GLUCOPHAGE) 500 MG tablet Take 1 tablet (500 mg total) by mouth daily with breakfast. 10/12/21  Yes Ria Bush, MD  Misc Natural Products (OSTEO BI-FLEX JOINT SHIELD PO) Take 1 capsule by mouth daily.    Yes [provider]  Multiple Vitamin (MULTIVITAMIN) tablet Take 1 tablet by mouth daily.   Yes [provider]  OneTouch Delica Lancets 72I MISC Use as directed to check blood sugar once daily as needed.  Dx code: E11.9 12/31/19  Yes Ria Bush, MD  aspirin EC 81 MG tablet Take 81 mg by mouth daily.    [provider]  Na Sulfate-K Sulfate-Mg Sulf 17.5-3.13-1.6 GM/177ML SOLN Take 1 kit by mouth as directed. 01/01/22   [provider]    Current Facility-Administered Medications  Medication Dose Route Frequency Provider Last Rate Last Admin   0.9 %  sodium chloride infusion   Intravenous Continuous Gatha Mayer, MD       lactated ringers infusion   Intravenous Continuous Gatha Mayer, MD 10 mL/hr at 01/26/22 0822 1,000 mL at 01/26/22 2035    Allergies as of 12/18/2021 - Review Complete 10/12/2021  Allergen Reaction Noted   Lisinopril Other (See Comments) 09/10/2016    Family History  Problem Relation Age of Onset   Ovarian cancer Mother    Ulcers Mother        stomach   Coronary artery disease Father 69       CABG X4   Hypertension Father    Stroke Father 59   Heart attack Father 53   Heart attack Sister 83   Heart Problems Paternal Aunt    Heart attack Paternal Aunt    Heart attack Paternal Uncle    Heart attack Paternal  Uncle    Diabetes Neg Hx    Prostate cancer Neg Hx    Breast cancer Neg Hx    Colon cancer Neg Hx    Depression Neg Hx    Alcohol abuse Neg Hx    Colon polyps Neg Hx    Stomach cancer Neg Hx    Rectal cancer Neg Hx    Esophageal cancer Neg Hx     Social History   Socioeconomic History   Marital status: Married    Spouse name: Not on file   Number of children:  1   Years of education: Not on file   Highest education level: Not on file  Occupational History   Occupation: Self-employed  Tobacco Use   Smoking status: Never   Smokeless tobacco: Never  Vaping Use   Vaping Use: Never used  Substance and Sexual Activity   Alcohol use: No   Drug use: No   Sexual activity: Not Currently  Other Topics Concern   Not on file  Social History Narrative   Caffeine: 2-3 diet sodas/day   Lives with wife   1 grown son.   Occupation: Self-employed; Psychologist, forensic   Activity: some golf, limited by knee pain   Diet: good water, daily vegetables   Social Determinants of Health   Financial Resource Strain: Low Risk  (10/01/2021)   Overall Financial Resource Strain (CARDIA)    Difficulty of Paying Living Expenses: Not hard at all  Food Insecurity: No Food Insecurity (10/01/2021)   Hunger Vital Sign    Worried About Running Out of Food in the Last Year: Never true    Ran Out of Food in the Last Year: Never true  Transportation Needs: No Transportation Needs (10/01/2021)   PRAPARE - Hydrologist (Medical): No    Lack of Transportation (Non-Medical): No  Physical Activity: Insufficiently Active (10/01/2021)   Exercise Vital Sign    Days of Exercise per Week: 2 days    Minutes of Exercise per Session: 10 min  Stress: No Stress Concern Present (10/01/2021)   [place]    Feeling of Stress : Not at all  Social Connections: Socially Isolated (10/01/2021)   Social Connection  and Isolation Panel [NHANES]    Frequency of Communication with Friends and Family: Once a week    Frequency of Social Gatherings with Friends and Family: Once a week    Attends Religious Services: Never    Marine scientist or Organizations: No    Attends Archivist Meetings: Never    Marital Status: Married  Human resources officer Violence: Not At Risk (10/01/2021)   Humiliation, Afraid, Rape, and Kick questionnaire    Fear of Current or Ex-Partner: No    Emotionally Abused: No    Physically Abused: No    Sexually Abused: No    Review of Systems:  All other review of systems negative except as mentioned in the HPI.  Physical Exam: Vital signs BP (!) 144/94   Pulse 100   Temp (!) 96.5 F (35.8 C) (Temporal)   Resp (!) 22   Ht _0  (1.88 m)   Wt (!) 139.3 kg   SpO2 97%   BMI 39.42 kg/m   General:   Alert,  Well-developed, well-nourished, pleasant and cooperative in NAD Lungs:  Clear throughout to auscultation.   Heart:  Regular rate and rhythm; no murmurs, clicks, rubs,  or gallops. Abdomen:  Soft, nontender and nondistended. Normal bowel sounds.   Neuro/Psych:  Alert and cooperative. Normal mood and affect. A and O x 3   _1  E. Carlean Purl, MD, [facility] [phone] (pager) 01/26/2022 8:55 AM@

## 2022-01-27 ENCOUNTER — Encounter: Payer: Self-pay | Admitting: Family Medicine

## 2022-01-27 LAB — SURGICAL PATHOLOGY

## 2022-01-28 ENCOUNTER — Encounter (HOSPITAL_COMMUNITY): Payer: Self-pay | Admitting: Internal Medicine

## 2022-01-28 ENCOUNTER — Encounter: Payer: Self-pay | Admitting: Internal Medicine

## 2022-03-08 ENCOUNTER — Encounter: Payer: Self-pay | Admitting: Family Medicine

## 2022-04-14 ENCOUNTER — Ambulatory Visit (INDEPENDENT_AMBULATORY_CARE_PROVIDER_SITE_OTHER): Payer: Medicare Other | Admitting: Family Medicine

## 2022-04-14 ENCOUNTER — Encounter: Payer: Self-pay | Admitting: Family Medicine

## 2022-04-14 VITALS — BP 138/74 | HR 96 | Temp 97.1°F | Ht 74.0 in | Wt 323.5 lb

## 2022-04-14 DIAGNOSIS — E118 Type 2 diabetes mellitus with unspecified complications: Secondary | ICD-10-CM | POA: Diagnosis not present

## 2022-04-14 DIAGNOSIS — G4733 Obstructive sleep apnea (adult) (pediatric): Secondary | ICD-10-CM

## 2022-04-14 LAB — POCT GLYCOSYLATED HEMOGLOBIN (HGB A1C): Hemoglobin A1C: 5.9 % — AB (ref 4.0–5.6)

## 2022-04-14 MED ORDER — TRULICITY 1.5 MG/0.5ML ~~LOC~~ SOAJ: 1.5000 mg | SUBCUTANEOUS | 11 refills | Status: DC

## 2022-04-14 NOTE — Progress Notes (Signed)
Patient ID: Dennis Ayers, male    DOB: 24-Jul-1950, 72 y.o.   MRN: [id number]  This visit was conducted in person.  BP 138/74   Pulse 96   Temp (!) 97.1 F (36.2 C) (Temporal)   Ht 6' 2"$  (1.88 m)   Wt (!) 323 lb 8 oz (146.7 kg)   SpO2 95%   BMI 41.53 kg/m    CC: DM f/u visit  Subjective:   HPI: Dennis Ayers is a 72 y.o. male presenting on 04/14/2022 for Medical Management of Chronic Issues (Here for 6 mo DM f/u.)   URI last month - with residual cough. Fall 2 wks ago while working in the yard without significant injury.  Weight gain noted - attributes to worse diet since he was ill last month.   OSA - HST 06/2012 showed mild OSA with AHI 18 and desat to 80%, not on CPAP. Denies daytime somnolence, morning headaches, nonrestorative sleep, loud snoring, witnessed apnea, PNdyspnea.   DM - does regularly check sugars with CGM and brings data: 30d time in range 90%, 10% (181-250), 30d average glu 154. Compliant with antihyperglycemic regimen which includes: trulicity XX123456 weekly, metformin 574m daily. Tolerating trulicity without n/c/d or epigastric pain. Denies low sugars or hypoglycemic symptoms. Denies paresthesias, blurry vision. Last diabetic eye exam 06/2021. Glucometer brand: freestyle libre 3 CGM. Last foot exam: 04/2021. DSME: [facility] [facility]. Lab Results  Component Value Date   HGBA1C 5.9 (A) 04/14/2022   Diabetic Foot Exam - Simple   Simple Foot Form Diabetic Foot exam was performed with the following findings: Yes 04/14/2022  8:20 AM  Visual Inspection No deformities, no ulcerations, no other skin breakdown bilaterally: Yes Sensation Testing Intact to touch and monofilament testing bilaterally: Yes Pulse Check Posterior Tibialis and Dorsalis pulse intact bilaterally: Yes Comments    Lab Results  Component Value Date   MICROALBUR 10.5 (H) 10/06/2021         Relevant past medical, surgical, family and social history reviewed and updated as indicated.  Interim medical history since our last visit reviewed. Allergies and medications reviewed and updated. Outpatient Medications Prior to Visit  Medication Sig Dispense Refill   acetaminophen (TYLENOL) 500 MG tablet Take 1,000 mg by mouth every 6 (six) hours as needed for moderate pain or mild pain.     aspirin EC 81 MG tablet Take 81 mg by mouth daily.     atorvastatin (LIPITOR) 20 MG tablet Take 1 tablet (20 mg total) by mouth daily. 90 tablet 3   Continuous Blood Gluc Sensor (FREESTYLE LIBRE 3 SENSOR) MISC 1 Units by Does not apply route as directed. E11.8. Place 1 sensor on the skin every 14 days. Use to check glucose continuously 6 each 3   doxylamine, Sleep, (UNISOM) 25 MG tablet Take 25 mg by mouth at bedtime.     glucose blood (ONETOUCH ULTRA) test strip Check blood sugar once a day 100 strip 3   Krill Oil 300 MG CAPS Take 300 mg by mouth daily.     losartan (COZAAR) 100 MG tablet Take 1 tablet (100 mg total) by mouth daily. (Patient taking differently: Take 100 mg by mouth daily at 6 PM.) 90 tablet 3   metFORMIN (GLUCOPHAGE) 500 MG tablet Take 1 tablet (500 mg total) by mouth daily with breakfast. 90 tablet 3   Misc Natural Products (OSTEO BI-FLEX JOINT SHIELD PO) Take 1 capsule by mouth daily.      Multiple Vitamin (MULTIVITAMIN) tablet Take 1  tablet by mouth daily.     OneTouch Delica Lancets 99991111 MISC Use as directed to check blood sugar once daily as needed.  Dx code: E11.9 100 each 3   Dulaglutide (TRULICITY) A999333 0000000 SOPN Inject 0.75 mg into the skin once a week. (Patient taking differently: Inject 0.75 mg into the skin every Sunday.) 2 mL 11   No facility-administered medications prior to visit.     Per HPI unless specifically indicated in ROS section below Review of Systems  Objective:  BP 138/74   Pulse 96   Temp (!) 97.1 F (36.2 C) (Temporal)   Ht 6' 2"$  (1.88 m)   Wt (!) 323 lb 8 oz (146.7 kg)   SpO2 95%   BMI 41.53 kg/m   Wt Readings from Last 3 Encounters:   04/14/22 (!) 323 lb 8 oz (146.7 kg)  01/26/22 (!) 307 lb (139.3 kg)  01/01/22 (!) 325 lb (147.4 kg)      Physical Exam Vitals and nursing note reviewed.  Constitutional:      Appearance: Normal appearance. He is not ill-appearing.  HENT:     Mouth/Throat:     Comments: Wearing mask Eyes:     Extraocular Movements: Extraocular movements intact.     Conjunctiva/sclera: Conjunctivae normal.     Pupils: Pupils are equal, round, and reactive to light.  Cardiovascular:     Rate and Rhythm: Normal rate and regular rhythm.     Pulses: Normal pulses.     Heart sounds: Normal heart sounds. No murmur heard. Pulmonary:     Effort: Pulmonary effort is normal. No respiratory distress.     Breath sounds: Normal breath sounds. No wheezing, rhonchi or rales.  Musculoskeletal:     Right lower leg: No edema.     Left lower leg: No edema.     Comments: See HPI for foot exam if done  Skin:    General: Skin is warm and dry.     Findings: No rash.  Neurological:     Mental Status: He is alert.  Psychiatric:        Mood and Affect: Mood normal.        Behavior: Behavior normal.       Results for orders placed or performed in visit on 04/14/22  POCT glycosylated hemoglobin (Hb A1C)  Result Value Ref Range   Hemoglobin A1C 5.9 (A) 4.0 - 5.6 %   HbA1c POC (<> result, manual entry)     HbA1c, POC (prediabetic range)     HbA1c, POC (controlled diabetic range)      Assessment & Plan:   Problem List Items Addressed This Visit     Severe obesity (BMI >= 40) (HCC)    Weight gain noted. Continue to encourage healthy diet and lifestyle choices to affect sustainable weight loss.  Increase trulicity to XX123456 weekly, reassess at CPE.       Relevant Medications   Dulaglutide (TRULICITY) 1.5 0000000 SOPN   OSA (obstructive sleep apnea)    HST 06/2012 showed mild OSA with AHI 18 and desat to 80%, not on CPAP. Denies daytime somnolence, morning headaches, nonrestorative sleep, loud snoring,  witnessed apnea, PNdyspnea.  ESS = 2.      Controlled diabetes mellitus type 2 with complications (HCC) - Primary    Chronic, stable. Increase Trulicity to XX123456 weekly, monitoring for hypoglycemia.       Relevant Medications   Dulaglutide (TRULICITY) 1.5 0000000 SOPN   Other Relevant Orders   POCT glycosylated  hemoglobin (Hb A1C) (Completed)     Meds ordered this encounter  Medications   Dulaglutide (TRULICITY) 1.5 0000000 SOPN    Sig: Inject 1.5 mg into the skin once a week.    Dispense:  2 mL    Refill:  11    Note new dose    Orders Placed This Encounter  Procedures   POCT glycosylated hemoglobin (Hb A1C)    Patient Instructions  Good to see you today Increase Trulicity to XX123456 weekly.  Return in 6 months for wellness visit/physical  Follow up plan: Return in about 6 months (around 10/13/2022), or if symptoms worsen or fail to improve, for annual exam, prior fasting for blood work.  Ria Bush, MD

## 2022-04-14 NOTE — Assessment & Plan Note (Signed)
Weight gain noted. Continue to encourage healthy diet and lifestyle choices to affect sustainable weight loss.  Increase trulicity to XX123456 weekly, reassess at CPE.

## 2022-04-14 NOTE — Assessment & Plan Note (Addendum)
Chronic, stable. Increase Trulicity to XX123456 weekly, monitoring for hypoglycemia.

## 2022-04-14 NOTE — Patient Instructions (Addendum)
Good to see you today Increase Trulicity to XX123456 weekly.  Return in 6 months for wellness visit/physical

## 2022-04-14 NOTE — Assessment & Plan Note (Signed)
HST 06/2012 showed mild OSA with AHI 18 and desat to 80%, not on CPAP. Denies daytime somnolence, morning headaches, nonrestorative sleep, loud snoring, witnessed apnea, PNdyspnea.  ESS = 2.

## 2022-04-15 DIAGNOSIS — H903 Sensorineural hearing loss, bilateral: Secondary | ICD-10-CM | POA: Diagnosis not present

## 2022-04-15 DIAGNOSIS — H6123 Impacted cerumen, bilateral: Secondary | ICD-10-CM | POA: Diagnosis not present

## 2022-06-21 ENCOUNTER — Other Ambulatory Visit: Payer: Self-pay | Admitting: Family Medicine

## 2022-06-21 DIAGNOSIS — I10 Essential (primary) hypertension: Secondary | ICD-10-CM

## 2022-06-21 DIAGNOSIS — E1169 Type 2 diabetes mellitus with other specified complication: Secondary | ICD-10-CM

## 2022-07-16 DIAGNOSIS — H25013 Cortical age-related cataract, bilateral: Secondary | ICD-10-CM | POA: Diagnosis not present

## 2022-07-16 LAB — HM DIABETES EYE EXAM

## 2022-07-24 ENCOUNTER — Other Ambulatory Visit: Payer: Self-pay | Admitting: Family Medicine

## 2022-07-24 DIAGNOSIS — E118 Type 2 diabetes mellitus with unspecified complications: Secondary | ICD-10-CM

## 2022-08-08 ENCOUNTER — Other Ambulatory Visit: Payer: Self-pay | Admitting: Family Medicine

## 2022-09-16 ENCOUNTER — Ambulatory Visit: Payer: Medicare Other | Attending: Internal Medicine | Admitting: Internal Medicine

## 2022-09-16 ENCOUNTER — Encounter: Payer: Self-pay | Admitting: Internal Medicine

## 2022-09-16 VITALS — BP 128/78 | HR 91 | Ht 74.5 in | Wt 321.6 lb

## 2022-09-16 DIAGNOSIS — I2510 Atherosclerotic heart disease of native coronary artery without angina pectoris: Secondary | ICD-10-CM

## 2022-09-16 DIAGNOSIS — I10 Essential (primary) hypertension: Secondary | ICD-10-CM | POA: Diagnosis not present

## 2022-09-16 DIAGNOSIS — I2584 Coronary atherosclerosis due to calcified coronary lesion: Secondary | ICD-10-CM

## 2022-09-16 DIAGNOSIS — E785 Hyperlipidemia, unspecified: Secondary | ICD-10-CM | POA: Diagnosis not present

## 2022-09-16 DIAGNOSIS — E1169 Type 2 diabetes mellitus with other specified complication: Secondary | ICD-10-CM

## 2022-09-16 DIAGNOSIS — Z7984 Long term (current) use of oral hypoglycemic drugs: Secondary | ICD-10-CM

## 2022-09-16 NOTE — Patient Instructions (Signed)
Medication Instructions:  Your physician recommends that you continue on your current medications as directed. Please refer to the Current Medication list given to you today.   *If you need a refill on your cardiac medications before your next appointment, please call your pharmacy*   Lab Work: None ordered today  Testing/Procedures: None ordered today   Follow-Up: At [facility], you and your health needs are our priority.  As part of our continuing mission to provide you with exceptional heart care, we have created designated Provider Care Teams.  These Care Teams include your primary Cardiologist (physician) and Advanced Practice Providers (APPs -  Physician Assistants and Nurse Practitioners) who all work together to provide you with the care you need, when you need it.  We recommend signing up for the patient portal called "MyChart".  Sign up information is provided on this After Visit Summary.  MyChart is used to connect with patients for Virtual Visits (Telemedicine).  Patients are able to view lab/test results, encounter notes, upcoming appointments, etc.  Non-urgent messages can be sent to your provider as well.   To learn more about what you can do with MyChart, go to [email or web address].    Your next appointment:   1 year(s)  Provider:   You may see Yvonne Kendall, MD or one of the following Advanced Practice Providers on your designated Care Team:   Nicolasa Ducking, NP Eula Listen, PA-C Cadence Fransico Michael, PA-C Charlsie Quest, NP

## 2022-09-16 NOTE — Progress Notes (Signed)
  Cardiology Office Note:  .   Date:  09/16/2022  ID:  Ayers, Dennis June 18, 1950, MRN [id number] PCP: Eustaquio Boyden, MD  [facility] Providers Cardiologist:  Yvonne Kendall, MD     History of Present Illness: .   Dennis Ayers is a 72 y.o. male history of coronary artery calcification, type 2 diabetes mellitus, morbid obesity, obstructive sleep apnea, and degenerative joint disease, who presents for follow-up of coronary artery disease and dilated aortic root.  I last saw him a year ago, which time he was feeling well and tolerating increased dose of losartan well.  We did not make any medication changes.  Today, [name] reports that he has been feeling well, denying chest pain, shortness of breath, palpitations, lightheadedness, and edema.  He has been under quite a bit of stress due to health issues with his wife.  He does not exercise regularly but continues to spend a lot of time in his garden.  He reports that his smart watch recorded heart rates in the 120 recently while he was working outside in the heat.  He had been sweating a lot on account of the high temperatures and humidity.  He did not have any palpitations or lightheadedness.  ROS: See HPI  Studies Reviewed: Marland Kitchen   EKG Interpretation Date/Time:  Thursday September 16 2022 08:11:08 EDT Ventricular Rate:  91 PR Interval:  184 QRS Duration:  94 QT Interval:  370 QTC Calculation: 455 R Axis:   -44  Text Interpretation: Sinus rhythm with Premature atrial complexes Left axis deviation When compared with ECG of 28-Aug-2021 Premature atrial complexes are now Present Confirmed by Maxime Beckner, Cristal Deer [phone]) on 09/16/2022 3:00:00 PM    Coronary calcium score (06/04/2016): Coronary calcium score 102 (53rd percentile for age and sex matched control).  No significant extracardiac findings. Risk Assessment/Calculations:             Physical Exam:   VS:  BP 128/78   Pulse 91   Ht 6' 2.5" (1.892 m)   Wt (!) 321 lb 9.6 oz  (145.9 kg)   SpO2 96%   BMI 40.74 kg/m    Repeat BP: 128/78  Wt Readings from Last 3 Encounters:  09/16/22 (!) 321 lb 9.6 oz (145.9 kg)  04/14/22 (!) 323 lb 8 oz (146.7 kg)  01/26/22 (!) 307 lb (139.3 kg)    General:  NAD. Neck: No JVD or HJR. Lungs: Clear to auscultation bilaterally without wheezes or crackles. Heart: Regular rate and rhythm with occasional extrasystoles.  No murmurs, rubs, or gallops. Abdomen: Soft, nontender, nondistended. Extremities: No lower extremity edema.  ASSESSMENT AND PLAN: .    Coronary artery calcification: No angina noted.  Continue current regimen of aspirin and atorvastatin to prevent progression of disease.  Hypertension: BP upper normal today.  Defer medication changes.  Hyperlipidemia associated with type 2 diabetes mellitus: Mr. Smail reports good glycemic control at home.  Lipids were excellent on last check in 09/2021.  We will continue atorvastatin 20 mg daily.  Follow-up labs scheduled with Dr. Sharen Hones next month.  Morbid obesity: BMI remains > 40.  Weight loss encouraged through diet and exercise.    Dispo: Return to clinic in 1 year.  Signed, Yvonne Kendall, MD

## 2022-09-22 ENCOUNTER — Encounter (INDEPENDENT_AMBULATORY_CARE_PROVIDER_SITE_OTHER): Payer: Self-pay

## 2022-10-02 ENCOUNTER — Other Ambulatory Visit: Payer: Self-pay | Admitting: Family Medicine

## 2022-10-02 DIAGNOSIS — D751 Secondary polycythemia: Secondary | ICD-10-CM

## 2022-10-02 DIAGNOSIS — E1169 Type 2 diabetes mellitus with other specified complication: Secondary | ICD-10-CM

## 2022-10-02 DIAGNOSIS — Z125 Encounter for screening for malignant neoplasm of prostate: Secondary | ICD-10-CM

## 2022-10-02 DIAGNOSIS — E118 Type 2 diabetes mellitus with unspecified complications: Secondary | ICD-10-CM

## 2022-10-04 ENCOUNTER — Ambulatory Visit (INDEPENDENT_AMBULATORY_CARE_PROVIDER_SITE_OTHER): Payer: Medicare Other

## 2022-10-04 VITALS — Ht 74.5 in | Wt 321.0 lb

## 2022-10-04 DIAGNOSIS — Z0000 Encounter for general adult medical examination without abnormal findings: Secondary | ICD-10-CM | POA: Diagnosis not present

## 2022-10-04 NOTE — Patient Instructions (Signed)
Dennis Ayers , Thank you for taking time to come for your Medicare Wellness Visit. I appreciate your ongoing commitment to your health goals. Please review the following plan we discussed and let me know if I can assist you in the future.   Referrals/Orders/Follow-Ups/Clinician Recommendations: Aim for 30 minutes of exercise or brisk walking, 6-8 glasses of water, and 5 servings of fruits and vegetables each day.   This is a list of the screening recommended for you and due dates:  Health Maintenance  Topic Date Due   COVID-19 Vaccine (8 - 2023-24 season) 01/04/2022   Eye exam for diabetics  07/16/2022   Medicare Annual Wellness Visit  10/02/2022   Flu Shot  09/23/2022   Yearly kidney function blood test for diabetes  10/07/2022   Yearly kidney health urinalysis for diabetes  10/07/2022   Hemoglobin A1C  10/13/2022   Complete foot exam   04/15/2023   Colon Cancer Screening  01/27/2024   DTaP/Tdap/Td vaccine (3 - Td or Tdap) 07/09/2025   Pneumonia Vaccine  Completed   Hepatitis C Screening  Completed   Zoster (Shingles) Vaccine  Completed   HPV Vaccine  Aged Out    Advanced directives: (In Chart) A copy of your advanced directives are scanned into your chart should your provider ever need it.  Next Medicare Annual Wellness Visit scheduled for next year: Yes  Preventive Care 50 Years and Older, Male  Preventive care refers to lifestyle choices and visits with your health care provider that can promote health and wellness. What does preventive care include? A yearly physical exam. This is also called an annual well check. Dental exams once or twice a year. Routine eye exams. Ask your health care provider how often you should have your eyes checked. Personal lifestyle choices, including: Daily care of your teeth and gums. Regular physical activity. Eating a healthy diet. Avoiding tobacco and drug use. Limiting alcohol use. Practicing safe sex. Taking low doses of aspirin every  day. Taking vitamin and mineral supplements as recommended by your health care provider. What happens during an annual well check? The services and screenings done by your health care provider during your annual well check will depend on your age, overall health, lifestyle risk factors, and family history of disease. Counseling  Your health care provider may ask you questions about your: Alcohol use. Tobacco use. Drug use. Emotional well-being. Home and relationship well-being. Sexual activity. Eating habits. History of falls. Memory and ability to understand (cognition). Work and work Astronomer. Screening  You may have the following tests or measurements: Height, weight, and BMI. Blood pressure. Lipid and cholesterol levels. These may be checked every 5 years, or more frequently if you are over 46 years old. Skin check. Lung cancer screening. You may have this screening every year starting at age 33 if you have a 30-pack-year history of smoking and currently smoke or have quit within the past 15 years. Fecal occult blood test (FOBT) of the stool. You may have this test every year starting at age 57. Flexible sigmoidoscopy or colonoscopy. You may have a sigmoidoscopy every 5 years or a colonoscopy every 10 years starting at age 31. Prostate cancer screening. Recommendations will vary depending on your family history and other risks. Hepatitis C blood test. Hepatitis B blood test. Sexually transmitted disease (STD) testing. Diabetes screening. This is done by checking your blood sugar (glucose) after you have not eaten for a while (fasting). You may have this done every 1-3 years. Abdominal  aortic aneurysm (AAA) screening. You may need this if you are a current or former smoker. Osteoporosis. You may be screened starting at age 6 if you are at high risk. Talk with your health care provider about your test results, treatment options, and if necessary, the need for more  tests. Vaccines  Your health care provider may recommend certain vaccines, such as: Influenza vaccine. This is recommended every year. Tetanus, diphtheria, and acellular pertussis (Tdap, Td) vaccine. You may need a Td booster every 10 years. Zoster vaccine. You may need this after age 80. Pneumococcal 13-valent conjugate (PCV13) vaccine. One dose is recommended after age 70. Pneumococcal polysaccharide (PPSV23) vaccine. One dose is recommended after age 7. Talk to your health care provider about which screenings and vaccines you need and how often you need them. This information is not intended to replace advice given to you by your health care provider. Make sure you discuss any questions you have with your health care provider. Document Released: 03/07/2015 Document Revised: 10/29/2015 Document Reviewed: 12/10/2014 Elsevier Interactive Patient Education  2017 [employer].  Fall Prevention in the Home Falls can cause injuries. They can happen to people of all ages. There are many things you can do to make your home safe and to help prevent falls. What can I do on the outside of my home? Regularly fix the edges of walkways and driveways and fix any cracks. Remove anything that might make you trip as you walk through a door, such as a raised step or threshold. Trim any bushes or trees on the path to your home. Use bright outdoor lighting. Clear any walking paths of anything that might make someone trip, such as rocks or tools. Regularly check to see if handrails are loose or broken. Make sure that both sides of any steps have handrails. Any raised decks and porches should have guardrails on the edges. Have any leaves, snow, or ice cleared regularly. Use sand or salt on walking paths during winter. Clean up any spills in your garage right away. This includes oil or grease spills. What can I do in the bathroom? Use night lights. Install grab bars by the toilet and in the tub and shower.  Do not use towel bars as grab bars. Use non-skid mats or decals in the tub or shower. If you need to sit down in the shower, use a plastic, non-slip stool. Keep the floor dry. Clean up any water that spills on the floor as soon as it happens. Remove soap buildup in the tub or shower regularly. Attach bath mats securely with double-sided non-slip rug tape. Do not have throw rugs and other things on the floor that can make you trip. What can I do in the bedroom? Use night lights. Make sure that you have a light by your bed that is easy to reach. Do not use any sheets or blankets that are too big for your bed. They should not hang down onto the floor. Have a firm chair that has side arms. You can use this for support while you get dressed. Do not have throw rugs and other things on the floor that can make you trip. What can I do in the kitchen? Clean up any spills right away. Avoid walking on wet floors. Keep items that you use a lot in easy-to-reach places. If you need to reach something above you, use a strong step stool that has a grab bar. Keep electrical cords out of the way. Do not use  floor polish or wax that makes floors slippery. If you must use wax, use non-skid floor wax. Do not have throw rugs and other things on the floor that can make you trip. What can I do with my stairs? Do not leave any items on the stairs. Make sure that there are handrails on both sides of the stairs and use them. Fix handrails that are broken or loose. Make sure that handrails are as long as the stairways. Check any carpeting to make sure that it is firmly attached to the stairs. Fix any carpet that is loose or worn. Avoid having throw rugs at the top or bottom of the stairs. If you do have throw rugs, attach them to the floor with carpet tape. Make sure that you have a light switch at the top of the stairs and the bottom of the stairs. If you do not have them, ask someone to add them for you. What else  can I do to help prevent falls? Wear shoes that: Do not have high heels. Have rubber bottoms. Are comfortable and fit you well. Are closed at the toe. Do not wear sandals. If you use a stepladder: Make sure that it is fully opened. Do not climb a closed stepladder. Make sure that both sides of the stepladder are locked into place. Ask someone to hold it for you, if possible. Clearly mark and make sure that you can see: Any grab bars or handrails. First and last steps. Where the edge of each step is. Use tools that help you move around (mobility aids) if they are needed. These include: Canes. Walkers. Scooters. Crutches. Turn on the lights when you go into a dark area. Replace any light bulbs as soon as they burn out. Set up your furniture so you have a clear path. Avoid moving your furniture around. If any of your floors are uneven, fix them. If there are any pets around you, be aware of where they are. Review your medicines with your doctor. Some medicines can make you feel dizzy. This can increase your chance of falling. Ask your doctor what other things that you can do to help prevent falls. This information is not intended to replace advice given to you by your health care provider. Make sure you discuss any questions you have with your health care provider. Document Released: 12/05/2008 Document Revised: 07/17/2015 Document Reviewed: 03/15/2014 Elsevier Interactive Patient Education  2017 [employer].

## 2022-10-04 NOTE — Progress Notes (Signed)
Subjective:   Dennis Ayers is a 72 y.o. male who presents for Medicare Annual/Subsequent preventive examination.  Visit Complete: Virtual  I connected with  Cloretta Ned on 10/04/22 by a audio enabled telemedicine application and verified that I am speaking with the correct person using two identifiers.  Patient Location: Home  Provider Location: Home Office  I discussed the limitations of evaluation and management by telemedicine. The patient expressed understanding and agreed to proceed.  Patient Medicare AWV questionnaire was completed by the patient on 10/01/22; I have confirmed that all information answered by patient is correct and no changes since this date.  Vital Signs: Unable to obtain new vitals due to this being a telehealth visit.   Review of Systems      Cardiac Risk Factors include: advanced age (>10men, >75 women);hypertension;male gender;diabetes mellitus;dyslipidemia;obesity (BMI >30kg/m2);sedentary lifestyle     Objective:    Today's Vitals   10/04/22 0846  Weight: (!) 321 lb (145.6 kg)  Height: 6' 2.5" (1.892 m)   Body mass index is 40.66 kg/m.     10/04/2022    8:55 AM 01/26/2022    7:57 AM 10/01/2021    8:17 AM 01/26/2021    6:48 AM 09/23/2020    1:17 PM 10/02/2019    9:58 AM 09/25/2018    9:11 AM  Advanced Directives  Does Patient Have a Medical Advance Directive? Yes No;Yes Yes No Yes Yes Yes  Type of Estate agent of [place];Living will  Healthcare Power of [place];Living will  Healthcare Power of [place];Living will Healthcare Power of [place];Living will Healthcare Power of [place];Living will  Does patient want to make changes to medical advance directive? No - Patient declined  Yes (Inpatient - patient defers changing a medical advance directive and declines information at this time)      Copy of Healthcare Power of Attorney in Chart? Yes - validated most recent copy scanned in chart (See row information)  Yes -  validated most recent copy scanned in chart (See row information)  Yes - validated most recent copy scanned in chart (See row information) Yes - validated most recent copy scanned in chart (See row information) Yes - validated most recent copy scanned in chart (See row information)  Would patient like information on creating a medical advance directive?    No - Patient declined       Current Medications (verified) Outpatient Encounter Medications as of 10/04/2022  Medication Sig   acetaminophen (TYLENOL) 500 MG tablet Take 1,000 mg by mouth every 6 (six) hours as needed for moderate pain or mild pain.   aspirin EC 81 MG tablet Take 81 mg by mouth daily.   atorvastatin (LIPITOR) 20 MG tablet TAKE 1 TABLET BY MOUTH DAILY   Continuous Blood Gluc Sensor (FREESTYLE LIBRE 3 SENSOR) MISC 1 Units by Does not apply route as directed. E11.8. Place 1 sensor on the skin every 14 days. Use to check glucose continuously   doxylamine, Sleep, (UNISOM) 25 MG tablet Take 25 mg by mouth at bedtime.   Dulaglutide (TRULICITY) 1.5 MG/0.5ML SOPN Inject 1.5 mg into the skin once a week.   glucose blood (ONETOUCH ULTRA) test strip Check blood sugar once a day   Krill Oil 300 MG CAPS Take 300 mg by mouth daily.   losartan (COZAAR) 100 MG tablet TAKE 1 TABLET BY MOUTH DAILY   metFORMIN (GLUCOPHAGE) 500 MG tablet TAKE 1 TABLET BY MOUTH DAILY  WITH BREAKFAST   Misc Natural Products (  OSTEO BI-FLEX JOINT SHIELD PO) Take 1 capsule by mouth daily.    Multiple Vitamin (MULTIVITAMIN) tablet Take 1 tablet by mouth daily.   OneTouch Delica Lancets 33G MISC Use as directed to check blood sugar once daily as needed.  Dx code: E11.9   No facility-administered encounter medications on file as of 10/04/2022.    Allergies (verified) Lisinopril   History: Past Medical History:  Diagnosis Date   Allergy    Benign neoplasm of cecum - 25 mm polyp 08/06/2015   Cataract    Coronary artery calcification    Diabetes mellitus without  complication (HCC)    DJD (degenerative joint disease) of knee    [place] steroid injection (01/2014)   Family history of ovarian cancer    Hyperglycemia    Hyperlipidemia    Hypertension    Jaundice    with icterus, when very young, treated and resolved   Knee pain    Morbid obesity (HCC)    OSA (obstructive sleep apnea)    mild to mod, working on weight loss, no cpap needed per test   Osteoarthritis of knee 07/23/2011   blat s/p L TKR ([place])   Redundant colon 11/16/2017   Severe obesity (BMI >= 40) (HCC) 02/15/2008   Sleep apnea    does not have a cpap   Viral hepatitis 8th grade   has had jaundice in past, unsure of cause ?Hep A   Past Surgical History:  Procedure Laterality Date   CHOLECYSTECTOMY  1998   COLONOSCOPY  08/20/2011   4 adenomatous polyps - rpt 3 yrs Iva Boop)   COLONOSCOPY  07/22/2015   mult TAs, severe diverticulosis, one large 2.5cm cecal polyp pending resection Leone Payor)   COLONOSCOPY N/A 09/10/2015   large TA, rpt 1 yr Iva Boop, MD)   COLONOSCOPY  11/2017   7 TAs, 3 SSPs, diverticulosis, rpt 2 yrs Leone Payor)   COLONOSCOPY  12/2019   multiple TA and SSP, diverticulosis with excess colon looping, rpt 1 year ([facility])   COLONOSCOPY  01/2022   TA, diverticulosis Leone Payor)   COLONOSCOPY WITH PROPOFOL N/A 10/29/2016   TA, rpt 1 yr [facility] Pay[facility], Maryjean Morn, MD)   COLONOSCOPY WITH PROPOFOL N/A 01/26/2021   diverticulosis, int hem, significant looping of colon making complete exam difficult - rpt 1 yr [facility] Payor, Maryjean Morn, MD)   COLONOSCOPY WITH PROPOFOL N/A 01/26/2022   TA, diverticulosis, rpt 2 yrs Iva Boop, MD)   EYE SURGERY Right    laser   HOT HEMOSTASIS N/A 10/29/2016   Procedure: HOT HEMOSTASIS (ARGON PLASMA COAGULATION/BICAP);  Surgeon: Iva Boop, MD;  Location: [facility];  Service: Endoscopy;  Laterality: N/A;   POLYPECTOMY  01/26/2022   Procedure: POLYPECTOMY;  Surgeon: Iva Boop, MD;  Location: [facility];  Service:  Gastroenterology;;   TONSILLECTOMY     TOTAL KNEE ARTHROPLASTY Left 07/25/2012   Surgeon: Eulas Post, MD   Varus Gonarthrosis  04/02/2002   Dr. Dorene Grebe   Family History  Problem Relation Age of Onset   Ovarian cancer Mother    Ulcers Mother        stomach   Coronary artery disease Father 8       CABG X4   Hypertension Father    Stroke Father 67   Heart attack Father 15   Heart attack Sister 53   Heart Problems Paternal Aunt    Heart attack Paternal Aunt    Heart attack Paternal Uncle    Heart  attack Paternal Uncle    Diabetes Neg Hx    Prostate cancer Neg Hx    Breast cancer Neg Hx    Colon cancer Neg Hx    Depression Neg Hx    Alcohol abuse Neg Hx    Colon polyps Neg Hx    Stomach cancer Neg Hx    Rectal cancer Neg Hx    Esophageal cancer Neg Hx    Social History   Socioeconomic History   Marital status: Married    Spouse name: Not on file   Number of children: 1   Years of education: Not on file   Highest education level: Not on file  Occupational History   Occupation: Self-employed  Tobacco Use   Smoking status: Never   Smokeless tobacco: Never  Vaping Use   Vaping status: Never Used  Substance and Sexual Activity   Alcohol use: No   Drug use: No   Sexual activity: Not Currently  Other Topics Concern   Not on file  Social History Narrative   Caffeine: 2-3 diet sodas/day   Lives with wife   1 grown son.   Occupation: Self-employed; Administrator   Activity: some golf, limited by knee pain   Diet: good water, daily vegetables   Social Determinants of Health   Financial Resource Strain: Low Risk  (10/04/2022)   Overall Financial Resource Strain (CARDIA)    Difficulty of Paying Living Expenses: Not hard at all  Food Insecurity: No Food Insecurity (10/04/2022)   Hunger Vital Sign    Worried About Running Out of Food in the Last Year: Never true    Ran Out of Food in the Last Year: Never true  Transportation Needs:  No Transportation Needs (10/04/2022)   PRAPARE - Administrator, Civil Service (Medical): No    Lack of Transportation (Non-Medical): No  Physical Activity: Inactive (10/04/2022)   Exercise Vital Sign    Days of Exercise per Week: 0 days    Minutes of Exercise per Session: 0 min  Stress: No Stress Concern Present (10/04/2022)   [employer] of Occupational Health - Occupational Stress Questionnaire    Feeling of Stress : Not at all  Social Connections: Moderately Isolated (10/04/2022)   Social Connection and Isolation Panel [NHANES]    Frequency of Communication with Friends and Family: More than three times a week    Frequency of Social Gatherings with Friends and Family: More than three times a week    Attends Religious Services: Never    Database administrator or Organizations: No    Attends Engineer, structural: Never    Marital Status: Married    Tobacco Counseling Counseling given: Not Answered   Clinical Intake:  Pre-visit preparation completed: Yes  Pain : No/denies pain     BMI - recorded: 40.66 Nutritional Status: BMI > 30  Obese Nutritional Risks: None Diabetes: Yes CBG done?: Yes (169 per pt) CBG resulted in Enter/ Edit results?: No Did pt. bring in CBG monitor from home?: No  How often do you need to have someone help you when you read instructions, pamphlets, or other written materials from your doctor or pharmacy?: 1 - Never  Interpreter Needed?: No  Information entered by :: C.Saidee Geremia LPN   Activities of Daily Living    10/01/2022    9:02 AM  In your present state of health, do you have any difficulty performing the following activities:  Hearing? 0  Vision? 0  Difficulty concentrating or making decisions? 0  Walking or climbing stairs? 0  Dressing or bathing? 0  Doing errands, shopping? 0  Preparing Food and eating ? N  Using the Toilet? N  In the past six months, have you accidently leaked urine? N  Do you have  problems with loss of bowel control? N  Managing your Medications? N  Managing your Finances? N  Housekeeping or managing your Housekeeping? N    Patient Care Team: Eustaquio Boyden, MD as PCP - General (Family Medicine) End, Cristal Deer, MD as PCP - Cardiology (Cardiology) Teryl Lucy, MD as Consulting Physician (Orthopedic Surgery) End, Cristal Deer, MD as Consulting Physician (Cardiology) Iva Boop, MD as Consulting Physician (Gastroenterology) Fredrich Birks, OD as Consulting Physician (Optometry) Vilinda Flake, [facility] (Inactive) as Pharmacist (Pharmacist)  Indicate any recent Medical Services you may have received from other than [facility] providers in the past year (date may be approximate).     Assessment:   This is a routine wellness examination for [place].  Hearing/Vision screen Hearing Screening - Comments:: Denies hearing difficulties   Vision Screening - Comments:: Glasses - UTD on Eye exams - Unknown Provider  Dietary issues and exercise activities discussed:     Goals Addressed             This Visit's Progress    Patient Stated       Be more active.       Depression Screen    10/04/2022    8:54 AM 04/14/2022    8:10 AM 10/01/2021    8:15 AM 09/23/2020    1:19 PM 10/02/2019   10:01 AM 09/25/2018    9:12 AM 09/09/2017    9:13 AM  PHQ 2/9 Scores  PHQ - 2 Score 0 0 0 0 0 0 0  PHQ- 9 Score   0 0 0 1 0    Fall Risk    10/01/2022    9:02 AM 04/14/2022    8:09 AM 10/01/2021    8:17 AM 09/28/2021   11:23 PM 09/23/2020    1:19 PM  Fall Risk   Falls in the past year? 0 1 0 0 0  Number falls in past yr: 0 0 0  0  Injury with Fall? 0 0 0 0 0  Risk for fall due to : No Fall Risks  No Fall Risks  Medication side effect  Follow up Falls prevention discussed;Falls evaluation completed  Falls evaluation completed  Falls evaluation completed;Falls prevention discussed    MEDICARE RISK AT HOME:   TIMED UP AND GO:  Was the test performed?  No    Cognitive  Function:    09/23/2020    1:23 PM 10/02/2019   10:02 AM 09/25/2018    9:16 AM 09/09/2017    9:14 AM 09/03/2016    9:02 AM  MMSE - Mini Mental State Exam  Orientation to time 5 5 5 5 5   Orientation to Place 5 5 5 5 5   Registration 3 3 3 3 3   Attention/ Calculation 5 5 5  0 0  Recall 3 3 3 3 3   Language- name 2 objects   0 0 0  Language- repeat 1 1 1 1 1   Language- follow 3 step command   0 3 3  Language- read & follow direction   0 0 0  Write a sentence   0 0 0  Copy design   0 0 0  Total score   22 20 20  10/04/2022    8:56 AM 10/01/2021    8:21 AM  6CIT Screen  What Year? 0 points 0 points  What month? 0 points 0 points  What time? 0 points 0 points  Count back from 20 0 points 0 points  Months in reverse 0 points 0 points  Repeat phrase 0 points 0 points  Total Score 0 points 0 points    Immunizations Immunization History  Administered Date(s) Administered   Fluad Quad(high Dose 65+) 11/09/2021   Hepatitis B, ADULT 03/11/2017, 06/14/2017, 10/18/2017   Influenza Whole 11/23/2011   Influenza, High Dose Seasonal PF 11/03/2014, 11/21/2015, 10/01/2016, 10/20/2018, 11/01/2019, 11/02/2019, 09/26/2020   Influenza-Unspecified 09/30/2017, 11/09/2021   PFIZER Comirnaty(Gray Top)Covid-19 Tri-Sucrose Vaccine 05/23/2020, 11/09/2021   PFIZER(Purple Top)SARS-COV-2 Vaccination 04/02/2019, 04/25/2019, 11/22/2019   PNEUMOCOCCAL CONJUGATE-20 08/07/2020   Pfizer Covid-19 Vaccine Bivalent Booster 55yrs & up 11/28/2020, 07/08/2021   Pneumococcal Conjugate-13 07/10/2015   Pneumococcal Polysaccharide-23 11/21/2015   Respiratory Syncytial Virus Vaccine,Recomb Aduvanted(Arexvy) 12/25/2021   Td 03/23/2002   Tdap 07/10/2015   Zoster Recombinant(Shingrix) 11/18/2017, 02/22/2018   Zoster, Live 07/23/2011    TDAP status: Up to date  Flu Vaccine status: Due, Education has been provided regarding the importance of this vaccine. Advised may receive this vaccine at local pharmacy or Health  Dept. Aware to provide a copy of the vaccination record if obtained from local pharmacy or Health Dept. Verbalized acceptance and understanding.  Pneumococcal vaccine status: Up to date  Covid-19 vaccine status: Information provided on how to obtain vaccines.   Qualifies for Shingles Vaccine? Yes   Zostavax completed Yes   Shingrix Completed?: Yes  Screening Tests Health Maintenance  Topic Date Due   OPHTHALMOLOGY EXAM  07/16/2022   INFLUENZA VACCINE  09/23/2022   Diabetic kidney evaluation - eGFR measurement  10/07/2022   Diabetic kidney evaluation - Urine ACR  10/07/2022   COVID-19 Vaccine (8 - 2023-24 season) 10/21/2022 (Originally 01/04/2022)   HEMOGLOBIN A1C  10/13/2022   FOOT EXAM  04/15/2023   Medicare Annual Wellness (AWV)  10/04/2023   Colonoscopy  01/27/2024   DTaP/Tdap/Td (3 - Td or Tdap) 07/09/2025   Pneumonia Vaccine 9+ Years old  Completed   Hepatitis C Screening  Completed   Zoster Vaccines- Shingrix  Completed   HPV VACCINES  Aged Out    Health Maintenance  Health Maintenance Due  Topic Date Due   OPHTHALMOLOGY EXAM  07/16/2022   INFLUENZA VACCINE  09/23/2022   Diabetic kidney evaluation - eGFR measurement  10/07/2022   Diabetic kidney evaluation - Urine ACR  10/07/2022    Colorectal cancer screening: Type of screening: Colonoscopy. Completed 01/26/22. Repeat every 2 years  Lung Cancer Screening: (Low Dose CT Chest recommended if Age 36-80 years, 20 pack-year currently smoking OR have quit w/in 15years.) does not qualify.   Lung Cancer Screening Referral: no  Additional Screening:  Hepatitis C Screening: does qualify; Completed 06/27/15  Vision Screening: Recommended annual ophthalmology exams for early detection of glaucoma and other disorders of the eye. Is the patient up to date with their annual eye exam?  Yes  Who is the provider or what is the name of the office in which the patient attends annual eye exams? Dr.Scott If pt is not established  with a provider, would they like to be referred to a provider to establish care? Yes .   Dental Screening: Recommended annual dental exams for proper oral hygiene  Diabetic Foot Exam: Diabetic Foot Exam: Completed 04/14/22  Community Resource Referral /  Chronic Care Management: CRR required this visit?  No   CCM required this visit?  No     Plan:     I have personally reviewed and noted the following in the patient's chart:   Medical and social history Use of alcohol, tobacco or illicit drugs  Current medications and supplements including opioid prescriptions. Patient is not currently taking opioid prescriptions. Functional ability and status Nutritional status Physical activity Advanced directives List of other physicians Hospitalizations, surgeries, and ER visits in previous 12 months Vitals Screenings to include cognitive, depression, and falls Referrals and appointments  In addition, I have reviewed and discussed with patient certain preventive protocols, quality metrics, and best practice recommendations. A written personalized care plan for preventive services as well as general preventive health recommendations were provided to patient.     Maryan Puls, LPN   [id number]/[id number]   After Visit Summary: (MyChart) Due to this being a telephonic visit, the after visit summary with patients personalized plan was offered to patient via MyChart   Nurse Notes: Diabetic eye exam results requested.

## 2022-10-05 ENCOUNTER — Encounter: Payer: Self-pay | Admitting: Family Medicine

## 2022-10-08 ENCOUNTER — Other Ambulatory Visit: Payer: Medicare Other

## 2022-10-08 DIAGNOSIS — D751 Secondary polycythemia: Secondary | ICD-10-CM

## 2022-10-08 DIAGNOSIS — Z125 Encounter for screening for malignant neoplasm of prostate: Secondary | ICD-10-CM | POA: Diagnosis not present

## 2022-10-08 DIAGNOSIS — E1169 Type 2 diabetes mellitus with other specified complication: Secondary | ICD-10-CM | POA: Diagnosis not present

## 2022-10-08 DIAGNOSIS — E785 Hyperlipidemia, unspecified: Secondary | ICD-10-CM | POA: Diagnosis not present

## 2022-10-08 DIAGNOSIS — E118 Type 2 diabetes mellitus with unspecified complications: Secondary | ICD-10-CM | POA: Diagnosis not present

## 2022-10-08 LAB — COMPREHENSIVE METABOLIC PANEL
ALT: 24 U/L (ref 0–53)
AST: 15 U/L (ref 0–37)
Albumin: 4.4 g/dL (ref 3.5–5.2)
Alkaline Phosphatase: 65 U/L (ref 39–117)
BUN: 22 mg/dL (ref 6–23)
CO2: 30 mEq/L (ref 19–32)
Calcium: 9.7 mg/dL (ref 8.4–10.5)
Chloride: 97 mEq/L (ref 96–112)
Creatinine, Ser: 0.99 mg/dL (ref 0.40–1.50)
GFR: 75.88 mL/min (ref >60.00)
Glucose, Bld: 164 mg/dL — ABNORMAL HIGH (ref 70–99)
Potassium: 4.6 mEq/L (ref 3.5–5.1)
Sodium: 132 mEq/L — ABNORMAL LOW (ref 135–145)
Total Bilirubin: 0.8 mg/dL (ref 0.2–1.2)
Total Protein: 6.7 g/dL (ref 6.0–8.3)

## 2022-10-08 LAB — CBC WITH DIFFERENTIAL/PLATELET
Basophils Absolute: 0 10*3/uL (ref 0.0–0.1)
Basophils Relative: 0.6 % (ref 0.0–3.0)
Eosinophils Absolute: 0.1 10*3/uL (ref 0.0–0.7)
Eosinophils Relative: 2.8 % (ref 0.0–5.0)
HCT: 49.5 % (ref 39.0–52.0)
Hemoglobin: 16.6 g/dL (ref 13.0–17.0)
Lymphocytes Relative: 35.1 % (ref 12.0–46.0)
Lymphs Abs: 1.9 10*3/uL (ref 0.7–4.0)
MCHC: 33.5 g/dL (ref 30.0–36.0)
MCV: 97 fl (ref 78.0–100.0)
Monocytes Absolute: 0.4 10*3/uL (ref 0.1–1.0)
Monocytes Relative: 8.4 % (ref 3.0–12.0)
Neutro Abs: 2.8 10*3/uL (ref 1.4–7.7)
Neutrophils Relative %: 53.1 % (ref 43.0–77.0)
Platelets: 229 10*3/uL (ref 150.0–400.0)
RBC: 5.11 Mil/uL (ref 4.22–5.81)
RDW: 13.2 % (ref 11.5–15.5)
WBC: 5.3 10*3/uL (ref 4.0–10.5)

## 2022-10-08 LAB — LIPID PANEL
Cholesterol: 113 mg/dL (ref 0–200)
HDL: 50 mg/dL (ref >39.00)
LDL Cholesterol: 48 mg/dL (ref 0–99)
NonHDL: 62.78
Total CHOL/HDL Ratio: 2
Triglycerides: 72 mg/dL (ref 0.0–149.0)
VLDL: 14.4 mg/dL (ref 0.0–40.0)

## 2022-10-08 LAB — MICROALBUMIN / CREATININE URINE RATIO
Creatinine,U: 190.1 mg/dL
Microalb Creat Ratio: 3.8 mg/g (ref 0.0–30.0)
Microalb, Ur: 7.3 mg/dL — ABNORMAL HIGH (ref 0.0–1.9)

## 2022-10-08 LAB — PSA: PSA: 2.98 ng/mL (ref 0.10–4.00)

## 2022-10-08 LAB — VITAMIN B12: Vitamin B-12: 342 pg/mL (ref 211–911)

## 2022-10-08 LAB — HEMOGLOBIN A1C: Hgb A1c MFr Bld: 6.4 % (ref 4.6–6.5)

## 2022-10-14 DIAGNOSIS — H6123 Impacted cerumen, bilateral: Secondary | ICD-10-CM | POA: Diagnosis not present

## 2022-10-14 DIAGNOSIS — H903 Sensorineural hearing loss, bilateral: Secondary | ICD-10-CM | POA: Diagnosis not present

## 2022-10-15 ENCOUNTER — Ambulatory Visit (INDEPENDENT_AMBULATORY_CARE_PROVIDER_SITE_OTHER): Payer: Medicare Other | Admitting: Family Medicine

## 2022-10-15 ENCOUNTER — Encounter: Payer: Self-pay | Admitting: Family Medicine

## 2022-10-15 VITALS — BP 136/72 | HR 94 | Temp 98.0°F | Ht 73.23 in | Wt 324.2 lb

## 2022-10-15 DIAGNOSIS — Z7985 Long-term (current) use of injectable non-insulin antidiabetic drugs: Secondary | ICD-10-CM

## 2022-10-15 DIAGNOSIS — Z0000 Encounter for general adult medical examination without abnormal findings: Secondary | ICD-10-CM

## 2022-10-15 DIAGNOSIS — I10 Essential (primary) hypertension: Secondary | ICD-10-CM

## 2022-10-15 DIAGNOSIS — I2510 Atherosclerotic heart disease of native coronary artery without angina pectoris: Secondary | ICD-10-CM | POA: Diagnosis not present

## 2022-10-15 DIAGNOSIS — E785 Hyperlipidemia, unspecified: Secondary | ICD-10-CM | POA: Diagnosis not present

## 2022-10-15 DIAGNOSIS — E1169 Type 2 diabetes mellitus with other specified complication: Secondary | ICD-10-CM

## 2022-10-15 DIAGNOSIS — I2584 Coronary atherosclerosis due to calcified coronary lesion: Secondary | ICD-10-CM | POA: Diagnosis not present

## 2022-10-15 DIAGNOSIS — Z7189 Other specified counseling: Secondary | ICD-10-CM

## 2022-10-15 DIAGNOSIS — E118 Type 2 diabetes mellitus with unspecified complications: Secondary | ICD-10-CM

## 2022-10-15 DIAGNOSIS — Z7984 Long term (current) use of oral hypoglycemic drugs: Secondary | ICD-10-CM

## 2022-10-15 DIAGNOSIS — E538 Deficiency of other specified B group vitamins: Secondary | ICD-10-CM | POA: Diagnosis not present

## 2022-10-15 MED ORDER — FREESTYLE LIBRE 3 SENSOR MISC: 1.0000 [IU] | 4 refills | Status: DC

## 2022-10-15 MED ORDER — METFORMIN HCL 500 MG PO TABS
500.0000 mg | ORAL_TABLET | Freq: Every day | ORAL | 3 refills | Status: DC
Start: 2022-10-15 — End: 2022-12-07

## 2022-10-15 MED ORDER — TRULICITY 1.5 MG/0.5ML ~~LOC~~ SOAJ: 1.5000 mg | SUBCUTANEOUS | 4 refills | Status: DC

## 2022-10-15 MED ORDER — ATORVASTATIN CALCIUM 20 MG PO TABS
20.0000 mg | ORAL_TABLET | Freq: Every day | ORAL | 3 refills | Status: DC
Start: 2022-10-15 — End: 2022-12-06

## 2022-10-15 MED ORDER — LOSARTAN POTASSIUM 100 MG PO TABS
100.0000 mg | ORAL_TABLET | Freq: Every day | ORAL | 3 refills | Status: DC
Start: 2022-10-15 — End: 2022-12-06

## 2022-10-15 NOTE — Assessment & Plan Note (Signed)
Chronic, stable. Continue current regimen. 

## 2022-10-15 NOTE — Assessment & Plan Note (Signed)
Preventative protocols reviewed and updated unless pt declined. Discussed healthy diet and lifestyle.  

## 2022-10-15 NOTE — Assessment & Plan Note (Addendum)
Continue aspirin, atorvastatin.  Appreciate cardiology care.

## 2022-10-15 NOTE — Assessment & Plan Note (Signed)
Previously discussed.

## 2022-10-15 NOTE — Patient Instructions (Addendum)
Start B complex multivitamin for the next 1-2 months to see if any improvement in R foot symptoms.  You are doing well today Return as needed or in 6 months for diabetes follow up visit

## 2022-10-15 NOTE — Progress Notes (Addendum)
Ph: [phone] Fax: [phone]   Patient ID: Dennis Ayers, male    DOB: February 27, 1950, 72 y.o.   MRN: [id number]  This visit was conducted in person.  BP 136/72   Pulse 94   Temp 98 F (36.7 C) (Temporal)   Ht 6' 1.23" (1.86 m)   Wt (!) 324 lb 3.2 oz (147.1 kg)   SpO2 95%   BMI 42.51 kg/m    CC: CPE Subjective:   HPI: Dennis Ayers is a 72 y.o. male presenting on 10/15/2022 for Annual Exam   Stressful year with wife's medical issues.   Saw health advisor earlier this month for medicare wellness visit. Note reviewed.    No results found.  Flowsheet Row Office Visit from 10/15/2022 in [facility]  PHQ-2 Total Score 0          10/15/2022    8:00 AM 10/01/2022    9:02 AM 04/14/2022    8:09 AM 10/01/2021    8:17 AM 09/28/2021   11:23 PM  Fall Risk   Falls in the past year? 0 0 1 0 0  Number falls in past yr: 0 0 0 0   Injury with Fall? 0 0 0 0 0  Risk for fall due to : No Fall Risks No Fall Risks  No Fall Risks   Follow up Falls evaluation completed Falls prevention discussed;Falls evaluation completed  Falls evaluation completed    DM - continues Trulicity 1.5mg  weekly, metformin 500mg  daily. Using Freestyle Libre 3 CGM. Requests foot exam today CGM data: Freestyle Libre 30 day average sugar: 156, time in range 91%, hypoglycemia 0%, stage 1 hyperglycemia 9%, stage 2 hyperglycemia 0%.    Preventative: COLONOSCOPY Date: 06/2015 mult TAs, severe diverticulosis, 1 large 2.5cm cecal polyp pending resection Leone Payor) - 08/2015 large TA, rpt 1 yr Leone Payor) COLONOSCOPY WITH PROPOFOL 10/29/2016 TA, rpt 1 yr Leone Payor, Maryjean Morn, MD) COLONOSCOPY 11/2017 - 7 TAs, 3 SSPs, diverticulosis, rpt 2 yrs Leone Payor) Colonoscopy 12/2019 - multiple TA and SSP, diverticulosis with excess colon looping, rpt 1 year ([facility])  Colonoscopy 01/2021 - diverticulosis, incomplete, suspect polyposis syndrome, rpt 1 yr in hospital Leone Payor).  COLONOSCOPY 01/2022 -  TA, diverticulosis, rpt 2 yrs Leone Payor)  Prostate cancer screening - discussed. Continue yearly PSA screening. Requests DRE Lung cancer screening - never smoker  AAA screen - never smoker, no fmhx AAA  [facility]  COVID vaccine - Pfizer 03/2019, 04/2019, booster 10/2019, 05/2020, bivalent 11/2020, 06/2021  Td 2004, Tdap 06/2015  Prevnar-13 06/2015, pneumovax 10/2015. Prevnar-20 07/2020 Zostavax - 2013 RSV - 12/2021 Shingrix - 10/2017, 02/2018 Hep B - completed 2019 Advanced directive discussion - has at home. Copy in chart 08/2015. HCPOA is wife and son. No prolonged life support if terminal illness.  Seat belt use discussed  Sunscreen use discussed, no changing moles on skin. No dermatologist  Non smoker  Alcohol - none Dentist - Q6 mo Eye exam - yearly  Bowel - no constipation Bladder - no incontinence    Caffeine: 2-3 diet sodas/day Lives with wife 1 grown son. Occupation: Self-employed; Administrator Activity: some golf, limited by knee pain  Diet: good water, daily vegetables      Relevant past medical, surgical, family and social history reviewed and updated as indicated. Interim medical history since our last visit reviewed. Allergies and medications reviewed and updated. Outpatient Medications Prior to Visit  Medication Sig Dispense Refill  acetaminophen (TYLENOL) 500 MG tablet Take 1,000 mg by mouth every 6 (six) hours as needed for moderate pain or mild pain.     aspirin EC 81 MG tablet Take 81 mg by mouth daily.     doxylamine, Sleep, (UNISOM) 25 MG tablet Take 25 mg by mouth at bedtime.     glucose blood (ONETOUCH ULTRA) test strip Check blood sugar once a day 100 strip 3   Krill Oil 300 MG CAPS Take 300 mg by mouth daily.     Misc Natural Products (OSTEO BI-FLEX JOINT SHIELD PO) Take 1 capsule by mouth daily.      Multiple Vitamin (MULTIVITAMIN) tablet Take 1 tablet by mouth daily.     OneTouch Delica Lancets 33G MISC Use as directed  to check blood sugar once daily as needed.  Dx code: E11.9 100 each 3   atorvastatin (LIPITOR) 20 MG tablet TAKE 1 TABLET BY MOUTH DAILY 100 tablet 1   Continuous Blood Gluc Sensor (FREESTYLE LIBRE 3 SENSOR) MISC 1 Units by Does not apply route as directed. E11.8. Place 1 sensor on the skin every 14 days. Use to check glucose continuously 6 each 3   Dulaglutide (TRULICITY) 1.5 MG/0.5ML SOPN Inject 1.5 mg into the skin once a week. 2 mL 11   losartan (COZAAR) 100 MG tablet TAKE 1 TABLET BY MOUTH DAILY 100 tablet 1   metFORMIN (GLUCOPHAGE) 500 MG tablet TAKE 1 TABLET BY MOUTH DAILY  WITH BREAKFAST 100 tablet 0   No facility-administered medications prior to visit.     Per HPI unless specifically indicated in ROS section below Review of Systems  Constitutional:  Negative for activity change, appetite change, chills, fatigue, fever and unexpected weight change.  HENT:  Negative for hearing loss.   Eyes:  Negative for visual disturbance.  Respiratory:  Negative for cough, chest tightness, shortness of breath and wheezing.   Cardiovascular:  Negative for chest pain, palpitations and leg swelling.  Gastrointestinal:  Negative for abdominal distention, abdominal pain, blood in stool, constipation, diarrhea, nausea and vomiting.  Genitourinary:  Negative for difficulty urinating and hematuria.  Musculoskeletal:  Negative for arthralgias, myalgias and neck pain.  Skin:  Negative for rash.  Neurological:  Negative for dizziness, seizures, syncope and headaches.  Hematological:  Negative for adenopathy. Does not bruise/bleed easily.  Psychiatric/Behavioral:  Negative for dysphoric mood. The patient is not nervous/anxious.     Objective:  BP 136/72   Pulse 94   Temp 98 F (36.7 C) (Temporal)   Ht 6' 1.23" (1.86 m)   Wt (!) 324 lb 3.2 oz (147.1 kg)   SpO2 95%   BMI 42.51 kg/m   Wt Readings from Last 3 Encounters:  10/15/22 (!) 324 lb 3.2 oz (147.1 kg)  10/04/22 (!) 321 lb (145.6 kg)   09/16/22 (!) 321 lb 9.6 oz (145.9 kg)      Physical Exam Vitals and nursing note reviewed.  Constitutional:      General: He is not in acute distress.    Appearance: Normal appearance. He is well-developed. He is not ill-appearing.  HENT:     Head: Normocephalic and atraumatic.     Right Ear: Hearing, tympanic membrane, ear canal and external ear normal.     Left Ear: Hearing, tympanic membrane, ear canal and external ear normal.     Nose: Nose normal.     Mouth/Throat:     Mouth: Mucous membranes are moist.     Pharynx: Oropharynx is clear. No oropharyngeal exudate  or posterior oropharyngeal erythema.  Eyes:     General: No scleral icterus.    Extraocular Movements: Extraocular movements intact.     Conjunctiva/sclera: Conjunctivae normal.     Pupils: Pupils are equal, round, and reactive to light.  Neck:     Thyroid: No thyroid mass or thyromegaly.     Vascular: No carotid bruit.  Cardiovascular:     Rate and Rhythm: Normal rate and regular rhythm. Occasional Extrasystoles are present.    Pulses: Normal pulses.          Radial pulses are 2+ on the right side and 2+ on the left side.     Heart sounds: Normal heart sounds. No murmur heard. Pulmonary:     Effort: Pulmonary effort is normal. No respiratory distress.     Breath sounds: Normal breath sounds. No wheezing, rhonchi or rales.  Abdominal:     General: Bowel sounds are normal. There is no distension.     Palpations: Abdomen is soft. There is no mass.     Tenderness: There is no abdominal tenderness. There is no guarding or rebound.     Hernia: No hernia is present.  Genitourinary:    Prostate: Enlarged (moderate symmetric). Not tender and no nodules present.     Rectum: Normal. No mass, tenderness, anal fissure, external hemorrhoid or internal hemorrhoid. Normal anal tone.  Musculoskeletal:        General: Normal range of motion.     Cervical back: Normal range of motion and neck supple.     Right lower leg: No  edema.     Left lower leg: No edema.     Comments:  R foot - no obvious callus, abrasion/cut, corn or swelling to sole of R foot   Lymphadenopathy:     Cervical: No cervical adenopathy.  Skin:    General: Skin is warm and dry.     Findings: No rash.  Neurological:     General: No focal deficit present.     Mental Status: He is alert and oriented to person, place, and time.  Psychiatric:        Mood and Affect: Mood normal.        Behavior: Behavior normal.        Thought Content: Thought content normal.        Judgment: Judgment normal.       Results for orders placed or performed in visit on 10/08/22  Vitamin B12  Result Value Ref Range   Vitamin B-12 342 211 - 911 pg/mL  CBC with Differential/Platelet  Result Value Ref Range   WBC 5.3 4.0 - 10.5 K/uL   RBC 5.11 4.22 - 5.81 Mil/uL   Hemoglobin 16.6 13.0 - 17.0 g/dL   HCT 19.1 47.8 - 29.5 %   MCV 97.0 78.0 - 100.0 fl   MCHC 33.5 30.0 - 36.0 g/dL   RDW 62.1 30.8 - 65.7 %   Platelets 229.0 150.0 - 400.0 K/uL   Neutrophils Relative % 53.1 43.0 - 77.0 %   Lymphocytes Relative 35.1 12.0 - 46.0 %   Monocytes Relative 8.4 3.0 - 12.0 %   Eosinophils Relative 2.8 0.0 - 5.0 %   Basophils Relative 0.6 0.0 - 3.0 %   Neutro Abs 2.8 1.4 - 7.7 K/uL   Lymphs Abs 1.9 0.7 - 4.0 K/uL   Monocytes Absolute 0.4 0.1 - 1.0 K/uL   Eosinophils Absolute 0.1 0.0 - 0.7 K/uL   Basophils Absolute 0.0 0.0 - 0.1 K/uL  PSA  Result Value Ref Range   PSA 2.98 0.10 - 4.00 ng/mL  Comprehensive metabolic panel  Result Value Ref Range   Sodium 132 (L) 135 - 145 mEq/L   Potassium 4.6 3.5 - 5.1 mEq/L   Chloride 97 96 - 112 mEq/L   CO2 30 19 - 32 mEq/L   Glucose, Bld 164 (H) 70 - 99 mg/dL   BUN 22 6 - 23 mg/dL   Creatinine, Ser 4.13 0.40 - 1.50 mg/dL   Total Bilirubin 0.8 0.2 - 1.2 mg/dL   Alkaline Phosphatase 65 39 - 117 U/L   AST 15 0 - 37 U/L   ALT 24 0 - 53 U/L   Total Protein 6.7 6.0 - 8.3 g/dL   Albumin 4.4 3.5 - 5.2 g/dL   GFR 24.40 >10.27  mL/min   Calcium 9.7 8.4 - 10.5 mg/dL  Lipid panel  Result Value Ref Range   Cholesterol 113 0 - 200 mg/dL   Triglycerides 25.3 0.0 - 149.0 mg/dL   HDL 66.44 >03.47 mg/dL   VLDL 42.5 0.0 - 95.6 mg/dL   LDL Cholesterol 48 0 - 99 mg/dL   Total CHOL/HDL Ratio 2    NonHDL 62.78   Microalbumin / creatinine urine ratio  Result Value Ref Range   Microalb, Ur 7.3 (H) 0.0 - 1.9 mg/dL   Creatinine,U 387.5 mg/dL   Microalb Creat Ratio 3.8 0.0 - 30.0 mg/g  Hemoglobin A1c  Result Value Ref Range   Hgb A1c MFr Bld 6.4 4.6 - 6.5 %    Assessment & Plan:   Problem List Items Addressed This Visit     Health maintenance examination - Primary (Chronic)    Preventative protocols reviewed and updated unless pt declined. Discussed healthy diet and lifestyle.       Advanced care planning/counseling discussion (Chronic)    Previously discussed.      Obesity, morbid, BMI 40.0-49.9 (HCC)    Encourage healthy diet and lifestyle choices to affect sustainable weight loss.       Relevant Medications   Dulaglutide (TRULICITY) 1.5 MG/0.5ML SOPN   metFORMIN (GLUCOPHAGE) 500 MG tablet   Essential hypertension    Chronic, stable. Continue current regimen.       Relevant Medications   atorvastatin (LIPITOR) 20 MG tablet   losartan (COZAAR) 100 MG tablet   Controlled diabetes mellitus type 2 with complications (HCC)    Chronic, stable. Continue current regimen.       Relevant Medications   atorvastatin (LIPITOR) 20 MG tablet   Dulaglutide (TRULICITY) 1.5 MG/0.5ML SOPN   losartan (COZAAR) 100 MG tablet   metFORMIN (GLUCOPHAGE) 500 MG tablet   Coronary artery calcification    Continue aspirin, atorvastatin.  Appreciate cardiology care.       Relevant Medications   atorvastatin (LIPITOR) 20 MG tablet   losartan (COZAAR) 100 MG tablet   Hyperlipidemia associated with type 2 diabetes mellitus (HCC) - goal LDL <70    Chronic, great control on atorvastatin  and krill oil. The ASCVD Risk score  (Arnett DK, et al., 2019) failed to calculate for the following reasons:   The valid total cholesterol range is 130 to 320 mg/dL       Relevant Medications   atorvastatin (LIPITOR) 20 MG tablet   Dulaglutide (TRULICITY) 1.5 MG/0.5ML SOPN   losartan (COZAAR) 100 MG tablet   metFORMIN (GLUCOPHAGE) 500 MG tablet   Low serum vitamin B12    Recommend trial of B complex multivitamin x 1-2 months and  reassess R foot symptoms.         Meds ordered this encounter  Medications   atorvastatin (LIPITOR) 20 MG tablet    Sig: Take 1 tablet (20 mg total) by mouth daily.    Dispense:  100 tablet    Refill:  3   Continuous Glucose Sensor (FREESTYLE LIBRE 3 SENSOR) MISC    Sig: 1 Units by Does not apply route as directed. E11.8. Place 1 sensor on the skin every 14 days. Use to check glucose continuously    Dispense:  6 each    Refill:  4   Dulaglutide (TRULICITY) 1.5 MG/0.5ML SOPN    Sig: Inject 1.5 mg into the skin once a week.    Dispense:  6 mL    Refill:  4   losartan (COZAAR) 100 MG tablet    Sig: Take 1 tablet (100 mg total) by mouth daily.    Dispense:  100 tablet    Refill:  3   metFORMIN (GLUCOPHAGE) 500 MG tablet    Sig: Take 1 tablet (500 mg total) by mouth daily with breakfast.    Dispense:  100 tablet    Refill:  3    No orders of the defined types were placed in this encounter.   Patient Instructions  Start B complex multivitamin for the next 1-2 months to see if any improvement in R foot symptoms.  You are doing well today Return as needed or in 6 months for diabetes follow up visit   Follow up plan: Return in about 6 months (around 04/17/2023) for follow up visit.  Eustaquio Boyden, MD

## 2022-10-15 NOTE — Assessment & Plan Note (Signed)
Chronic, great control on atorvastatin  and krill oil. The ASCVD Risk score (Arnett DK, et al., 2019) failed to calculate for the following reasons:   The valid total cholesterol range is 130 to 320 mg/dL

## 2022-10-15 NOTE — Assessment & Plan Note (Signed)
Encourage healthy diet and lifestyle choices to affect sustainable weight loss.  

## 2022-10-15 NOTE — Assessment & Plan Note (Signed)
Recommend trial of B complex multivitamin x 1-2 months and reassess R foot symptoms.

## 2022-11-09 DIAGNOSIS — Z1283 Encounter for screening for malignant neoplasm of skin: Secondary | ICD-10-CM | POA: Diagnosis not present

## 2022-11-09 DIAGNOSIS — D225 Melanocytic nevi of trunk: Secondary | ICD-10-CM | POA: Diagnosis not present

## 2022-11-09 DIAGNOSIS — X32XXXA Exposure to sunlight, initial encounter: Secondary | ICD-10-CM | POA: Diagnosis not present

## 2022-11-09 DIAGNOSIS — D485 Neoplasm of uncertain behavior of skin: Secondary | ICD-10-CM | POA: Diagnosis not present

## 2022-11-09 DIAGNOSIS — L570 Actinic keratosis: Secondary | ICD-10-CM | POA: Diagnosis not present

## 2022-11-11 ENCOUNTER — Encounter: Payer: Self-pay | Admitting: Family Medicine

## 2022-11-11 ENCOUNTER — Telehealth: Payer: Self-pay | Admitting: Family Medicine

## 2022-11-11 MED ORDER — FREESTYLE LIBRE 3 PLUS SENSOR MISC: 3 refills | Status: DC

## 2022-11-11 NOTE — Telephone Encounter (Signed)
Plz notify I sent 3 mo supply to his pharmacy.

## 2022-11-11 NOTE — Telephone Encounter (Signed)
Patient stated that pharmacy doesn't carry Continuous Glucose Sensor (FREESTYLE LIBRE 3 SENSOR) MISC . They have freestyle libre 3 plus now.He would like to know if new script can be sent in for him ?  [id number], [place] - [address] Phone: [phone]  Fax: [phone]

## 2022-11-12 NOTE — Telephone Encounter (Signed)
Spoke with pt relaying Dr. Timoteo Expose message. Pt expresses his thanks.

## 2022-12-01 ENCOUNTER — Other Ambulatory Visit (HOSPITAL_COMMUNITY): Payer: Self-pay

## 2022-12-01 ENCOUNTER — Telehealth: Payer: Self-pay

## 2022-12-01 NOTE — Telephone Encounter (Signed)
Pharmacy Patient Advocate Encounter   Received notification from CoverMyMeds that prior authorization for [facility] 3 plus sensor is required/requested.   Insurance verification completed.   The patient is insured through [facility] .   Per test claim: PA required; PA submitted to [facility] via CoverMyMeds Key/confirmation #/EOC [id number] Status is pending

## 2022-12-03 ENCOUNTER — Other Ambulatory Visit: Payer: Self-pay | Admitting: Family Medicine

## 2022-12-03 DIAGNOSIS — I10 Essential (primary) hypertension: Secondary | ICD-10-CM

## 2022-12-03 DIAGNOSIS — E1169 Type 2 diabetes mellitus with other specified complication: Secondary | ICD-10-CM

## 2022-12-06 NOTE — Telephone Encounter (Signed)
Faxed additional information to [phone].

## 2022-12-07 ENCOUNTER — Other Ambulatory Visit: Payer: Self-pay

## 2022-12-07 ENCOUNTER — Encounter: Payer: Self-pay | Admitting: Family Medicine

## 2022-12-07 DIAGNOSIS — E118 Type 2 diabetes mellitus with unspecified complications: Secondary | ICD-10-CM

## 2022-12-07 MED ORDER — METFORMIN HCL 500 MG PO TABS
500.0000 mg | ORAL_TABLET | Freq: Every day | ORAL | 3 refills | Status: DC
Start: 2022-12-07 — End: 2023-10-17

## 2022-12-09 NOTE — Telephone Encounter (Signed)
Pharmacy Patient Advocate Encounter  Received notification from [facility] that Prior Authorization for Freestyle Libre 3 Plus sensor has been DENIED.  See denial reason below. No denial letter attached in CMM. Will attache denial letter to Media tab once received.   PA #/Case ID/Reference #:  [id number]

## 2022-12-10 NOTE — Telephone Encounter (Signed)
Spoke with pt relaying Dr Timoteo Expose message. Pt verbalizes understanding and states he will continue to pay out of pocket but doesn't need any refills at this time.

## 2022-12-10 NOTE — Telephone Encounter (Addendum)
Plz notify patient. I believe he's been paying out of pocket for continuous glucose monitors. He may continue doing this if desired.  To let us know if needs a refill.

## 2022-12-28 ENCOUNTER — Encounter: Payer: Self-pay | Admitting: Family Medicine

## 2023-01-29 ENCOUNTER — Encounter: Payer: Self-pay | Admitting: Family Medicine

## 2023-01-31 MED ORDER — FREESTYLE LIBRE 3 SENSOR MISC: 3 refills | Status: DC

## 2023-02-01 DIAGNOSIS — D225 Melanocytic nevi of trunk: Secondary | ICD-10-CM | POA: Diagnosis not present

## 2023-02-01 DIAGNOSIS — Z1283 Encounter for screening for malignant neoplasm of skin: Secondary | ICD-10-CM | POA: Diagnosis not present

## 2023-04-18 ENCOUNTER — Encounter: Payer: Self-pay | Admitting: Family Medicine

## 2023-04-18 ENCOUNTER — Ambulatory Visit (INDEPENDENT_AMBULATORY_CARE_PROVIDER_SITE_OTHER): Payer: Medicare Other | Admitting: Family Medicine

## 2023-04-18 VITALS — BP 138/70 | HR 96 | Temp 97.7°F | Ht 73.25 in | Wt 325.4 lb

## 2023-04-18 DIAGNOSIS — E118 Type 2 diabetes mellitus with unspecified complications: Secondary | ICD-10-CM

## 2023-04-18 DIAGNOSIS — R233 Spontaneous ecchymoses: Secondary | ICD-10-CM | POA: Diagnosis not present

## 2023-04-18 DIAGNOSIS — Z794 Long term (current) use of insulin: Secondary | ICD-10-CM

## 2023-04-18 DIAGNOSIS — E1169 Type 2 diabetes mellitus with other specified complication: Secondary | ICD-10-CM

## 2023-04-18 LAB — POCT GLYCOSYLATED HEMOGLOBIN (HGB A1C): Hemoglobin A1C: 6.4 % — AB (ref 4.0–5.6)

## 2023-04-18 NOTE — Assessment & Plan Note (Addendum)
 To bilateral lower extremities at area where copper compression socks lay.  Recent plt normal. Will continue to monitor.

## 2023-04-18 NOTE — Assessment & Plan Note (Addendum)
 Chronic, well controlled - continue current regimen.  May be interested in titrating Trulicity this summer.  Notes he will need to transition to [facility] 3 plus later this year Diminished pedal pluses noted today without claudication or rest pain - will monitor.

## 2023-04-18 NOTE — Patient Instructions (Signed)
 Continue current medicines Return in 6 months for physical Good to see you today!

## 2023-04-18 NOTE — Progress Notes (Signed)
 Ph: [phone] Fax: [phone]   Patient ID: Dennis Ayers, male    DOB: [birth year], 73 y.o.   MRN: [id number]  This visit was conducted in person.  BP 138/70   Pulse 96   Temp 97.7 F (36.5 C) (Oral)   Ht 6' 1.25" (1.861 m)   Wt (!) 325 lb 6 oz (147.6 kg)   SpO2 94%   BMI 42.64 kg/m    CC: DM f/u visit  Subjective:   HPI: Dennis Ayers is a 73 y.o. male presenting on 04/18/2023 for Medical Management of Chronic Issues (Here for 6 mo DM f/u.)   Fall 2 wks ago - hit both knees on concrete. Slowly recovering.   DM - does regularly check sugars with CGM. Compliant with antihyperglycemic regimen which includes: metformin 500gm daily, trulicity 1.5mg  weekly. Denies low sugars or hypoglycemic symptoms. Denies paresthesias, blurry vision. Last diabetic eye exam 06/2022. Glucometer brand: Freestyle Libre 3. Last foot exam: DUE. DSME: [facility]. CGM data: Freestyle Libre 3 (not plus) 30 day average sugar: 154, time in range 90%, hypoglycemia 0%, stage 1 hyperglycemia 10%, stage 2 hyperglycemia 0%.   Lab Results  Component Value Date   HGBA1C 6.4 (A) 04/18/2023   Diabetic Foot Exam - Simple   Simple Foot Form Diabetic Foot exam was performed with the following findings: Yes 04/18/2023  8:16 AM  Visual Inspection No deformities, no ulcerations, no other skin breakdown bilaterally: Yes Sensation Testing Intact to touch and monofilament testing bilaterally: Yes Pulse Check See comments: Yes Comments No claudication Diminished DP bilaterally    Lab Results  Component Value Date   MICROALBUR 7.3 (H) 10/08/2022         Relevant past medical, surgical, family and social history reviewed and updated as indicated. Interim medical history since our last visit reviewed. Allergies and medications reviewed and updated. Outpatient Medications Prior to Visit  Medication Sig Dispense Refill   acetaminophen (TYLENOL) 500 MG tablet Take 1,000 mg by mouth every 6 (six) hours as  needed for moderate pain or mild pain.     aspirin EC 81 MG tablet Take 81 mg by mouth daily.     atorvastatin (LIPITOR) 20 MG tablet TAKE 1 TABLET BY MOUTH ONCE  DAILY 100 tablet 2   Continuous Glucose Sensor (FREESTYLE LIBRE 3 SENSOR) MISC Place 1 sensor on the skin every 14 days. Use to check glucose continuously 6 each 3   doxylamine, Sleep, (UNISOM) 25 MG tablet Take 25 mg by mouth at bedtime.     Dulaglutide (TRULICITY) 1.5 MG/0.5ML SOPN Inject 1.5 mg into the skin once a week. 6 mL 4   glucose blood (ONETOUCH ULTRA) test strip Check blood sugar once a day 100 strip 3   Krill Oil 300 MG CAPS Take 300 mg by mouth daily.     losartan (COZAAR) 100 MG tablet TAKE 1 TABLET BY MOUTH DAILY 100 tablet 2   metFORMIN (GLUCOPHAGE) 500 MG tablet Take 1 tablet (500 mg total) by mouth daily with breakfast. 100 tablet 3   Misc Natural Products (OSTEO BI-FLEX JOINT SHIELD PO) Take 1 capsule by mouth daily.      Multiple Vitamin (MULTIVITAMIN) tablet Take 1 tablet by mouth daily.     OneTouch Delica Lancets 33G MISC Use as directed to check blood sugar once daily as needed.  Dx code: E11.9 100 each 3   No facility-administered medications prior to visit.     Per HPI unless specifically indicated in ROS section  below Review of Systems  Objective:  BP 138/70   Pulse 96   Temp 97.7 F (36.5 C) (Oral)   Ht 6' 1.25" (1.861 m)   Wt (!) 325 lb 6 oz (147.6 kg)   SpO2 94%   BMI 42.64 kg/m   Wt Readings from Last 3 Encounters:  04/18/23 (!) 325 lb 6 oz (147.6 kg)  10/15/22 (!) 324 lb 3.2 oz (147.1 kg)  10/04/22 (!) 321 lb (145.6 kg)      Physical Exam Vitals and nursing note reviewed.  Constitutional:      Appearance: Normal appearance. He is not ill-appearing.  HENT:     Head: Normocephalic and atraumatic.  Eyes:     Extraocular Movements: Extraocular movements intact.     Conjunctiva/sclera: Conjunctivae normal.     Pupils: Pupils are equal, round, and reactive to light.  Cardiovascular:      Rate and Rhythm: Normal rate and regular rhythm.     Pulses: Normal pulses.     Heart sounds: Normal heart sounds. No murmur heard. Pulmonary:     Effort: Pulmonary effort is normal. No respiratory distress.     Breath sounds: Normal breath sounds. No wheezing, rhonchi or rales.  Musculoskeletal:     Right lower leg: No edema.     Left lower leg: No edema.     Comments: See HPI for foot exam if done  Skin:    General: Skin is warm and dry.     Capillary Refill: Capillary refill takes 2 to 3 seconds. Bilateral feet    Findings: Rash (petechial to BLE) present.  Neurological:     Mental Status: He is alert.  Psychiatric:        Mood and Affect: Mood normal.        Behavior: Behavior normal.       Results for orders placed or performed in visit on 04/18/23  POCT glycosylated hemoglobin (Hb A1C)   Collection Time: 04/18/23  8:03 AM  Result Value Ref Range   Hemoglobin A1C 6.4 (A) 4.0 - 5.6 %   HbA1c POC (<> result, manual entry)     HbA1c, POC (prediabetic range)     HbA1c, POC (controlled diabetic range)      Assessment & Plan:   Problem List Items Addressed This Visit     Type 2 diabetes mellitus with other specified complication (HCC) - Primary   Chronic, well controlled - continue current regimen.  May be interested in titrating Trulicity this summer.  Notes he will need to transition to [facility] 3 plus later this year Diminished pedal pluses noted today without claudication or rest pain - will monitor.       Relevant Orders   POCT glycosylated hemoglobin (Hb A1C) (Completed)   Petechial rash   To bilateral lower extremities at area where copper compression socks lay.  Recent plt normal. Will continue to monitor.         No orders of the defined types were placed in this encounter.   Orders Placed This Encounter  Procedures   POCT glycosylated hemoglobin (Hb A1C)    Patient Instructions  Continue current medicines Return in 6 months for  physical Good to see you today!  Follow up plan: Return in about 6 months (around 10/16/2023) for annual exam, prior fasting for blood work, medicare wellness visit.  Eustaquio Boyden, MD

## 2023-05-04 DIAGNOSIS — H903 Sensorineural hearing loss, bilateral: Secondary | ICD-10-CM | POA: Diagnosis not present

## 2023-05-04 DIAGNOSIS — H6123 Impacted cerumen, bilateral: Secondary | ICD-10-CM | POA: Diagnosis not present

## 2023-05-24 DIAGNOSIS — Z1283 Encounter for screening for malignant neoplasm of skin: Secondary | ICD-10-CM | POA: Diagnosis not present

## 2023-05-24 DIAGNOSIS — D225 Melanocytic nevi of trunk: Secondary | ICD-10-CM | POA: Diagnosis not present

## 2023-05-24 DIAGNOSIS — I872 Venous insufficiency (chronic) (peripheral): Secondary | ICD-10-CM | POA: Diagnosis not present

## 2023-06-24 ENCOUNTER — Encounter: Payer: Self-pay | Admitting: Family Medicine

## 2023-06-24 MED ORDER — FREESTYLE LIBRE 3 PLUS SENSOR MISC: 3 refills | Status: AC

## 2023-07-19 DIAGNOSIS — H524 Presbyopia: Secondary | ICD-10-CM | POA: Diagnosis not present

## 2023-07-19 LAB — HM DIABETES EYE EXAM

## 2023-08-12 ENCOUNTER — Other Ambulatory Visit: Payer: Self-pay | Admitting: Family Medicine

## 2023-08-12 DIAGNOSIS — E1169 Type 2 diabetes mellitus with other specified complication: Secondary | ICD-10-CM

## 2023-08-12 DIAGNOSIS — I10 Essential (primary) hypertension: Secondary | ICD-10-CM

## 2023-08-15 NOTE — Telephone Encounter (Signed)
 ERx

## 2023-09-28 ENCOUNTER — Encounter: Payer: Self-pay | Admitting: Internal Medicine

## 2023-09-28 ENCOUNTER — Ambulatory Visit: Attending: Internal Medicine | Admitting: Internal Medicine

## 2023-09-28 VITALS — BP 108/68 | HR 87 | Ht 74.0 in | Wt 320.0 lb

## 2023-09-28 DIAGNOSIS — E785 Hyperlipidemia, unspecified: Secondary | ICD-10-CM | POA: Diagnosis not present

## 2023-09-28 DIAGNOSIS — I10 Essential (primary) hypertension: Secondary | ICD-10-CM

## 2023-09-28 DIAGNOSIS — I2510 Atherosclerotic heart disease of native coronary artery without angina pectoris: Secondary | ICD-10-CM

## 2023-09-28 DIAGNOSIS — I77810 Thoracic aortic ectasia: Secondary | ICD-10-CM | POA: Diagnosis not present

## 2023-09-28 DIAGNOSIS — E1169 Type 2 diabetes mellitus with other specified complication: Secondary | ICD-10-CM | POA: Diagnosis not present

## 2023-09-28 NOTE — Patient Instructions (Signed)
 Medication Instructions:  Your physician recommends that you continue on your current medications as directed. Please refer to the Current Medication list given to you today.    *If you need a refill on your cardiac medications before your next appointment, please call your pharmacy*  Lab Work: No labs ordered today    Testing/Procedures: CT Angiography (CTA) chest/aorta, is a special type of CT scan that uses a computer to produce multi-dimensional views of major blood vessels throughout the body. In CT angiography, a contrast material is injected through an IV to help visualize the blood vessels  Nothing to eat or drink 4 hours prior to test  [facility] [address]  [place], [place] [address]   Follow-Up: At [facility], you and your health needs are our priority.  As part of our continuing mission to provide you with exceptional heart care, our providers are all part of one team.  This team includes your primary Cardiologist (physician) and Advanced Practice Providers or APPs (Physician Assistants and Nurse Practitioners) who all work together to provide you with the care you need, when you need it.  Your next appointment:   1 year(s)  Provider:   You may see Lonni Hanson, MD or one of the following Advanced Practice Providers on your designated Care Team:   Lonni Meager, NP Lesley Maffucci, PA-C Bernardino Bring, PA-C Cadence [place], PA-C Tylene Lunch, NP Barnie Hila, NP

## 2023-09-28 NOTE — Progress Notes (Unsigned)
  Cardiology Office Note:  .   Date:  09/28/2023  ID:  Ewald, Beg 1950/07/14, MRN [id number] PCP: Rilla Baller, MD  [facility] Providers Cardiologist:  Lonni Hanson, MD { Click to update primary MD,subspecialty MD or APP then REFRESH:1}    History of Present Illness: .   Dennis Ayers is a 73 y.o. male with history of coronary artery calcification, borderline dilated aortic root (aortic root 4.0 cm by echo, ascending aorta 3.6 cm by unenhanced CT in 2018), type 2 diabetes mellitus, morbid obesity, obstructive sleep apnea, and degenerative joint disease, who presents for follow-up of coronary artery disease.  I last saw him in 08/2022, at which time he was feeling well though he was under quite a bit of stress due to his wife's health issues.  Wife having memory issues.  Lots of associated worry.  No chest pain or shortness of breath.  Mild occ dependent edema.  No palpitations or LH.  Very sweaty working outside in heat.  ROS: See HPI  Studies Reviewed: SABRA   EKG Interpretation Date/Time:  Wednesday September 28 2023 15:47:31 EDT Ventricular Rate:  87 PR Interval:  184 QRS Duration:  94 QT Interval:  372 QTC Calculation: 447 R Axis:   -45  Text Interpretation: Sinus rhythm with Premature atrial complexes Left axis deviation Abnormal ECG When compared with ECG of 16-Sep-2022 08:11, No significant change was found Confirmed by Sanja Elizardo, Lonni [phone]) on 09/28/2023 3:53:10 PM    *** Risk Assessment/Calculations:   {Does this patient have ATRIAL FIBRILLATION?:[id number]}         Physical Exam:   VS:  BP 108/68 (BP Location: Right Arm, Patient Position: Sitting, Cuff Size: Large)   Pulse 87   Ht 6' 2 (1.88 m)   Wt (!) 320 lb (145.2 kg)   SpO2 96%   BMI 41.09 kg/m    Wt Readings from Last 3 Encounters:  09/28/23 (!) 320 lb (145.2 kg)  04/18/23 (!) 325 lb 6 oz (147.6 kg)  10/15/22 (!) 324 lb 3.2 oz (147.1 kg)    General:  NAD. Neck: No JVD or HJR. Lungs:  Clear to auscultation bilaterally without wheezes or crackles. Heart: Regular rate and rhythm without murmurs, rubs, or gallops. Abdomen: Soft, nontender, nondistended. Extremities: No lower extremity edema.  ASSESSMENT AND PLAN: .    ***    {Are you ordering a CV Procedure (e.g. stress test, cath, DCCV, TEE, etc)?   Press F2        :[id number]}  Dispo: ***  Signed, Lonni Hanson, MD

## 2023-09-29 ENCOUNTER — Encounter: Payer: Self-pay | Admitting: Internal Medicine

## 2023-10-03 ENCOUNTER — Telehealth: Payer: Self-pay

## 2023-10-03 DIAGNOSIS — Z01812 Encounter for preprocedural laboratory examination: Secondary | ICD-10-CM

## 2023-10-03 NOTE — Telephone Encounter (Signed)
 Attempted to contact to make him aware he needs a BMP prior to CTA. Left message to call back.

## 2023-10-04 NOTE — Telephone Encounter (Signed)
 Pt made aware and verbalized understanding.

## 2023-10-05 DIAGNOSIS — Z01812 Encounter for preprocedural laboratory examination: Secondary | ICD-10-CM | POA: Diagnosis not present

## 2023-10-06 ENCOUNTER — Ambulatory Visit (INDEPENDENT_AMBULATORY_CARE_PROVIDER_SITE_OTHER): Payer: Medicare Other

## 2023-10-06 VITALS — Ht 74.0 in | Wt 320.0 lb

## 2023-10-06 DIAGNOSIS — Z0000 Encounter for general adult medical examination without abnormal findings: Secondary | ICD-10-CM

## 2023-10-06 LAB — BASIC METABOLIC PANEL WITH GFR
BUN/Creatinine Ratio: 24 (ref 10–24)
BUN: 23 mg/dL (ref 8–27)
CO2: 22 mmol/L (ref 20–29)
Calcium: 9.3 mg/dL (ref 8.6–10.2)
Chloride: 103 mmol/L (ref 96–106)
Creatinine, Ser: 0.96 mg/dL (ref 0.76–1.27)
Glucose: 156 mg/dL — ABNORMAL HIGH (ref 70–99)
Potassium: 4.7 mmol/L (ref 3.5–5.2)
Sodium: 142 mmol/L (ref 134–144)
eGFR: 83 mL/min/1.73 (ref >59)

## 2023-10-06 NOTE — Progress Notes (Addendum)
 Please attest and cosign this visit due to patients primary care provider not being in the office at the time the visit was completed.    Subjective:   Dennis Ayers is a 73 y.o. who presents for a Medicare Wellness preventive visit.  As a reminder, Annual Wellness Visits don't include a physical exam, and some assessments may be limited, especially if this visit is performed virtually. We may recommend an in-person follow-up visit with your provider if needed.  Visit Complete: Virtual I connected with  Dennis Ayers on 10/06/23 by a audio enabled telemedicine application and verified that I am speaking with the correct person using two identifiers.  Patient Location: Home  Provider Location: Home Office  I discussed the limitations of evaluation and management by telemedicine. The patient expressed understanding and agreed to proceed.  Vital Signs: Because this visit was a virtual/telehealth visit, some criteria may be missing or patient reported. Any vitals not documented were not able to be obtained and vitals that have been documented are patient reported.  VideoDeclined- This patient declined Librarian, academic. Therefore the visit was completed with audio only.  Persons Participating in Visit: Patient.  AWV Questionnaire: Yes: Patient Medicare AWV questionnaire was completed by the patient on 10/03/23; I have confirmed that all information answered by patient is correct and no changes since this date.  Cardiac Risk Factors include: advanced age (>46men, >60 women);diabetes mellitus;dyslipidemia;hypertension;male gender;obesity (BMI >30kg/m2);sedentary lifestyle     Objective:    Today's Vitals   10/03/23 1129 10/06/23 1339  Weight:  (!) 320 lb (145.2 kg)  Height:  6' 2 (1.88 m)  PainSc: 5     Body mass index is 41.09 kg/m.     10/06/2023    1:49 PM 10/04/2022    8:55 AM 01/26/2022    7:57 AM 10/01/2021    8:17 AM 01/26/2021    6:48 AM  09/23/2020    1:17 PM 10/02/2019    9:58 AM  Advanced Directives  Does Patient Have a Medical Advance Directive? Yes Yes No;Yes Yes No Yes Yes  Type of Estate agent of [place];Living will Healthcare Power of [place];Living will  Healthcare Power of [place];Living will  Healthcare Power of [place];Living will Healthcare Power of [place];Living will  Does patient want to make changes to medical advance directive?  No - Patient declined  Yes (Inpatient - patient defers changing a medical advance directive and declines information at this time)     Copy of Healthcare Power of Attorney in Chart? Yes - validated most recent copy scanned in chart (See row information) Yes - validated most recent copy scanned in chart (See row information)  Yes - validated most recent copy scanned in chart (See row information)  Yes - validated most recent copy scanned in chart (See row information) Yes - validated most recent copy scanned in chart (See row information)  Would patient like information on creating a medical advance directive?     No - Patient declined      Current Medications (verified) Outpatient Encounter Medications as of 10/06/2023  Medication Sig   acetaminophen  (TYLENOL ) 500 MG tablet Take 1,000 mg by mouth every 6 (six) hours as needed for moderate pain or mild pain.   aspirin EC 81 MG tablet Take 81 mg by mouth daily.   atorvastatin  (LIPITOR) 20 MG tablet TAKE 1 TABLET BY MOUTH ONCE  DAILY   augmented betamethasone dipropionate (DIPROLENE-AF) 0.05 % cream Apply topically 2 (two)  times daily as needed.   Continuous Glucose Sensor (FREESTYLE LIBRE 3 PLUS SENSOR) MISC E11.69 Change sensor every 15 days.   doxylamine, Sleep, (UNISOM) 25 MG tablet Take 25 mg by mouth at bedtime.   Dulaglutide  (TRULICITY ) 1.5 MG/0.5ML SOPN Inject 1.5 mg into the skin once a week.   Krill Oil 300 MG CAPS Take 300 mg by mouth daily.   losartan  (COZAAR ) 100 MG tablet TAKE 1 TABLET BY MOUTH DAILY    metFORMIN  (GLUCOPHAGE ) 500 MG tablet Take 1 tablet (500 mg total) by mouth daily with breakfast.   Misc Natural Products (OSTEO BI-FLEX JOINT SHIELD PO) Take 1 capsule by mouth daily.    Multiple Vitamin (MULTIVITAMIN) tablet Take 1 tablet by mouth daily.   OneTouch Delica Lancets 33G MISC Use as directed to check blood sugar once daily as needed.  Dx code: E11.9   glucose blood (ONETOUCH ULTRA) test strip Check blood sugar once a day   No facility-administered encounter medications on file as of 10/06/2023.    Allergies (verified) Lisinopril    History: Past Medical History:  Diagnosis Date   Allergy    Benign neoplasm of cecum - 25 mm polyp 08/06/2015   Cataract    Coronary artery calcification    Diabetes mellitus without complication (HCC)    DJD (degenerative joint disease) of knee    [place] steroid injection (01/2014)   Family history of ovarian cancer    Hyperglycemia    Hyperlipidemia    Hypertension    Jaundice    with icterus, when very young, treated and resolved   Knee pain    Morbid obesity (HCC)    OSA (obstructive sleep apnea)    mild to mod, working on weight loss, no cpap needed per test   Osteoarthritis of knee 07/23/2011   blat s/p L TKR ([place])   Redundant colon 11/16/2017   Severe obesity (BMI >= 40) (HCC) 02/15/2008   Sleep apnea    does not have a cpap   Viral hepatitis 8th grade   has had jaundice in past, unsure of cause ?Hep A   Past Surgical History:  Procedure Laterality Date   CHOLECYSTECTOMY  1998   COLONOSCOPY  08/20/2011   4 adenomatous polyps - rpt 3 yrs Vernice FORBES Commander)   COLONOSCOPY  07/22/2015   mult TAs, severe diverticulosis, one large 2.5cm cecal polyp pending resection Ollen)   COLONOSCOPY N/A 09/10/2015   large TA, rpt 1 yr Vernice FORBES Commander, MD)   COLONOSCOPY  11/2017   7 TAs, 3 SSPs, diverticulosis, rpt 2 yrs Ollen)   COLONOSCOPY  12/2019   multiple TA and SSP, diverticulosis with excess colon looping, rpt 1 year  ([place])   COLONOSCOPY  01/2022   TA, diverticulosis, rpt 2 yrs Ollen)   COLONOSCOPY WITH PROPOFOL  N/A 10/29/2016   TA, rpt 1 yr Ollen, Lupita FORBES, MD)   COLONOSCOPY WITH PROPOFOL  N/A 01/26/2021   diverticulosis, int hem, significant looping of colon making complete exam difficult - rpt 1 yr Ollen, Lupita FORBES, MD)   COLONOSCOPY WITH PROPOFOL  N/A 01/26/2022   TA, diverticulosis, rpt 2 yrs Ollen Lupita FORBES, MD)   EYE SURGERY Right    laser   HOT HEMOSTASIS N/A 10/29/2016   Procedure: HOT HEMOSTASIS (ARGON PLASMA COAGULATION/BICAP);  Surgeon: Commander Lupita FORBES, MD;  Location: [facility];  Service: Endoscopy;  Laterality: N/A;   JOINT REPLACEMENT  Left Knee replacement 2014   POLYPECTOMY  01/26/2022   Procedure: POLYPECTOMY;  Surgeon: Commander Lupita FORBES,  MD;  Location: [facility];  Service: Gastroenterology;;   TONSILLECTOMY     TOTAL KNEE ARTHROPLASTY Left 07/25/2012   Surgeon: Fonda SHAUNNA Olmsted, MD   Varus Gonarthrosis  04/02/2002   Dr. Glendia Hutchinson   Family History  Problem Relation Age of Onset   Ovarian cancer Mother    Ulcers Mother        stomach   Cancer Mother    Coronary artery disease Father 69       CABG X4   Hypertension Father    Stroke Father 83   Heart attack Father 60   Heart attack Sister 28   Heart Problems Paternal Aunt    Heart attack Paternal Aunt    Heart attack Paternal Uncle    Heart attack Paternal Uncle    Diabetes Neg Hx    Prostate cancer Neg Hx    Breast cancer Neg Hx    Colon cancer Neg Hx    Depression Neg Hx    Alcohol abuse Neg Hx    Colon polyps Neg Hx    Stomach cancer Neg Hx    Rectal cancer Neg Hx    Esophageal cancer Neg Hx    Social History   Socioeconomic History   Marital status: Married    Spouse name: Not on file   Number of children: 1   Years of education: Not on file   Highest education level: Associate degree: academic program  Occupational History   Occupation: Self-employed  Tobacco Use   Smoking status: Never    Smokeless tobacco: Never  Vaping Use   Vaping status: Never Used  Substance and Sexual Activity   Alcohol use: No   Drug use: No   Sexual activity: Not Currently  Other Topics Concern   Not on file  Social History Narrative   Caffeine: 2-3 diet sodas/day   Lives with wife   1 grown son.   Occupation: Self-employed; Administrator   Activity: some golf, limited by knee pain   Diet: good water, daily vegetables   Social Drivers of Corporate investment banker Strain: Low Risk  (10/03/2023)   Overall Financial Resource Strain (CARDIA)    Difficulty of Paying Living Expenses: Not hard at all  Food Insecurity: No Food Insecurity (10/03/2023)   Hunger Vital Sign    Worried About Running Out of Food in the Last Year: Never true    Ran Out of Food in the Last Year: Never true  Transportation Needs: No Transportation Needs (10/03/2023)   PRAPARE - Administrator, Civil Service (Medical): No    Lack of Transportation (Non-Medical): No  Physical Activity: Unknown (10/03/2023)   Exercise Vital Sign    Days of Exercise per Week: Patient declined    Minutes of Exercise per Session: Not on file  Stress: No Stress Concern Present (10/03/2023)   [employer] of Occupational Health - Occupational Stress Questionnaire    Feeling of Stress: Not at all  Social Connections: Socially Isolated (10/03/2023)   Social Connection and Isolation Panel    Frequency of Communication with Friends and Family: Once a week    Frequency of Social Gatherings with Friends and Family: Once a week    Attends Religious Services: Patient declined    Database administrator or Organizations: No    Attends Engineer, structural: Not on file    Marital Status: Married    Tobacco Counseling Counseling given: Not Answered    Clinical  Intake:  Pre-visit preparation completed: Yes  Pain : 0-10 Pain Score: 5  Pain Type: Chronic pain Pain Location: Shoulder Pain  Orientation: Right Pain Descriptors / Indicators: Aching Pain Onset: More than a month ago Pain Frequency: Intermittent Pain Relieving Factors: tylenol   Pain Relieving Factors: tylenol   BMI - recorded: 41.09 Nutritional Status: BMI > 30  Obese Nutritional Risks: None Diabetes: Yes CBG done?: Yes (BS-101 this am) CBG resulted in Enter/ Edit results?: No Did pt. bring in CBG monitor from home?: No  Lab Results  Component Value Date   HGBA1C 6.4 (A) 04/18/2023   HGBA1C 6.4 10/08/2022   HGBA1C 5.9 (A) 04/14/2022     How often do you need to have someone help you when you read instructions, pamphlets, or other written materials from your doctor or pharmacy?: 1 - Never  Interpreter Needed?: No  Comments: lives with wife Information entered by :: B.Quintus Premo,LPN   Activities of Daily Living     10/03/2023   11:29 AM  In your present state of health, do you have any difficulty performing the following activities:  Hearing? 0  Vision? 0  Difficulty concentrating or making decisions? 0  Walking or climbing stairs? 0  Dressing or bathing? 0  Doing errands, shopping? 0  Preparing Food and eating ? N  Using the Toilet? N  In the past six months, have you accidently leaked urine? N  Do you have problems with loss of bowel control? N  Managing your Medications? N  Managing your Finances? N  Housekeeping or managing your Housekeeping? N    Patient Care Team: Rilla Baller, MD as PCP - General (Family Medicine) End, Lonni, MD as PCP - Cardiology (Cardiology) Josefina Chew, MD as Consulting Physician (Orthopedic Surgery) End, Lonni, MD as Consulting Physician (Cardiology) Avram Lupita BRAVO, MD as Consulting Physician (Gastroenterology) Glendia Simmonds, OD as Consulting Physician (Optometry) Myra Rosaline FALCON, [facility] (Inactive) as Pharmacist (Pharmacist)  I have updated your Care Teams any recent Medical Services you may have received from other providers in the past  year.     Assessment:   This is a routine wellness examination for [place].  Hearing/Vision screen Hearing Screening - Comments:: Pt says his hearing is good:has ringing in ear Vision Screening - Comments:: Pt says his vision w/glasses Dr Simmonds Glendia   Goals Addressed               This Visit's Progress     DIET - EAT MORE FRUITS AND VEGETABLES   On track     10/06/23      COMPLETED: Increase physical activity (pt-stated)        Starting 09/03/2016, I will continue to stay active working in the yard and try to increase walking as tolerated. I will also continue following a low carbohydrate diet in an effort to lose weight and control my blood sugar.      COMPLETED: Patient Stated        Starting 09/09/2017, I will continue to take medications as prescribed.       Patient Stated   On track     10/06/23- I will maintain and continue medications prescribed.       Patient Stated   Not on track     10/06/23-Be more active.       Depression Screen     10/06/2023    1:46 PM 10/15/2022    8:00 AM 10/04/2022    8:54 AM 04/14/2022    8:10 AM 10/01/2021  8:15 AM 09/23/2020    1:19 PM 10/02/2019   10:01 AM  PHQ 2/9 Scores  PHQ - 2 Score 0 0 0 0 0 0 0  PHQ- 9 Score  0   0 0 0    Fall Risk     10/03/2023   11:29 AM 10/15/2022    8:00 AM 10/01/2022    9:02 AM 04/14/2022    8:09 AM 10/01/2021    8:17 AM  Fall Risk   Falls in the past year? 0 0 0 1 0  Number falls in past yr:  0 0 0 0  Injury with Fall? 0 0 0 0 0  Risk for fall due to : No Fall Risks No Fall Risks No Fall Risks  No Fall Risks  Follow up Education provided;Falls prevention discussed Falls evaluation completed Falls prevention discussed;Falls evaluation completed  Falls evaluation completed      Data saved with a previous flowsheet row definition    MEDICARE RISK AT HOME:  Medicare Risk at Home Any stairs in or around the home?: (Patient-Rptd) Yes If so, are there any without handrails?: (Patient-Rptd) No Home  free of loose throw rugs in walkways, pet beds, electrical cords, etc?: (Patient-Rptd) Yes Adequate lighting in your home to reduce risk of falls?: (Patient-Rptd) Yes Life alert?: (Patient-Rptd) No Use of a cane, walker or w/c?: (Patient-Rptd) No Grab bars in the bathroom?: (Patient-Rptd) Yes Shower chair or bench in shower?: (Patient-Rptd) Yes Elevated toilet seat or a handicapped toilet?: (Patient-Rptd) No  TIMED UP AND GO:  Was the test performed?  No  Cognitive Function: 6CIT completed    09/23/2020    1:23 PM 10/02/2019   10:02 AM 09/25/2018    9:16 AM 09/09/2017    9:14 AM 09/03/2016    9:02 AM  MMSE - Mini Mental State Exam  Orientation to time 5 5 5 5 5    Orientation to Place 5 5 5 5 5    Registration 3 3 3 3 3    Attention/ Calculation 5 5 5  0 0   Recall 3 3 3 3 3    Language- name 2 objects   0 0 0   Language- repeat 1 1 1 1 1   Language- follow 3 step command   0 3 3   Language- read & follow direction   0 0 0   Write a sentence   0 0 0   Copy design   0 0 0   Total score   22 20 20       Data saved with a previous flowsheet row definition        10/06/2023    1:49 PM 10/04/2022    8:56 AM 10/01/2021    8:21 AM  6CIT Screen  What Year? 0 points 0 points 0 points  What month? 0 points 0 points 0 points  What time? 0 points 0 points 0 points  Count back from 20 0 points 0 points 0 points  Months in reverse 0 points 0 points 0 points  Repeat phrase 0 points 0 points 0 points  Total Score 0 points 0 points 0 points    Immunizations Immunization History  Administered Date(s) Administered   Fluad Quad(high Dose 65+) 11/09/2021   Hepatitis B, ADULT 03/11/2017, 06/14/2017, 10/18/2017   Influenza Whole 11/23/2011   Influenza, High Dose Seasonal PF 11/03/2014, 11/21/2015, 10/01/2016, 10/20/2018, 11/01/2019, 11/02/2019, 09/26/2020, 11/02/2022   Influenza-Unspecified 09/30/2017, 11/09/2021   PFIZER Comirnaty(Gray Top)Covid-19 Tri-Sucrose Vaccine 05/23/2020, 11/09/2021  PFIZER(Purple Top)SARS-COV-2 Vaccination 04/02/2019, 04/25/2019, 11/22/2019   PNEUMOCOCCAL CONJUGATE-20 08/07/2020   Pfizer Covid-19 Vaccine Bivalent Booster 78yrs & up 11/28/2020, 07/08/2021   Pfizer(Comirnaty)Fall Seasonal Vaccine 12 years and older 11/02/2022   Pneumococcal Conjugate-13 07/10/2015   Pneumococcal Polysaccharide-23 11/21/2015   Respiratory Syncytial Virus Vaccine,Recomb Aduvanted(Arexvy) 12/25/2021   Td 03/23/2002   Tdap 07/10/2015   Zoster Recombinant(Shingrix) 11/18/2017, 02/22/2018   Zoster, Live 07/23/2011    Screening Tests Health Maintenance  Topic Date Due   Diabetic kidney evaluation - Urine ACR  Never done   COVID-19 Vaccine (9 - Pfizer risk 2024-25 season) 05/02/2023   INFLUENZA VACCINE  09/23/2023   Colonoscopy  01/27/2024   HEMOGLOBIN A1C  10/16/2023   FOOT EXAM  04/17/2024   OPHTHALMOLOGY EXAM  07/18/2024   Diabetic kidney evaluation - eGFR measurement  10/04/2024   Medicare Annual Wellness (AWV)  10/05/2024   DTaP/Tdap/Td (3 - Td or Tdap) 07/09/2025   Pneumococcal Vaccine: 50+ Years  Completed   Hepatitis C Screening  Completed   Zoster Vaccines- Shingrix  Completed   HPV VACCINES  Aged Out   Meningococcal B Vaccine  Aged Out   Pneumococcal Vaccine  Discontinued   Hepatitis B Vaccines 19-59 Average Risk  Discontinued    Health Maintenance  Health Maintenance Due  Topic Date Due   Diabetic kidney evaluation - Urine ACR  Never done   COVID-19 Vaccine (9 - Pfizer risk 2024-25 season) 05/02/2023   INFLUENZA VACCINE  09/23/2023   Colonoscopy  01/27/2024   Health Maintenance Items Addressed: None at this time  Additional Screening:  Vision Screening: Recommended annual ophthalmology exams for early detection of glaucoma and other disorders of the eye. Would you like a referral to an eye doctor? No    Dental Screening: Recommended annual dental exams for proper oral hygiene  Community Resource Referral / Chronic Care Management: CRR  required this visit?  No   CCM required this visit?  No   Plan:    I have personally reviewed and noted the following in the patient's chart:   Medical and social history Use of alcohol, tobacco or illicit drugs  Current medications and supplements including opioid prescriptions. Patient is not currently taking opioid prescriptions. Functional ability and status Nutritional status Physical activity Advanced directives List of other physicians Hospitalizations, surgeries, and ER visits in previous 12 months Vitals Screenings to include cognitive, depression, and falls Referrals and appointments  In addition, I have reviewed and discussed with patient certain preventive protocols, quality metrics, and best practice recommendations. A written personalized care plan for preventive services as well as general preventive health recommendations were provided to patient.   Dennis LITTIE Saris, LPN   [id number]   After Visit Summary: (MyChart) Due to this being a telephonic visit, the after visit summary with patients personalized plan was offered to patient via MyChart   Notes: Nothing significant to report at this time.

## 2023-10-06 NOTE — Patient Instructions (Signed)
 Mr. Tallman , Thank you for taking time out of your busy schedule to complete your Annual Wellness Visit with me. I enjoyed our conversation and look forward to speaking with you again next year. I, as well as your care team,  appreciate your ongoing commitment to your health goals. Please review the following plan we discussed and let me know if I can assist you in the future. Your Game plan/ To Do List    Referrals: If you haven't heard from the office you've been referred to, please reach out to them at the phone provided.   Follow up Visits: We will see or speak with you next year for your Next Medicare AWV with our clinical staff Have you seen your provider in the last 6 months (3 months if uncontrolled diabetes)? Yes  Clinician Recommendations:  Aim for 30 minutes of exercise or brisk walking, 6-8 glasses of water, and 5 servings of fruits and vegetables each day.       This is a list of the screenings recommended for you:  Health Maintenance  Topic Date Due   Yearly kidney health urinalysis for diabetes  Never done   COVID-19 Vaccine (9 - Pfizer risk 2024-25 season) 05/02/2023   Flu Shot  09/23/2023   Colon Cancer Screening  01/27/2024   Hemoglobin A1C  10/16/2023   Complete foot exam   04/17/2024   Eye exam for diabetics  07/18/2024   Yearly kidney function blood test for diabetes  10/04/2024   Medicare Annual Wellness Visit  10/05/2024   DTaP/Tdap/Td vaccine (3 - Td or Tdap) 07/09/2025   Pneumococcal Vaccine for age over 71  Completed   Hepatitis C Screening  Completed   Zoster (Shingles) Vaccine  Completed   HPV Vaccine  Aged Out   Meningitis B Vaccine  Aged Out   Pneumococcal Vaccine  Discontinued   Hepatitis B Vaccine  Discontinued    Advanced directives: (In Chart) A copy of your advanced directives are scanned into your chart should your provider ever need it. Advance Care Planning is important because it:  [x]  Makes sure you receive the medical care that is  consistent with your values, goals, and preferences  [x]  It provides guidance to your family and loved ones and reduces their decisional burden about whether or not they are making the right decisions based on your wishes.  Follow the link provided in your after visit summary or read over the paperwork we have mailed to you to help you started getting your Advance Directives in place. If you need assistance in completing these, please reach out to us  so that we can help you!

## 2023-10-07 ENCOUNTER — Ambulatory Visit: Payer: Self-pay | Admitting: Internal Medicine

## 2023-10-08 ENCOUNTER — Other Ambulatory Visit: Payer: Self-pay | Admitting: Family Medicine

## 2023-10-08 DIAGNOSIS — E538 Deficiency of other specified B group vitamins: Secondary | ICD-10-CM

## 2023-10-08 DIAGNOSIS — D751 Secondary polycythemia: Secondary | ICD-10-CM

## 2023-10-08 DIAGNOSIS — E1169 Type 2 diabetes mellitus with other specified complication: Secondary | ICD-10-CM

## 2023-10-08 DIAGNOSIS — Z125 Encounter for screening for malignant neoplasm of prostate: Secondary | ICD-10-CM

## 2023-10-10 ENCOUNTER — Ambulatory Visit
Admission: RE | Admit: 2023-10-10 | Discharge: 2023-10-10 | Disposition: A | Discharge status: Home / Self Care | Source: Ambulatory Visit | Attending: Internal Medicine | Admitting: Internal Medicine

## 2023-10-10 ENCOUNTER — Other Ambulatory Visit (INDEPENDENT_AMBULATORY_CARE_PROVIDER_SITE_OTHER): Payer: Medicare Other

## 2023-10-10 DIAGNOSIS — I719 Aortic aneurysm of unspecified site, without rupture: Secondary | ICD-10-CM | POA: Diagnosis not present

## 2023-10-10 DIAGNOSIS — I77810 Thoracic aortic ectasia: Secondary | ICD-10-CM | POA: Insufficient documentation

## 2023-10-10 DIAGNOSIS — E1169 Type 2 diabetes mellitus with other specified complication: Secondary | ICD-10-CM

## 2023-10-10 DIAGNOSIS — E785 Hyperlipidemia, unspecified: Secondary | ICD-10-CM

## 2023-10-10 DIAGNOSIS — I2510 Atherosclerotic heart disease of native coronary artery without angina pectoris: Secondary | ICD-10-CM | POA: Diagnosis not present

## 2023-10-10 DIAGNOSIS — E538 Deficiency of other specified B group vitamins: Secondary | ICD-10-CM | POA: Diagnosis not present

## 2023-10-10 DIAGNOSIS — D751 Secondary polycythemia: Secondary | ICD-10-CM | POA: Diagnosis not present

## 2023-10-10 DIAGNOSIS — Z125 Encounter for screening for malignant neoplasm of prostate: Secondary | ICD-10-CM | POA: Diagnosis not present

## 2023-10-10 DIAGNOSIS — I700 Atherosclerosis of aorta: Secondary | ICD-10-CM | POA: Diagnosis not present

## 2023-10-10 LAB — CBC WITH DIFFERENTIAL/PLATELET
Basophils Absolute: 0 K/uL (ref 0.0–0.1)
Basophils Relative: 0.6 % (ref 0.0–3.0)
Eosinophils Absolute: 0.1 K/uL (ref 0.0–0.7)
Eosinophils Relative: 2.9 % (ref 0.0–5.0)
HCT: 46.4 % (ref 39.0–52.0)
Hemoglobin: 15.9 g/dL (ref 13.0–17.0)
Lymphocytes Relative: 35.7 % (ref 12.0–46.0)
Lymphs Abs: 1.8 K/uL (ref 0.7–4.0)
MCHC: 34.3 g/dL (ref 30.0–36.0)
MCV: 94.4 fl (ref 78.0–100.0)
Monocytes Absolute: 0.4 K/uL (ref 0.1–1.0)
Monocytes Relative: 8.6 % (ref 3.0–12.0)
Neutro Abs: 2.7 K/uL (ref 1.4–7.7)
Neutrophils Relative %: 52.2 % (ref 43.0–77.0)
Platelets: 219 K/uL (ref 150.0–400.0)
RBC: 4.91 Mil/uL (ref 4.22–5.81)
RDW: 13.1 % (ref 11.5–15.5)
WBC: 5.1 K/uL (ref 4.0–10.5)

## 2023-10-10 LAB — LIPID PANEL
Cholesterol: 103 mg/dL (ref 0–200)
HDL: 51 mg/dL (ref >39.00)
LDL Cholesterol: 38 mg/dL (ref 0–99)
NonHDL: 52.41
Total CHOL/HDL Ratio: 2
Triglycerides: 71 mg/dL (ref 0.0–149.0)
VLDL: 14.2 mg/dL (ref 0.0–40.0)

## 2023-10-10 LAB — COMPREHENSIVE METABOLIC PANEL WITH GFR
ALT: 24 U/L (ref 0–53)
AST: 15 U/L (ref 0–37)
Albumin: 4.3 g/dL (ref 3.5–5.2)
Alkaline Phosphatase: 66 U/L (ref 39–117)
BUN: 18 mg/dL (ref 6–23)
CO2: 26 meq/L (ref 19–32)
Calcium: 9.2 mg/dL (ref 8.4–10.5)
Chloride: 103 meq/L (ref 96–112)
Creatinine, Ser: 0.9 mg/dL (ref 0.40–1.50)
GFR: 84.47 mL/min (ref >60.00)
Glucose, Bld: 165 mg/dL — ABNORMAL HIGH (ref 70–99)
Potassium: 4.3 meq/L (ref 3.5–5.1)
Sodium: 140 meq/L (ref 135–145)
Total Bilirubin: 0.9 mg/dL (ref 0.2–1.2)
Total Protein: 6.5 g/dL (ref 6.0–8.3)

## 2023-10-10 LAB — VITAMIN B12: Vitamin B-12: 431 pg/mL (ref 211–911)

## 2023-10-10 LAB — MICROALBUMIN / CREATININE URINE RATIO
Creatinine,U: 163.4 mg/dL
Microalb Creat Ratio: 43.4 mg/g — ABNORMAL HIGH (ref 0.0–30.0)
Microalb, Ur: 7.1 mg/dL — ABNORMAL HIGH (ref 0.0–1.9)

## 2023-10-10 LAB — HEMOGLOBIN A1C: Hgb A1c MFr Bld: 7 % — ABNORMAL HIGH (ref 4.6–6.5)

## 2023-10-10 LAB — PSA: PSA: 3.15 ng/mL (ref 0.10–4.00)

## 2023-10-10 LAB — TSH: TSH: 2.01 u[IU]/mL (ref 0.35–5.50)

## 2023-10-10 MED ORDER — IOHEXOL 350 MG/ML SOLN
75.0000 mL | Freq: Once | INTRAVENOUS | Status: AC | PRN
  Administered 2023-10-10: 75 mL via INTRAVENOUS

## 2023-10-11 ENCOUNTER — Ambulatory Visit: Payer: Self-pay | Admitting: Family Medicine

## 2023-10-17 ENCOUNTER — Encounter: Payer: Self-pay | Admitting: Family Medicine

## 2023-10-17 ENCOUNTER — Ambulatory Visit: Payer: Medicare Other | Admitting: Family Medicine

## 2023-10-17 VITALS — BP 118/72 | HR 96 | Temp 97.7°F | Ht 73.0 in | Wt 314.5 lb

## 2023-10-17 DIAGNOSIS — I2510 Atherosclerotic heart disease of native coronary artery without angina pectoris: Secondary | ICD-10-CM | POA: Diagnosis not present

## 2023-10-17 DIAGNOSIS — I10 Essential (primary) hypertension: Secondary | ICD-10-CM | POA: Diagnosis not present

## 2023-10-17 DIAGNOSIS — Z7985 Long-term (current) use of injectable non-insulin antidiabetic drugs: Secondary | ICD-10-CM

## 2023-10-17 DIAGNOSIS — Z7984 Long term (current) use of oral hypoglycemic drugs: Secondary | ICD-10-CM | POA: Diagnosis not present

## 2023-10-17 DIAGNOSIS — I77810 Thoracic aortic ectasia: Secondary | ICD-10-CM | POA: Diagnosis not present

## 2023-10-17 DIAGNOSIS — G4733 Obstructive sleep apnea (adult) (pediatric): Secondary | ICD-10-CM | POA: Diagnosis not present

## 2023-10-17 DIAGNOSIS — E118 Type 2 diabetes mellitus with unspecified complications: Secondary | ICD-10-CM

## 2023-10-17 DIAGNOSIS — Z0000 Encounter for general adult medical examination without abnormal findings: Secondary | ICD-10-CM | POA: Diagnosis not present

## 2023-10-17 DIAGNOSIS — E785 Hyperlipidemia, unspecified: Secondary | ICD-10-CM

## 2023-10-17 DIAGNOSIS — E1169 Type 2 diabetes mellitus with other specified complication: Secondary | ICD-10-CM | POA: Diagnosis not present

## 2023-10-17 DIAGNOSIS — Z7189 Other specified counseling: Secondary | ICD-10-CM | POA: Diagnosis not present

## 2023-10-17 DIAGNOSIS — Z860100 Personal history of colon polyps, unspecified: Secondary | ICD-10-CM

## 2023-10-17 MED ORDER — ATORVASTATIN CALCIUM 20 MG PO TABS: 20.0000 mg | ORAL_TABLET | Freq: Every day | ORAL | 3 refills | Status: DC

## 2023-10-17 MED ORDER — LOSARTAN POTASSIUM 100 MG PO TABS: 100.0000 mg | ORAL_TABLET | Freq: Every day | ORAL | 3 refills | Status: AC

## 2023-10-17 MED ORDER — METFORMIN HCL 500 MG PO TABS: 500.0000 mg | ORAL_TABLET | Freq: Every day | ORAL | 3 refills | Status: AC

## 2023-10-17 MED ORDER — ATORVASTATIN CALCIUM 20 MG PO TABS: 20.0000 mg | ORAL_TABLET | Freq: Every day | ORAL | 3 refills | Status: AC

## 2023-10-17 MED ORDER — TRULICITY 3 MG/0.5ML ~~LOC~~ SOAJ: 3.0000 mg | SUBCUTANEOUS | 3 refills | Status: AC

## 2023-10-17 MED ORDER — METFORMIN HCL 500 MG PO TABS
500.0000 mg | ORAL_TABLET | Freq: Every day | ORAL | 3 refills | Status: DC
Start: 2023-10-17 — End: 2023-10-17

## 2023-10-17 MED ORDER — LOSARTAN POTASSIUM 100 MG PO TABS: 100.0000 mg | ORAL_TABLET | Freq: Every day | ORAL | 3 refills | Status: DC

## 2023-10-17 NOTE — Assessment & Plan Note (Signed)
 Mild on last check 2014.  Denies OSA symptoms at this time.

## 2023-10-17 NOTE — Assessment & Plan Note (Signed)
 Chronic ,overall stable readings on current regimen. Desires to try higher trulicity  dose for better glycemic control and weight loss which is reasonable - sent in 3mg  dose to pharmacy, monitoring for increased side effects, adverse effect ie pancreatitis

## 2023-10-17 NOTE — Assessment & Plan Note (Signed)
Continue to encourage healthy diet and lifestyle changes to affect sustainable weight loss.

## 2023-10-17 NOTE — Assessment & Plan Note (Signed)
 Previously discussed.

## 2023-10-17 NOTE — Progress Notes (Signed)
 Ph: [phone] Fax: [phone]   Patient ID: Dennis Ayers, male    DOB: 1950-10-06, 74 y.o.   MRN: [id number]  This visit was conducted in person.  BP 118/72   Pulse 96   Temp 97.7 F (36.5 C) (Oral)   Ht 6' 1 (1.854 m)   Wt (!) 314 lb 8 oz (142.7 kg)   SpO2 98%   BMI 41.49 kg/m    CC: CPE Subjective:   HPI: Dennis Ayers is a 73 y.o. male presenting on 10/17/2023 for Annual Exam   Saw health advisor 09/2023 for medicare wellness visit. Note reviewed.   No results found.  Flowsheet Row Office Visit from 10/17/2023 in [facility] [place]  PHQ-2 Total Score 0       10/17/2023    8:30 AM 10/03/2023   11:29 AM 10/15/2022    8:00 AM 10/01/2022    9:02 AM 04/14/2022    8:09 AM  Fall Risk   Falls in the past year? 0 0 0 0 1  Number falls in past yr: 0  0 0 0  Injury with Fall? 0 0 0 0 0  Risk for fall due to : No Fall Risks No Fall Risks No Fall Risks No Fall Risks   Follow up Falls evaluation completed Education provided;Falls prevention discussed Falls evaluation completed Falls prevention discussed;Falls evaluation completed     Wife still not doing well - he continues caring for her. She has memory difficulties.  He is fixing his own meals, eating out more.   Ongoing weight loss - he continues working in the yard.  Pending read for CTA chest/aorta done last week.   DM - continues Trulicity  1.5mg  weekly, metformin  500mg  daily. Using Freestyle Libre 3 CGM. Intrested in increase in trulicity  3mg  weekly dose.  CGM data: Freestyle Libre 3 plus 30 day average sugar: 153, time in range 84%, hypoglycemia 0%, stage 1 hyperglycemia 16%, stage 2 hyperglycemia 0%.     Last diabetic eye exam 06/2023   Discussed unisom use.  Notes some right posterior shoulder pain for several weeks.   Preventative: COLONOSCOPY Date: 06/2015 mult TAs, severe diverticulosis, 1 large 2.5cm cecal polyp pending resection [facility]llen) - 08/2015 large TA, rpt 1 yr  Ollen) COLONOSCOPY WITH PROPOFOL  10/29/2016 TA, rpt 1 yr Ollen, Lupita BRAVO, MD) COLONOSCOPY 11/2017 - 7 TAs, 3 SSPs, diverticulosis, rpt 2 yrs Ollen) Colonoscopy 12/2019 - multiple TA and SSP, diverticulosis with excess colon looping, rpt 1 year ([facility])  Colonoscopy 01/2021 - diverticulosis, incomplete, suspect polyposis syndrome, rpt 1 yr in hospital Ollen).  COLONOSCOPY 01/2022 - TA, diverticulosis, rpt 2 yrs Ollen)  Prostate cancer screening - discussed. Continue yearly PSA screening. Lung cancer screening - never smoker  AAA screen - never smoker, no fmhx AAA  Flu shot - yearly at [facility]  COVID vaccine - Pfizer 03/2019, 04/2019, booster 10/2019, 05/2020, bivalent 11/2020, 06/2021  Td 2004, Tdap 06/2015  Prevnar-13 06/2015, pneumovax 10/2015. Prevnar-20 07/2020 Zostavax - 2013 RSV - 12/2021 Shingrix - 10/2017, 02/2018 Hep B - completed 2019 Advanced directive discussion - has at home. Copy in chart 08/2015. HCPOA is wife and son. No prolonged life support if terminal illness.  Seat belt use discussed  Sunscreen use discussed, no changing moles on skin. Sees dermatologist.  Non smoker  Alcohol - none Dentist - Q6 mo Eye exam - yearly Dr Glendia Bowel - no constipation Bladder - no incontinence    Caffeine: 2-3 diet sodas/day  Lives with wife 1 grown son. Occupation: Self-employed; Administrator Activity: some golf, limited by knee pain  Diet: good water, daily vegetables      Relevant past medical, surgical, family and social history reviewed and updated as indicated. Interim medical history since our last visit reviewed. Allergies and medications reviewed and updated. Outpatient Medications Prior to Visit  Medication Sig Dispense Refill   acetaminophen  (TYLENOL ) 500 MG tablet Take 1,000 mg by mouth every 6 (six) hours as needed for moderate pain or mild pain.     aspirin EC 81 MG tablet Take 81 mg by mouth daily.     augmented betamethasone  dipropionate (DIPROLENE-AF) 0.05 % cream Apply topically 2 (two) times daily as needed.     Continuous Glucose Sensor (FREESTYLE LIBRE 3 PLUS SENSOR) MISC E11.69 Change sensor every 15 days. 6 each 3   doxylamine, Sleep, (UNISOM) 25 MG tablet Take 25 mg by mouth at bedtime.     glucose blood (ONETOUCH ULTRA) test strip Check blood sugar once a day 100 strip 3   Krill Oil 300 MG CAPS Take 300 mg by mouth daily.     Misc Natural Products (OSTEO BI-FLEX JOINT SHIELD PO) Take 1 capsule by mouth daily.      Multiple Vitamin (MULTIVITAMIN) tablet Take 1 tablet by mouth daily.     OneTouch Delica Lancets 33G MISC Use as directed to check blood sugar once daily as needed.  Dx code: E11.9 100 each 3   atorvastatin  (LIPITOR) 20 MG tablet TAKE 1 TABLET BY MOUTH ONCE  DAILY 100 tablet 0   Dulaglutide  (TRULICITY ) 1.5 MG/0.5ML SOPN Inject 1.5 mg into the skin once a week. 6 mL 4   losartan  (COZAAR ) 100 MG tablet TAKE 1 TABLET BY MOUTH DAILY 100 tablet 0   metFORMIN  (GLUCOPHAGE ) 500 MG tablet Take 1 tablet (500 mg total) by mouth daily with breakfast. 100 tablet 3   No facility-administered medications prior to visit.     Per HPI unless specifically indicated in ROS section below Review of Systems  Constitutional:  Negative for activity change, appetite change, chills, fatigue, fever and unexpected weight change.  HENT:  Negative for hearing loss.   Eyes:  Negative for visual disturbance.  Respiratory:  Negative for cough, chest tightness, shortness of breath and wheezing.   Cardiovascular:  Negative for chest pain, palpitations and leg swelling.  Gastrointestinal:  Negative for abdominal distention, abdominal pain, blood in stool, constipation, diarrhea, nausea and vomiting.  Genitourinary:  Negative for difficulty urinating and hematuria.  Musculoskeletal:  Negative for arthralgias, myalgias and neck pain.  Skin:  Negative for rash.  Neurological:  Negative for dizziness, seizures, syncope and  headaches.  Hematological:  Negative for adenopathy. Does not bruise/bleed easily.  Psychiatric/Behavioral:  Negative for dysphoric mood. The patient is not nervous/anxious.     Objective:  BP 118/72   Pulse 96   Temp 97.7 F (36.5 C) (Oral)   Ht 6' 1 (1.854 m)   Wt (!) 314 lb 8 oz (142.7 kg)   SpO2 98%   BMI 41.49 kg/m   Wt Readings from Last 3 Encounters:  10/17/23 (!) 314 lb 8 oz (142.7 kg)  10/06/23 (!) 320 lb (145.2 kg)  09/28/23 (!) 320 lb (145.2 kg)      Physical Exam Vitals and nursing note reviewed.  Constitutional:      General: He is not in acute distress.    Appearance: Normal appearance. He is well-developed. He is not ill-appearing.  HENT:     Head: Normocephalic and atraumatic.     Right Ear: Hearing, tympanic membrane, ear canal and external ear normal.     Left Ear: Hearing, tympanic membrane, ear canal and external ear normal.     Mouth/Throat:     Mouth: Mucous membranes are moist.     Pharynx: Oropharynx is clear. No oropharyngeal exudate or posterior oropharyngeal erythema.  Eyes:     General: No scleral icterus.    Extraocular Movements: Extraocular movements intact.     Conjunctiva/sclera: Conjunctivae normal.     Pupils: Pupils are equal, round, and reactive to light.  Neck:     Thyroid : No thyroid  mass or thyromegaly.     Vascular: No carotid bruit.  Cardiovascular:     Rate and Rhythm: Normal rate and regular rhythm.     Pulses: Normal pulses.          Radial pulses are 2+ on the right side and 2+ on the left side.     Heart sounds: Normal heart sounds. No murmur heard. Pulmonary:     Effort: Pulmonary effort is normal. No respiratory distress.     Breath sounds: Normal breath sounds. No wheezing, rhonchi or rales.  Abdominal:     General: Bowel sounds are normal. There is no distension.     Palpations: Abdomen is soft. There is no mass.     Tenderness: There is no abdominal tenderness. There is no guarding or rebound.     Hernia: No  hernia is present.  Musculoskeletal:        General: Normal range of motion.     Cervical back: Normal range of motion and neck supple.     Right lower leg: No edema.     Left lower leg: No edema.  Lymphadenopathy:     Cervical: No cervical adenopathy.  Skin:    General: Skin is warm and dry.     Findings: No rash.  Neurological:     General: No focal deficit present.     Mental Status: He is alert and oriented to person, place, and time.  Psychiatric:        Mood and Affect: Mood normal.        Behavior: Behavior normal.        Thought Content: Thought content normal.        Judgment: Judgment normal.       Results for orders placed or performed in visit on 10/10/23  TSH   Collection Time: 10/10/23  7:23 AM  Result Value Ref Range   TSH 2.01 0.35 - 5.50 uIU/mL  PSA   Collection Time: 10/10/23  7:23 AM  Result Value Ref Range   PSA 3.15 0.10 - 4.00 ng/mL  CBC with Differential/Platelet   Collection Time: 10/10/23  7:23 AM  Result Value Ref Range   WBC 5.1 4.0 - 10.5 K/uL   RBC 4.91 4.22 - 5.81 Mil/uL   Hemoglobin 15.9 13.0 - 17.0 g/dL   HCT 53.5 60.9 - 47.9 %   MCV 94.4 78.0 - 100.0 fl   MCHC 34.3 30.0 - 36.0 g/dL   RDW 86.8 88.4 - 84.4 %   Platelets 219.0 150.0 - 400.0 K/uL   Neutrophils Relative % 52.2 43.0 - 77.0 %   Lymphocytes Relative 35.7 12.0 - 46.0 %   Monocytes Relative 8.6 3.0 - 12.0 %   Eosinophils Relative 2.9 0.0 - 5.0 %   Basophils Relative 0.6 0.0 - 3.0 %   Neutro  Abs 2.7 1.4 - 7.7 K/uL   Lymphs Abs 1.8 0.7 - 4.0 K/uL   Monocytes Absolute 0.4 0.1 - 1.0 K/uL   Eosinophils Absolute 0.1 0.0 - 0.7 K/uL   Basophils Absolute 0.0 0.0 - 0.1 K/uL  Vitamin B12   Collection Time: 10/10/23  7:23 AM  Result Value Ref Range   Vitamin B-12 431 211 - 911 pg/mL  Comprehensive metabolic panel with GFR   Collection Time: 10/10/23  7:23 AM  Result Value Ref Range   Sodium 140 135 - 145 mEq/L   Potassium 4.3 3.5 - 5.1 mEq/L   Chloride 103 96 - 112 mEq/L   CO2  26 19 - 32 mEq/L   Glucose, Bld 165 (H) 70 - 99 mg/dL   BUN 18 6 - 23 mg/dL   Creatinine, Ser 9.09 0.40 - 1.50 mg/dL   Total Bilirubin 0.9 0.2 - 1.2 mg/dL   Alkaline Phosphatase 66 39 - 117 U/L   AST 15 0 - 37 U/L   ALT 24 0 - 53 U/L   Total Protein 6.5 6.0 - 8.3 g/dL   Albumin 4.3 3.5 - 5.2 g/dL   GFR 15.52 >39.99 mL/min   Calcium  9.2 8.4 - 10.5 mg/dL  Lipid panel   Collection Time: 10/10/23  7:23 AM  Result Value Ref Range   Cholesterol 103 0 - 200 mg/dL   Triglycerides 28.9 0.0 - 149.0 mg/dL   HDL 48.99 >60.99 mg/dL   VLDL 85.7 0.0 - 59.9 mg/dL   LDL Cholesterol 38 0 - 99 mg/dL   Total CHOL/HDL Ratio 2    NonHDL 52.41   Microalbumin / creatinine urine ratio   Collection Time: 10/10/23  7:23 AM  Result Value Ref Range   Microalb, Ur 7.1 (H) 0.0 - 1.9 mg/dL   Creatinine,U 836.5 mg/dL   Microalb Creat Ratio 43.4 (H) 0.0 - 30.0 mg/g  Hemoglobin A1c   Collection Time: 10/10/23  7:23 AM  Result Value Ref Range   Hgb A1c MFr Bld 7.0 (H) 4.6 - 6.5 %    Assessment & Plan:   Problem List Items Addressed This Visit     Health maintenance examination - Primary (Chronic)   Preventative protocols reviewed and updated unless pt declined. Discussed healthy diet and lifestyle.       Advanced care planning/counseling discussion (Chronic)   Previously discussed      Obesity, morbid, BMI 40.0-49.9 (HCC)   Continue to encourage healthy diet and lifestyle changes to affect sustainable weight loss.       Relevant Medications   Dulaglutide  (TRULICITY ) 3 MG/0.5ML SOAJ   metFORMIN  (GLUCOPHAGE ) 500 MG tablet   History of colonic polyps   Pending rpt colonoscopy 01/2024      OSA (obstructive sleep apnea)   Mild on last check 2014.  Denies OSA symptoms at this time.       Essential hypertension   Chronic, great control on current regimen - continue losartan . Denies hypotensive symptoms.       Relevant Medications   atorvastatin  (LIPITOR) 20 MG tablet   losartan  (COZAAR ) 100  MG tablet   Type 2 diabetes mellitus with other specified complication (HCC)   Chronic ,overall stable readings on current regimen. Desires to try higher trulicity  dose for better glycemic control and weight loss which is reasonable - sent in 3mg  dose to pharmacy, monitoring for increased side effects, adverse effect ie pancreatitis      Relevant Medications   Dulaglutide  (TRULICITY ) 3 MG/0.5ML SOAJ  atorvastatin  (LIPITOR) 20 MG tablet   losartan  (COZAAR ) 100 MG tablet   metFORMIN  (GLUCOPHAGE ) 500 MG tablet   Coronary artery calcification   Continue aspirin, statin, cardiology f/u      Relevant Medications   atorvastatin  (LIPITOR) 20 MG tablet   losartan  (COZAAR ) 100 MG tablet   Dilated aortic root (HCC)   Pending CTA chest/aorta read.       Relevant Medications   atorvastatin  (LIPITOR) 20 MG tablet   losartan  (COZAAR ) 100 MG tablet   Hyperlipidemia associated with type 2 diabetes mellitus (HCC) - goal LDL <70   Chronic, great control on atorvastatin , krill oil. The ASCVD Risk score (Arnett DK, et al., 2019) failed to calculate for the following reasons:   The valid total cholesterol range is 130 to 320 mg/dL       Relevant Medications   Dulaglutide  (TRULICITY ) 3 MG/0.5ML SOAJ   atorvastatin  (LIPITOR) 20 MG tablet   losartan  (COZAAR ) 100 MG tablet   metFORMIN  (GLUCOPHAGE ) 500 MG tablet   Other Visit Diagnoses       Controlled type 2 diabetes mellitus with complication, without long-term current use of insulin  (HCC)       Relevant Medications   Dulaglutide  (TRULICITY ) 3 MG/0.5ML SOAJ   atorvastatin  (LIPITOR) 20 MG tablet   losartan  (COZAAR ) 100 MG tablet   metFORMIN  (GLUCOPHAGE ) 500 MG tablet     Long-term (current) use of injectable non-insulin  antidiabetic drugs         Long term current use of oral hypoglycemic drug            Meds ordered this encounter  Medications   Dulaglutide  (TRULICITY ) 3 MG/0.5ML SOAJ    Sig: Inject 3 mg as directed once a week.     Dispense:  6 mL    Refill:  3    Note new dose   DISCONTD: atorvastatin  (LIPITOR) 20 MG tablet    Sig: Take 1 tablet (20 mg total) by mouth daily.    Dispense:  100 tablet    Refill:  3    Please send a replace/new response with 100-Day Supply if appropriate to maximize member benefit. Requesting 1 year supply.   DISCONTD: losartan  (COZAAR ) 100 MG tablet    Sig: Take 1 tablet (100 mg total) by mouth daily.    Dispense:  100 tablet    Refill:  3    Please send a replace/new response with 100-Day Supply if appropriate to maximize member benefit. Requesting 1 year supply.   DISCONTD: metFORMIN  (GLUCOPHAGE ) 500 MG tablet    Sig: Take 1 tablet (500 mg total) by mouth daily with breakfast.    Dispense:  100 tablet    Refill:  3   atorvastatin  (LIPITOR) 20 MG tablet    Sig: Take 1 tablet (20 mg total) by mouth daily.    Dispense:  100 tablet    Refill:  3    Please send a replace/new response with 100-Day Supply if appropriate to maximize member benefit. Requesting 1 year supply.   losartan  (COZAAR ) 100 MG tablet    Sig: Take 1 tablet (100 mg total) by mouth daily.    Dispense:  100 tablet    Refill:  3    Please send a replace/new response with 100-Day Supply if appropriate to maximize member benefit. Requesting 1 year supply.   metFORMIN  (GLUCOPHAGE ) 500 MG tablet    Sig: Take 1 tablet (500 mg total) by mouth daily with breakfast.    Dispense:  100  tablet    Refill:  3    No orders of the defined types were placed in this encounter.   Patient Instructions  Increase trulicity  to 3mg  weekly.  Good to see you today.  Return as needed or in 6 months for diabetes follow up visit.   Follow up plan: Return in about 6 months (around 04/18/2024) for follow up visit.  Anton Blas, MD

## 2023-10-17 NOTE — Assessment & Plan Note (Signed)
 Chronic, great control on atorvastatin , krill oil. The ASCVD Risk score (Arnett DK, et al., 2019) failed to calculate for the following reasons:   The valid total cholesterol range is 130 to 320 mg/dL

## 2023-10-17 NOTE — Assessment & Plan Note (Signed)
 Preventative protocols reviewed and updated unless pt declined. Discussed healthy diet and lifestyle.

## 2023-10-17 NOTE — Assessment & Plan Note (Signed)
 Continue aspirin, statin, cardiology f/u

## 2023-10-17 NOTE — Assessment & Plan Note (Signed)
 Pending CTA chest/aorta read.

## 2023-10-17 NOTE — Assessment & Plan Note (Signed)
 Pending rpt colonoscopy 01/2024

## 2023-10-17 NOTE — Patient Instructions (Addendum)
 Increase trulicity  to 3mg  weekly.  Good to see you today.  Return as needed or in 6 months for diabetes follow up visit.

## 2023-10-17 NOTE — Assessment & Plan Note (Signed)
 Chronic, great control on current regimen - continue losartan . Denies hypotensive symptoms.

## 2023-10-21 ENCOUNTER — Ambulatory Visit: Payer: Self-pay | Admitting: Internal Medicine

## 2023-11-02 DIAGNOSIS — H903 Sensorineural hearing loss, bilateral: Secondary | ICD-10-CM | POA: Diagnosis not present

## 2023-11-02 DIAGNOSIS — H6123 Impacted cerumen, bilateral: Secondary | ICD-10-CM | POA: Diagnosis not present

## 2023-11-03 ENCOUNTER — Encounter: Payer: Self-pay | Admitting: Family Medicine

## 2023-11-03 DIAGNOSIS — Z23 Encounter for immunization: Secondary | ICD-10-CM

## 2023-11-03 NOTE — Telephone Encounter (Signed)
 Updated pt's immunizations.   Pended Covid vaccine rx.

## 2023-11-04 MED ORDER — COVID-19 MRNA VAC-TRIS(PFIZER) 30 MCG/0.3ML IM SUSY: 0.3000 mL | PREFILLED_SYRINGE | Freq: Once | INTRAMUSCULAR | 0 refills | Status: AC

## 2023-11-04 NOTE — Telephone Encounter (Signed)
 ERx

## 2023-11-24 ENCOUNTER — Telehealth: Payer: Self-pay | Admitting: Internal Medicine

## 2023-11-24 DIAGNOSIS — D1391 Familial adenomatous polyposis: Secondary | ICD-10-CM

## 2023-11-24 NOTE — Telephone Encounter (Signed)
 Dr Avram can you confirm location of colon

## 2023-11-24 NOTE — Telephone Encounter (Signed)
 Please schedule for hospital in December 2025  Encounter Diagnosis  Name Primary?   Polyposis coli Yes

## 2023-11-24 NOTE — Telephone Encounter (Signed)
 Inbound call from patient stating he would like to be scheduled for his colonoscopy in December and would like to see if theres availability for the week of the 8th. Patient does have a recall in appt desk but for the [facility] just wanted to make sure before scheduling since patient stated all his procedures were done at the hospital. Please advise  Thank you

## 2023-11-30 ENCOUNTER — Encounter: Payer: Self-pay | Admitting: Family Medicine

## 2023-12-02 NOTE — Telephone Encounter (Signed)
Patient calling in regards to previous note. Please advise.

## 2023-12-05 ENCOUNTER — Other Ambulatory Visit: Payer: Self-pay

## 2023-12-05 DIAGNOSIS — D1391 Familial adenomatous polyposis: Secondary | ICD-10-CM

## 2023-12-05 MED ORDER — NA SULFATE-K SULFATE-MG SULF 17.5-3.13-1.6 GM/177ML PO SOLN: 1.0000 | Freq: Once | ORAL | 0 refills | Status: AC

## 2023-12-05 NOTE — Telephone Encounter (Signed)
 Patient returning call Requesting a call back  Please advise  Thank you

## 2023-12-05 NOTE — Telephone Encounter (Signed)
 The pt was counseled to pick up the prep from the pharmacy within the next week

## 2023-12-05 NOTE — Telephone Encounter (Signed)
 Colon has been set up for 12/1 at [facility] with CG at 805 am    Left message on machine to call back

## 2023-12-05 NOTE — Telephone Encounter (Signed)
Colon scheduled, pt instructed and medications reviewed.  Patient instructions mailed to home and sent to My Chart.  Patient to call with any questions or concerns.  ? ?

## 2024-01-12 NOTE — Progress Notes (Signed)
 Attempted to obtain medical history for pre op call via telephone, unable to reach at this time. HIPAA compliant voicemail message left requesting return call to pre surgical testing department.

## 2024-01-17 ENCOUNTER — Telehealth: Payer: Self-pay

## 2024-01-17 NOTE — Telephone Encounter (Signed)
 Procedure:COLON Procedure date: 01/23/24 Procedure location: [facility] Arrival Time: 6:35 Spoke with the patient Y/N: Y Any prep concerns? N  Has the patient obtained the prep from the pharmacy ? Y Do you have a care partner and transportation: Y Any additional concerns? N

## 2024-01-22 NOTE — H&P (Signed)
 [facility] History and Physical   Primary Care Physician:  Rilla Baller, MD   Reason for Procedure:  History of polyps/colonic polyposis of uncertain etiology  Plan:    Colonoscopy with abdominal binder   The patient was provided an opportunity to ask questions and all were answered. The patient agreed with the plan.   HPI: Dennis Ayers is a 73 y.o. male with a history of colon polyps and a diagnosis of colon colonic polyposis of uncertain etiology.  Genetic testing has been negative.  He has a history of difficult airway and some obstructive issues when sedated so his procedures are performed at the hospital.  08/20/2011 - all diminutive 3 serrated adenomas and 1 tub adenoma - routine repeat colon   07/22/2015 12 polyps removed 5-15 mm and one flat 25 mm polyp left in cecum - will discuss options for removal 10 adenomas or ssp - will attempt colonoscopy removal of the 25mm cecal polyp at hospital 09/10/15 25 mm flat/sessile tubular adenoma removed from cecum and small adenoma from ascending colon - recall  10/2016 diminutive cecal adenoma  11/16/2017 5 diminutive polyps removed  - adenomas and ssp's 22 total lifetime referred to genetics - testing negative 12/2019 13 polyps mostly adenomas and ssp's w/ some hyperplastic December 2022 incomplete exam no polyps  01/26/2022 1-2 mm cecal polyp removed (abdominal binder used) adenoma recall 2 years 2025 - hospital procedure Past Medical History:  Diagnosis Date   Allergy    Benign neoplasm of cecum - 25 mm polyp 08/06/2015   Cataract    Coronary artery calcification    Diabetes mellitus without complication (HCC)    DJD (degenerative joint disease) of knee    [place] steroid injection (01/2014)   Family history of ovarian cancer    Hyperglycemia    Hyperlipidemia    Hypertension    Jaundice    with icterus, when very young, treated and resolved   Knee pain    Morbid obesity (HCC)    OSA (obstructive sleep apnea)     mild to mod, working on weight loss, no cpap needed per test   Osteoarthritis of knee 07/23/2011   blat s/p L TKR ([place])   Redundant colon 11/16/2017   Severe obesity (BMI >= 40) (HCC) 02/15/2008   Sleep apnea    does not have a cpap   Viral hepatitis 8th grade   has had jaundice in past, unsure of cause ?Hep A    Past Surgical History:  Procedure Laterality Date   CHOLECYSTECTOMY  1998   COLONOSCOPY  08/20/2011   4 adenomatous polyps - rpt 3 yrs Vernice FORBES Commander)   COLONOSCOPY  07/22/2015   mult TAs, severe diverticulosis, one large 2.5cm cecal polyp pending resection Ollen)   COLONOSCOPY N/A 09/10/2015   large TA, rpt 1 yr Vernice FORBES Commander, MD)   COLONOSCOPY  11/2017   7 TAs, 3 SSPs, diverticulosis, rpt 2 yrs Ollen)   COLONOSCOPY  12/2019   multiple TA and SSP, diverticulosis with excess colon looping, rpt 1 year ([facility])   COLONOSCOPY  01/2022   TA, diverticulosis, rpt 2 yrs Ollen)   COLONOSCOPY WITH PROPOFOL  N/A 10/29/2016   TA, rpt 1 yr Ollen, Lupita FORBES, MD)   COLONOSCOPY WITH PROPOFOL  N/A 01/26/2021   diverticulosis, int hem, significant looping of colon making complete exam difficult - rpt 1 yr Ollen, Lupita FORBES, MD)   COLONOSCOPY WITH PROPOFOL  N/A 01/26/2022   TA, diverticulosis, rpt 2 yrs Ollen, Lupita FORBES, MD)  EYE SURGERY Right    laser   HOT HEMOSTASIS N/A 10/29/2016   Procedure: HOT HEMOSTASIS (ARGON PLASMA COAGULATION/BICAP);  Surgeon: Avram Lupita BRAVO, MD;  Location: [facility];  Service: Endoscopy;  Laterality: N/A;   JOINT REPLACEMENT  Left Knee replacement 2014   POLYPECTOMY  01/26/2022   Procedure: POLYPECTOMY;  Surgeon: Avram Lupita BRAVO, MD;  Location: [facility];  Service: Gastroenterology;;   TONSILLECTOMY     TOTAL KNEE ARTHROPLASTY Left 07/25/2012   Surgeon: Fonda SHAUNNA Olmsted, MD   Varus Gonarthrosis  04/02/2002   Dr. Glendia Hutchinson     No current facility-administered medications for this encounter.   Current Outpatient Medications   Medication Sig Dispense Refill   acetaminophen  (TYLENOL ) 500 MG tablet Take 1,000 mg by mouth every 6 (six) hours as needed for moderate pain or mild pain.     aspirin EC 81 MG tablet Take 81 mg by mouth daily.     atorvastatin  (LIPITOR) 20 MG tablet Take 1 tablet (20 mg total) by mouth daily. 100 tablet 3   augmented betamethasone dipropionate (DIPROLENE-AF) 0.05 % cream Apply topically 2 (two) times daily as needed.     Continuous Glucose Sensor (FREESTYLE LIBRE 3 PLUS SENSOR) MISC E11.69 Change sensor every 15 days. 6 each 3   doxylamine, Sleep, (UNISOM) 25 MG tablet Take 25 mg by mouth at bedtime.     Dulaglutide  (TRULICITY ) 3 MG/0.5ML SOAJ Inject 3 mg as directed once a week. 6 mL 3   glucose blood (ONETOUCH ULTRA) test strip Check blood sugar once a day 100 strip 3   Krill Oil 300 MG CAPS Take 300 mg by mouth daily.     losartan  (COZAAR ) 100 MG tablet Take 1 tablet (100 mg total) by mouth daily. 100 tablet 3   metFORMIN  (GLUCOPHAGE ) 500 MG tablet Take 1 tablet (500 mg total) by mouth daily with breakfast. 100 tablet 3   Misc Natural Products (OSTEO BI-FLEX JOINT SHIELD PO) Take 1 capsule by mouth daily.      Multiple Vitamin (MULTIVITAMIN) tablet Take 1 tablet by mouth daily.     OneTouch Delica Lancets 33G MISC Use as directed to check blood sugar once daily as needed.  Dx code: E11.9 100 each 3    Allergies as of 12/05/2023 - Review Complete 10/17/2023  Allergen Reaction Noted   Lisinopril  Cough 09/10/2016    Family History  Problem Relation Age of Onset   Ovarian cancer Mother    Ulcers Mother        stomach   Cancer Mother    Coronary artery disease Father 20       CABG X4   Hypertension Father    Stroke Father 28   Heart attack Father 46   Heart attack Sister 27   Heart Problems Paternal Aunt    Heart attack Paternal Aunt    Heart attack Paternal Uncle    Heart attack Paternal Uncle    Diabetes Neg Hx    Prostate cancer Neg Hx    Breast cancer Neg Hx    Colon  cancer Neg Hx    Depression Neg Hx    Alcohol abuse Neg Hx    Colon polyps Neg Hx    Stomach cancer Neg Hx    Rectal cancer Neg Hx    Esophageal cancer Neg Hx     Social History   Socioeconomic History   Marital status: Married    Spouse name: Not on file   Number of children: 1  Years of education: Not on file   Highest education level: Associate degree: academic program  Occupational History   Occupation: Self-employed  Tobacco Use   Smoking status: Never   Smokeless tobacco: Never  Vaping Use   Vaping status: Never Used  Substance and Sexual Activity   Alcohol use: No   Drug use: No   Sexual activity: Not Currently  Other Topics Concern   Not on file  Social History Narrative   Caffeine: 2-3 diet sodas/day   Lives with wife   1 grown son.   Occupation: Self-employed; administrator   Activity: some golf, limited by knee pain   Diet: good water, daily vegetables   Social Drivers of Corporate Investment Banker Strain: Low Risk  (10/03/2023)   Overall Financial Resource Strain (CARDIA)    Difficulty of Paying Living Expenses: Not hard at all  Food Insecurity: No Food Insecurity (10/03/2023)   Hunger Vital Sign    Worried About Running Out of Food in the Last Year: Never true    Ran Out of Food in the Last Year: Never true  Transportation Needs: No Transportation Needs (10/03/2023)   PRAPARE - Administrator, Civil Service (Medical): No    Lack of Transportation (Non-Medical): No  Physical Activity: Unknown (10/03/2023)   Exercise Vital Sign    Days of Exercise per Week: Patient declined    Minutes of Exercise per Session: Not on file  Stress: No Stress Concern Present (10/03/2023)   [employer] of Occupational Health - Occupational Stress Questionnaire    Feeling of Stress: Not at all  Social Connections: Socially Isolated (10/03/2023)   Social Connection and Isolation Panel    Frequency of Communication with Friends and  Family: Once a week    Frequency of Social Gatherings with Friends and Family: Once a week    Attends Religious Services: Patient declined    Database Administrator or Organizations: No    Attends Engineer, Structural: Not on file    Marital Status: Married  Catering Manager Violence: Not At Risk (10/06/2023)   Humiliation, Afraid, Rape, and Kick questionnaire    Fear of Current or Ex-Partner: No    Emotionally Abused: No    Physically Abused: No    Sexually Abused: No    Review of Systems: Positive for *** All other review of systems negative except as mentioned in the HPI.  Physical Exam: Vital signs There were no vitals taken for this visit.  General:   Alert,  Well-developed, well-nourished, pleasant and cooperative in NAD Lungs:  Clear throughout to auscultation.   Heart:  Regular rate and rhythm; no murmurs, clicks, rubs,  or gallops. Abdomen:  Soft, nontender and nondistended. Normal bowel sounds.   Neuro/Psych:  Alert and cooperative. Normal mood and affect. A and O x 3   @Mersedes Alber  CHARLENA Commander, MD, [facility] [phone] (pager) 01/22/2024 8:57 PM@

## 2024-01-23 ENCOUNTER — Other Ambulatory Visit: Payer: Self-pay

## 2024-01-23 ENCOUNTER — Ambulatory Visit (HOSPITAL_COMMUNITY): Admitting: Certified Registered Nurse Anesthetist

## 2024-01-23 ENCOUNTER — Ambulatory Visit (HOSPITAL_COMMUNITY)
Admission: RE | Admit: 2024-01-23 | Discharge: 2024-01-23 | Disposition: A | Discharge status: Home / Self Care | Attending: Internal Medicine | Admitting: Internal Medicine

## 2024-01-23 ENCOUNTER — Encounter (HOSPITAL_COMMUNITY)
Admission: RE | Disposition: A | Discharge status: Home / Self Care | Payer: Self-pay | Source: Home / Self Care | Attending: Internal Medicine

## 2024-01-23 ENCOUNTER — Encounter (HOSPITAL_COMMUNITY): Payer: Self-pay | Admitting: Internal Medicine

## 2024-01-23 DIAGNOSIS — K562 Volvulus: Secondary | ICD-10-CM

## 2024-01-23 DIAGNOSIS — Z860101 Personal history of adenomatous and serrated colon polyps: Secondary | ICD-10-CM

## 2024-01-23 DIAGNOSIS — Z1211 Encounter for screening for malignant neoplasm of colon: Secondary | ICD-10-CM

## 2024-01-23 DIAGNOSIS — K5730 Diverticulosis of large intestine without perforation or abscess without bleeding: Secondary | ICD-10-CM | POA: Diagnosis not present

## 2024-01-23 DIAGNOSIS — I10 Essential (primary) hypertension: Secondary | ICD-10-CM

## 2024-01-23 DIAGNOSIS — I2510 Atherosclerotic heart disease of native coronary artery without angina pectoris: Secondary | ICD-10-CM | POA: Diagnosis not present

## 2024-01-23 DIAGNOSIS — K648 Other hemorrhoids: Secondary | ICD-10-CM

## 2024-01-23 HISTORY — PX: COLONOSCOPY: SHX5424

## 2024-01-23 LAB — GLUCOSE, CAPILLARY: Glucose-Capillary: 114 mg/dL — ABNORMAL HIGH (ref 70–99)

## 2024-01-23 SURGERY — COLONOSCOPY
Anesthesia: Monitor Anesthesia Care

## 2024-01-23 MED ORDER — PROPOFOL 500 MG/50ML IV EMUL
INTRAVENOUS | Status: DC | PRN
  Administered 2024-01-23: 150 ug/kg/min via INTRAVENOUS

## 2024-01-23 MED ORDER — LIDOCAINE 2% (20 MG/ML) 5 ML SYRINGE
INTRAMUSCULAR | Status: DC | PRN
  Administered 2024-01-23: 40 mg via INTRAVENOUS

## 2024-01-23 MED ORDER — SODIUM CHLORIDE 0.9 % IV SOLN
INTRAVENOUS | Status: AC | PRN
  Administered 2024-01-23: 500 mL via INTRAMUSCULAR

## 2024-01-23 MED ORDER — PROPOFOL 10 MG/ML IV BOLUS
INTRAVENOUS | Status: DC | PRN
  Administered 2024-01-23: 30 mg via INTRAVENOUS
  Administered 2024-01-23: 20 mg via INTRAVENOUS

## 2024-01-23 MED ORDER — PROPOFOL 500 MG/50ML IV EMUL
INTRAVENOUS | Status: AC
  Filled 2024-01-23: qty 100

## 2024-01-23 NOTE — Discharge Instructions (Signed)
 No polyps today! I did see diverticulosis and internal hemorrhoids as before.  I suggest we regroup in 2 years to consider repeating a colonoscopy.  I appreciate the opportunity to care for you. Dennis CHARLENA Commander, MD, FACG  YOU HAD AN ENDOSCOPIC PROCEDURE TODAY: Refer to the procedure report and other information in the discharge instructions given to you for any specific questions about what was found during the examination. If this information does not answer your questions, please call Dr. Darilyn office at [phone] to clarify.   YOU SHOULD EXPECT: Some feelings of bloating in the abdomen. Passage of more gas than usual. Walking can help get rid of the air that was put into your GI tract during the procedure and reduce the bloating. If you had a lower endoscopy (such as a colonoscopy or flexible sigmoidoscopy) you may notice spotting of blood in your stool or on the toilet paper. Some abdominal soreness may be present for a day or two, also.  DIET: Your first meal following the procedure should be a light meal and then it is ok to progress to your normal diet. A half-sandwich or bowl of soup is an example of a good first meal. Heavy or fried foods are harder to digest and may make you feel nauseous or bloated. Drink plenty of fluids but you should avoid alcoholic beverages for 24 hours.   ACTIVITY: Your care partner should take you home directly after the procedure. You should plan to take it easy, moving slowly for the rest of the day. You can resume normal activity the day after the procedure however YOU SHOULD NOT DRIVE, use power tools, machinery or perform tasks that involve climbing or major physical exertion for 24 hours (because of the sedation medicines used during the test).   SYMPTOMS TO REPORT IMMEDIATELY: A gastroenterologist can be reached at any hour. Please call [phone]  for any of the following symptoms:  Following lower endoscopy (colonoscopy, flexible  sigmoidoscopy) Excessive amounts of blood in the stool  Significant tenderness, worsening of abdominal pains  Swelling of the abdomen that is new, acute  Fever of 100 or higher  FOLLOW UP:  If any biopsies were taken you will be contacted by phone or by letter within the next 1-3 weeks. Call [phone]  if you have not heard about the biopsies in 3 weeks.  Please also call with any specific questions about appointments or follow up tests.

## 2024-01-23 NOTE — Anesthesia Procedure Notes (Signed)
 Procedure Name: MAC Date/Time: 01/23/2024 8:10 AM  Performed by: Judythe Tanda Aran, CRNAPre-anesthesia Checklist: Patient identified, Emergency Drugs available, Suction available and Patient being monitored Patient Re-evaluated:Patient Re-evaluated prior to induction Oxygen Delivery Method: Simple face mask

## 2024-01-23 NOTE — Anesthesia Postprocedure Evaluation (Signed)
 Anesthesia Post Note  Patient: Dennis Ayers  Procedure(s) Performed: COLONOSCOPY     Patient location during evaluation: PACU Anesthesia Type: MAC Level of consciousness: awake and alert Pain management: pain level controlled Vital Signs Assessment: post-procedure vital signs reviewed and stable Respiratory status: spontaneous breathing, nonlabored ventilation, respiratory function stable and patient connected to nasal cannula oxygen Cardiovascular status: blood pressure returned to baseline and stable Postop Assessment: no apparent nausea or vomiting Anesthetic complications: no   No notable events documented.  Last Vitals:  Vitals:   01/23/24 0855 01/23/24 0859  BP: 114/71   Pulse: 85   Resp: 17 17  Temp:    SpO2: 95%     Last Pain:  Vitals:   01/23/24 0840  TempSrc: Temporal  PainSc: 0-No pain                 Lynwood MARLA Cornea

## 2024-01-23 NOTE — Op Note (Signed)
 [facility] Patient Name: Dennis Ayers Procedure Date: 01/23/2024 MRN: [id number] Attending MD: Lupita FORBES Commander , MD, [id number] Date of Birth: Sep 24, 1950 CSN: [id number] Age: 73 Admit Type: Outpatient Procedure:                Colonoscopy Indications:              High risk colon cancer surveillance: Personal                            history of colonic polyps - has colounic polyposis                            of uncertain etiology (CPUE) Providers:                Lupita CHARLENA Commander, MD, Ozell Pouch, Coye Bade, Technician Referring MD:              Medicines:                Monitored Anesthesia Care Complications:            No immediate complications. Estimated Blood Loss:     Estimated blood loss: none. Procedure:                Pre-Anesthesia Assessment:                           - Prior to the procedure, a History and Physical                            was performed, and patient medications and                            allergies were reviewed. The patient's tolerance of                            previous anesthesia was also reviewed. The risks                            and benefits of the procedure and the sedation                            options and risks were discussed with the patient.                            All questions were answered, and informed consent                            was obtained. Prior Anticoagulants: The patient has                            taken no anticoagulant or antiplatelet agents. ASA  Grade Assessment: III - A patient with severe                            systemic disease. After reviewing the risks and                            benefits, the patient was deemed in satisfactory                            condition to undergo the procedure.                           After obtaining informed consent, the colonoscope                            was passed under  direct vision. Throughout the                            procedure, the patient's blood pressure, pulse, and                            oxygen saturations were monitored continuously. The                            CF-HQ190L ([id number]) Olympus colonoscope was                            introduced through the anus and advanced to the the                            cecum, identified by appendiceal orifice and                            ileocecal valve. The colonoscopy was somewhat                            difficult due to significant looping. Successful                            completion of the procedure was aided by RUQ/RLQ                            pressure and abdominal binder. The patient                            tolerated the procedure well. The quality of the                            bowel preparation was good. The ileocecal valve,                            appendiceal orifice, and rectum were photographed.  The bowel preparation used was SUPREP via split                            dose instruction. Scope In: 8:15:31 AM Scope Out: 8:32:57 AM Scope Withdrawal Time: 0 hours 10 minutes 47 seconds  Total Procedure Duration: 0 hours 17 minutes 26 seconds  Findings:      The perianal and digital rectal examinations were normal. Pertinent       negatives include normal prostate (size, shape, and consistency).      Multiple diverticula were found in the sigmoid colon.      Internal hemorrhoids were found.      The exam was otherwise without abnormality on direct and retroflexion       views. Impression:               - Diverticulosis in the sigmoid colon.                           - Internal hemorrhoids.                           - The examination was otherwise normal on direct                            and retroflexion views.                           - No specimens collected.                           - Personal history of colonic polyps.                            -08/20/2011 - all diminutive 3 serrated adenomas and                            1 tub adenoma - routine repeat colon                           07/22/2015 12 polyps removed 5-15 mm and one flat 25                            mm polyp left in cecum - will discuss options for                            removal 10 adenomas or ssp - will attempt                            colonoscopy removal of the 25mm cecal polyp at                            hospital                           09/10/15 25 mm flat/sessile tubular adenoma removed  from cecum and small adenoma from ascending colon -                            recall                           10/2016 diminutive cecal adenoma                           11/16/2017 5 diminutive polyps removed - adenomas                            and ssp's                           22 total lifetime referred to genetics - testing                            negative                           12/2019 13 polyps mostly adenomas and ssp's w/ some                            hyperplastic                           December 2022 incomplete exam no polyps                           01/26/2022 1-2 mm cecal polyp removed (abdominal                            binder used) adenoma Moderate Sedation:      Not Applicable - Patient had care per Anesthesia. Recommendation:           - Patient has a contact number available for                            emergencies. The signs and symptoms of potential                            delayed complications were discussed with the                            patient. Return to normal activities tomorrow.                            Written discharge instructions were provided to the                            patient.                           - Resume previous diet.                           -  Continue present medications.                           - Office visit recall 2 years to consider repeat                             colonoscopy                           Procedures at hospital due to difficult airway Procedure Code(s):        --- Professional ---                           G0105, Colorectal cancer screening; colonoscopy on                            individual at high risk Diagnosis Code(s):        --- Professional ---                           Z86.010, Personal history of colonic polyps                           K64.8, Other hemorrhoids                           K57.30, Diverticulosis of large intestine without                            perforation or abscess without bleeding CPT copyright 2022 American Medical Association. All rights reserved. The codes documented in this report are preliminary and upon coder review may  be revised to meet current compliance requirements. Lupita FORBES Commander, MD 01/23/2024 8:43:40 AM This report has been signed electronically. Number of Addenda: 0

## 2024-01-23 NOTE — Anesthesia Preprocedure Evaluation (Addendum)
 Anesthesia Evaluation  Patient identified by MRN, date of birth, ID band Patient awake    Reviewed: Allergy & Precautions, NPO status , Patient's Chart, lab work & pertinent test results, reviewed documented beta blocker date and time   History of Anesthesia Complications Negative for: history of anesthetic complications  Airway Mallampati: II  TM Distance: >3 FB     Dental no notable dental hx.    Pulmonary sleep apnea , neg COPD   breath sounds clear to auscultation       Cardiovascular hypertension, + CAD   Rhythm:Regular Rate:Normal  Study Conclusions   - Left ventricle: The cavity size was normal. There was mild    concentric hypertrophy. Systolic function was normal. The    estimated ejection fraction was in the range of 55% to 60%. Wall    motion was normal; there were no regional wall motion    abnormalities. Left ventricular diastolic function parameters    were normal.  - Aortic root: 4.0 cm.  - Left atrium: The atrium was mildly dilated.  - Pulmonary arteries: Systolic pressure could not be accurately    estimated.     Neuro/Psych neg Seizures    GI/Hepatic ,,,(+) Hepatitis -  Endo/Other  diabetes, Type 2  Class 4 obesity  Renal/GU Renal disease     Musculoskeletal  (+) Arthritis , Osteoarthritis,    Abdominal   Peds  Hematology   Anesthesia Other Findings   Reproductive/Obstetrics                              Anesthesia Physical Anesthesia Plan  ASA: 3  Anesthesia Plan: MAC   Post-op Pain Management:    Induction: Intravenous  PONV Risk Score and Plan: 1 and Ondansetron  and Propofol  infusion  Airway Management Planned: Natural Airway and Simple Face Mask  Additional Equipment:   Intra-op Plan:   Post-operative Plan:   Informed Consent: I have reviewed the patients History and Physical, chart, labs and discussed the procedure including the risks, benefits  and alternatives for the proposed anesthesia with the patient or authorized representative who has indicated his/her understanding and acceptance.     Dental advisory given  Plan Discussed with: CRNA  Anesthesia Plan Comments:         Anesthesia Quick Evaluation

## 2024-01-23 NOTE — Transfer of Care (Signed)
 Immediate Anesthesia Transfer of Care Note  Patient: Dennis Ayers  Procedure(s) Performed: COLONOSCOPY  Patient Location: Endoscopy Unit  Anesthesia Type:MAC  Level of Consciousness: awake  Airway & Oxygen Therapy: Patient Spontanous Breathing and Patient connected to face mask  Post-op Assessment: Report given to RN and Post -op Vital signs reviewed and stable  Post vital signs: Reviewed and stable  Last Vitals:  Vitals Value Taken Time  BP    Temp    Pulse 81 01/23/24 08:39  Resp 12 01/23/24 08:39  SpO2 95 % 01/23/24 08:39  Vitals shown include unfiled device data.  Last Pain:  Vitals:   01/23/24 0756  TempSrc: Temporal  PainSc: 0-No pain         Complications: No notable events documented.

## 2024-01-24 ENCOUNTER — Encounter (HOSPITAL_COMMUNITY): Payer: Self-pay | Admitting: Internal Medicine

## 2024-02-08 ENCOUNTER — Encounter: Payer: Self-pay | Admitting: Family Medicine

## 2024-02-08 DIAGNOSIS — E118 Type 2 diabetes mellitus with unspecified complications: Secondary | ICD-10-CM

## 2024-02-10 MED ORDER — ACCU-CHEK GUIDE TEST VI STRP: ORAL_STRIP | 3 refills | Status: AC

## 2024-02-10 MED ORDER — ACCU-CHEK GUIDE ME W/DEVICE KIT: PACK | 0 refills | Status: AC

## 2024-02-10 MED ORDER — ACCU-CHEK SOFTCLIX LANCETS MISC: 3 refills | Status: AC

## 2024-02-10 NOTE — Telephone Encounter (Signed)
 Sent rxs for [employer], Accu-chek Guide test strips and Accu-chek Softclix lancets to [facility].   Fyi to Dr KANDICE.

## 2024-02-28 NOTE — Progress Notes (Signed)
 ABHI MOCCIA                                          MRN: [id number]   02/28/2024   The [facility] Quality Team Specialist reviewed this patient medical record for the purposes of chart review for care gap closure. The following were reviewed: abstraction for care gap closure-glycemic status assessment.    [facility] Quality Team

## 2024-03-14 ENCOUNTER — Encounter: Payer: Self-pay | Admitting: Family Medicine

## 2024-03-14 DIAGNOSIS — H9191 Unspecified hearing loss, right ear: Secondary | ICD-10-CM

## 2024-03-16 DIAGNOSIS — H9191 Unspecified hearing loss, right ear: Secondary | ICD-10-CM | POA: Insufficient documentation

## 2024-03-28 ENCOUNTER — Encounter: Payer: Self-pay | Admitting: Family Medicine

## 2024-04-18 ENCOUNTER — Ambulatory Visit: Admitting: Family Medicine

## 2024-10-09 ENCOUNTER — Ambulatory Visit

## 2024-10-10 ENCOUNTER — Other Ambulatory Visit

## 2024-10-17 ENCOUNTER — Encounter: Admitting: Family Medicine
